# Patient Record
Sex: Male | Born: 1940 | Race: White | Hispanic: No | Marital: Single | Smoking: Never smoker
Health system: Southern US, Community
[De-identification: ages and names within clinical notes are randomized; demographics above are authoritative.]

## PROBLEM LIST (undated history)

## (undated) DIAGNOSIS — E119 Type 2 diabetes mellitus without complications: Secondary | ICD-10-CM

## (undated) DIAGNOSIS — E7800 Pure hypercholesterolemia, unspecified: Secondary | ICD-10-CM

## (undated) DIAGNOSIS — I10 Essential (primary) hypertension: Secondary | ICD-10-CM

## (undated) DIAGNOSIS — C189 Malignant neoplasm of colon, unspecified: Secondary | ICD-10-CM

## (undated) DIAGNOSIS — K635 Polyp of colon: Secondary | ICD-10-CM

## (undated) DIAGNOSIS — L309 Dermatitis, unspecified: Secondary | ICD-10-CM

## (undated) DIAGNOSIS — N189 Chronic kidney disease, unspecified: Secondary | ICD-10-CM

## (undated) DIAGNOSIS — M1990 Unspecified osteoarthritis, unspecified site: Secondary | ICD-10-CM

## (undated) HISTORY — DX: Chronic kidney disease, unspecified: N18.9

## (undated) HISTORY — DX: Dermatitis, unspecified: L30.9

## (undated) HISTORY — DX: Malignant neoplasm of colon, unspecified: C18.9

## (undated) HISTORY — DX: Polyp of colon: K63.5

## (undated) HISTORY — DX: Type 2 diabetes mellitus without complications: E11.9

## (undated) HISTORY — DX: Pure hypercholesterolemia, unspecified: E78.00

## (undated) HISTORY — DX: Unspecified osteoarthritis, unspecified site: M19.90

## (undated) HISTORY — PX: COLON SURGERY: SHX602

## (undated) HISTORY — DX: Essential (primary) hypertension: I10

## (undated) SURGERY — Surgical Case
Anesthesia: *Unknown

---
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
## 2013-09-29 HISTORY — PX: COLONOSCOPY: SHX174

## 2014-03-25 ENCOUNTER — Encounter: Payer: Self-pay | Admitting: Internal Medicine

## 2014-03-25 ENCOUNTER — Ambulatory Visit (INDEPENDENT_AMBULATORY_CARE_PROVIDER_SITE_OTHER): Payer: Medicare Other | Admitting: Internal Medicine

## 2014-03-25 VITALS — BP 116/68 | HR 63 | Temp 97.4°F | Ht 65.0 in | Wt 189.2 lb

## 2014-03-25 DIAGNOSIS — D126 Benign neoplasm of colon, unspecified: Secondary | ICD-10-CM

## 2014-03-25 DIAGNOSIS — I10 Essential (primary) hypertension: Secondary | ICD-10-CM | POA: Insufficient documentation

## 2014-03-25 DIAGNOSIS — M549 Dorsalgia, unspecified: Secondary | ICD-10-CM

## 2014-03-25 DIAGNOSIS — E1122 Type 2 diabetes mellitus with diabetic chronic kidney disease: Secondary | ICD-10-CM | POA: Insufficient documentation

## 2014-03-25 DIAGNOSIS — Z85038 Personal history of other malignant neoplasm of large intestine: Secondary | ICD-10-CM | POA: Insufficient documentation

## 2014-03-25 DIAGNOSIS — D509 Iron deficiency anemia, unspecified: Secondary | ICD-10-CM | POA: Insufficient documentation

## 2014-03-25 DIAGNOSIS — N402 Nodular prostate without lower urinary tract symptoms: Secondary | ICD-10-CM

## 2014-03-25 DIAGNOSIS — E118 Type 2 diabetes mellitus with unspecified complications: Secondary | ICD-10-CM

## 2014-03-25 DIAGNOSIS — E1169 Type 2 diabetes mellitus with other specified complication: Secondary | ICD-10-CM | POA: Insufficient documentation

## 2014-03-25 DIAGNOSIS — E785 Hyperlipidemia, unspecified: Secondary | ICD-10-CM | POA: Insufficient documentation

## 2014-03-25 DIAGNOSIS — K5900 Constipation, unspecified: Secondary | ICD-10-CM | POA: Insufficient documentation

## 2014-03-25 DIAGNOSIS — N182 Chronic kidney disease, stage 2 (mild): Secondary | ICD-10-CM

## 2014-03-25 MED ORDER — SIMVASTATIN 80 MG PO TABS: 80.0000 mg | ORAL_TABLET | Freq: Every day | ORAL | Status: DC

## 2014-03-25 MED ORDER — GLIPIZIDE ER 5 MG PO TB24: 5.0000 mg | ORAL_TABLET | Freq: Every day | ORAL | Status: DC

## 2014-03-25 MED ORDER — LISINOPRIL 5 MG PO TABS: 5.0000 mg | ORAL_TABLET | Freq: Every day | ORAL | Status: DC

## 2014-03-25 MED ORDER — METFORMIN HCL 1000 MG PO TABS: 1000.0000 mg | ORAL_TABLET | Freq: Two times a day (BID) | ORAL | Status: DC

## 2014-03-25 MED ORDER — DOCUSATE SODIUM 100 MG PO CAPS: 100.0000 mg | ORAL_CAPSULE | Freq: Every day | ORAL | Status: DC

## 2014-03-25 NOTE — Progress Notes (Signed)
Patient ID: Justin [name], male   DOB: 05-12-1941, 73 y.o.   MRN: [id number]    Chief Complaint  Patient presents with  . Establish Care    NP  . other    back pain x 2-3 weeks, using Bengay oinment with little relief  . other    need a referral for Oncologist & GI   Allergies not on file  HPI 73 y/o male pt is here to establish care. He was seeing Dr Lennie Odor in [facility] in [place], [place] prior to this and has recently moved to [place]. 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 He also has tubular adenoma with high grade dysplasia on colonoscopy of 02/13/14 and needs a flexible sigmoidoscopy in 3-6 months from 02/13/14. Reviewed note fro Dr Kathleene Hazel, GI in [place] his sister is his POA and he lives with her who has also moved from [place]. She is here this visit. She will be seeing Dr Mariea Clonts to establish care and would like to know if Dr Mariea Clonts could be the patient's PCP as well to make co-ordination of care easier.  He has history of HTN, hyperlipidemia, DM and developmental disability. He would need to establish care with an oncologist in town He has been having discomfort in his back for few weeks. He has been doing a lot of gardening recently. Denies any trauma Had house visit call visit recently and needs dilated eye exam. Does not have an ophthalmologist in town  Review of Systems  Constitutional: Negative for fever, chills, malaise/fatigue and diaphoresis.  HENT: Negative for congestion.   Eyes: Negative for blurred vision, double vision and discharge.  Respiratory: Negative for cough, sputum production, shortness of breath and wheezing.   Cardiovascular: Negative for chest pain, palpitations, orthopnea and leg swelling.  Gastrointestinal: Negative for heartburn, nausea, vomiting, abdominal pain  Genitourinary: Negative for dysuria, urgency, frequency and flank pain.  Musculoskeletal: Negative for falls, joint pain  and myalgias.  Skin: Negative for itching and rash.  Neurological: Negative for dizziness, tingling, focal weakness and headaches.  Psychiatric/Behavioral: Negative for depression   Past Medical History  Diagnosis Date  . Diabetes mellitus without complication   . Hypertension   . Cancer     colon   Past Surgical History  Procedure Laterality Date  . Colon surgery  2014 & 2015    resection of colon & removal of poylp ; Dr Kathleene Hazel, Dr Sherlene Shams  . External ear surgery  1954  . Colonoscopy  09/29/2013   Family History  Problem Relation Age of Onset  . Varicose Veins Mother   . Stroke Mother   . Glaucoma Mother   . Kidney disease Mother   . Hypertension Father   . Heart attack Father   . Cancer Brother     testicular cancer   History   Social History  . Marital Status: Single    Spouse Name: N/A    Number of Children: N/A  . Years of Education: N/A   Occupational History  . Not on file.   Social History Main Topics  . Smoking status: Never Smoker   . Smokeless tobacco: Never Used  . Alcohol Use: No  . Drug Use: No  . Sexual Activity: Not on file   Other Topics Concern  . Not on file   Social History Narrative   Single, lives in 2 stories home with 3 people and 1 dog.     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   No current facility-administered medications on file prior to visit.   Physical exam BP 116/68  Pulse 63  Temp(Src) 97.4 F (36.3 C) (Oral)  Ht 5\' 5"  (1.651 m)  Wt 189 lb 3.2 oz (85.821 kg)  BMI 31.48 kg/m2  SpO2 98%  General- elderly male in no acute distress Head- atraumatic, normocephalic Eyes- PERRLA, EOMI, no pallor, no icterus, no discharge Ears- left ear normal tympanic membrane and normal external ear canal , right ear normal tympanic membrane and normal external ear canal, some cerumen in both ears Neck- no  thyromegaly, no jugular vein distension Cardiovascular- normal s1,s2, no murmurs, normal distal pulses Respiratory- bilateral clear to auscultation, no wheeze, no rhonchi, no crackles Abdomen- bowel sounds present, soft, non tender Musculoskeletal- able to move all 4 extremities, no spinal and paraspinal tenderness, steady gait, no use of assistive device, normal range of motion, no leg edema Neurological- no focal deficit, normal reflexes, normal muscle strength, normal sensation to fine touch and vibration Skin- warm and dry Psychiatry- alert and oriented to person, place and time, normal mood and affect  Assessment/plan  1. Mid back pain Patient has not taken anything for pain. Appears to be musculoskeletal. Advised to take tylenol prn for now for pain, if no improvement will reassess. Rest and prn ice pack also recommended  2. Constipation, unspecified constipation type Colace for now as current regimen  3. Type 2 diabetes mellitus with complication Continue his metformin for now with statin and ACEI - Hemoglobin A1c; Future - Lipid Panel; Future - CMP; Future - Ambulatory referral to Ophthalmology  4. Anemia, iron deficiency Monitor h&h. Continue ferrous sulfate and stool softener - CBC with Differential; Future  5. Essential hypertension, benign Stable. Continue lisinopril 5 mg daiy  6. Other and unspecified hyperlipidemia Continue zocor for now - Lipid Panel; Future  7. Tubular adenoma of colon - Ambulatory referral to Oncology - Ambulatory referral to Gastroenterology  8. Personal history of colon cancer, stage II Oncology and gi referral provided. Reviewed hemeonc note Dr Kris Hartmann 01/13/14. Recent CEA abd ct scan was normal. A repeat recommended in a year and sigmoidoscopy in 3-6 months from 7/15  9. Prostate nodule without urinary obstruction - PSA; Future - might need urology referral next visit  All of his and his sister's questions were answered

## 2014-03-25 NOTE — Patient Instructions (Signed)
Take tylenol 650 mg every 8 hours as need for pain in the back with heat pack. Avoid gardening for now. If no imporvement with rest and tylenol please let us know

## 2014-03-26 ENCOUNTER — Telehealth: Payer: Self-pay | Admitting: Internal Medicine

## 2014-03-26 NOTE — Telephone Encounter (Signed)
Pt sister establish care with Dr. Bubba Camp.  Says she discussed with Dr. Bubba Camp switching care with Dr. Mariea Clonts.  Says she is a patient of Dr. Mariea Clonts as well as her husband. Dr. Bubba Camp and Dr. Mariea Clonts agreed to the  change.  Dr. Bubba Camp also agreed to perform a CPE on pt.  Dr. Mariea Clonts did not have availability on her schedule.    cdavis

## 2014-03-31 ENCOUNTER — Other Ambulatory Visit: Payer: Self-pay | Admitting: *Deleted

## 2014-03-31 ENCOUNTER — Encounter: Payer: Self-pay | Admitting: Gastroenterology

## 2014-03-31 MED ORDER — GLUCOSE BLOOD VI STRP: ORAL_STRIP | Status: DC

## 2014-04-10 ENCOUNTER — Encounter: Payer: Self-pay | Admitting: Gastroenterology

## 2014-04-10 ENCOUNTER — Telehealth: Payer: Self-pay | Admitting: *Deleted

## 2014-04-10 ENCOUNTER — Ambulatory Visit (INDEPENDENT_AMBULATORY_CARE_PROVIDER_SITE_OTHER): Payer: Medicare Other | Admitting: Gastroenterology

## 2014-04-10 VITALS — BP 100/60 | HR 72 | Ht 64.0 in | Wt 186.0 lb

## 2014-04-10 DIAGNOSIS — Z86010 Personal history of colonic polyps: Secondary | ICD-10-CM

## 2014-04-10 DIAGNOSIS — Z85038 Personal history of other malignant neoplasm of large intestine: Secondary | ICD-10-CM

## 2014-04-10 DIAGNOSIS — C189 Malignant neoplasm of colon, unspecified: Secondary | ICD-10-CM

## 2014-04-10 NOTE — Telephone Encounter (Signed)
LMOM to return call.

## 2014-04-10 NOTE — Telephone Encounter (Signed)
There was some cerumen/wax in both ears, i had advised on using debrox ear drop for now

## 2014-04-10 NOTE — Patient Instructions (Addendum)
You have been scheduled for a flexible sigmoidoscopy. Please follow the written instructions given to you at your visit today. If you use inhalers (even only as needed), please bring them with you on the day of your procedure.  

## 2014-04-10 NOTE — Telephone Encounter (Signed)
Patient called and stated that he was suppose to be getting a referral to ENT Dr. For Wax buildup. Please Advise.

## 2014-04-11 ENCOUNTER — Encounter: Payer: Self-pay | Admitting: Gastroenterology

## 2014-04-11 DIAGNOSIS — C189 Malignant neoplasm of colon, unspecified: Secondary | ICD-10-CM | POA: Insufficient documentation

## 2014-04-11 NOTE — Progress Notes (Signed)
Reviewed and agree with management. Khandi Kernes D. Mischelle Reeg, M.D., FACG  

## 2014-04-11 NOTE — Progress Notes (Signed)
04/11/2014 Justin [name] [id number] 08/11/1940   HISTORY OF PRESENT ILLNESS:  This is a 73 year old male with past medical history of hypertension, hyperlipidemia, diabetes, and developmental disability who is new to our practice and recently moved here from [place] to live with his sister. The patient underwent colonoscopy on 06/04/2013, which revealed a large lobular tumor at 45 cm. The mass was nearly obstructing the entire lumen of the bowel and he ended up having a laparoscopic splenic flexure mobilization and laparoscopic converted to an open sigmoid colectomy with repair of incarcerated umbilical hernia on 42/68/3419. 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 14 lymph nodes were examined and 0 were involved. Diagnosed with Stage 2 colon cancer.  He subsequently underwent a colonoscopy on 12/24/2013, which revealed a 10 mm polyp 10 cm from the anus which was biopsied and showed fragments of tubulovillous adenoma with erosion. Colonoscopy was repeated on January 10, 2014 and the same area was identified and measured 10-14 mm, an appearance consistent with villous sessile adenoma. Pathology actually showed fragments of adenoma with focal high-grade dysplasia with no concern for positive margins. The plan per his oncologist and his gastroenterologist in [place] was to proceed with repeat flexible sigmoidoscopy in 3 months from the last. He is here today to establish GI care and to schedule that flexible sigmoidoscopy. He'll also be establishing care with an oncologist as well.  He denies any complaints.      Past Medical History  Diagnosis Date  . Diabetes mellitus without complication   . Hypertension   . Colon cancer   . Hypercholesteremia   . Colon polyps     adenomatous  . Arthritis    Past Surgical History  Procedure Laterality Date  . Colon surgery  2014 & 2015    resection of colon  & removal of poylp ; Dr Kathleene Hazel, Dr Sherlene Shams  . External ear surgery  1954  . Colonoscopy  09/29/2013    reports that he has never smoked. He has never used smokeless tobacco. He reports that he drinks alcohol. He reports that he does not use illicit drugs. family history includes Cancer in his brother; Glaucoma in his mother; Heart attack in his father; Hypertension in his father; Kidney disease in his mother; Stroke in his mother; Varicose Veins in his mother. No Known Allergies    Outpatient Encounter Prescriptions as of 04/10/2014  Medication Sig  . docusate sodium (COLACE) 100 MG capsule Take 1 capsule (100 mg total) by mouth daily.  . ferrous sulfate 325 (65 FE) MG EC tablet Take 65 mg by mouth daily.  Marland Kitchen glipiZIDE (GLUCOTROL XL) 5 MG 24 hr tablet Take 1 tablet (5 mg total) by mouth daily with breakfast.  . glucose blood test strip One Touch Ultra Mini Test Strips Sig:Use as instructed: once daily for Diabetes Dx 250.00  . lisinopril (PRINIVIL,ZESTRIL) 5 MG tablet Take 1 tablet (5 mg total) by mouth daily.  . metFORMIN (GLUCOPHAGE) 1000 MG tablet Take 1 tablet (1,000 mg total) by mouth 2 (two) times daily with a meal.  . simvastatin (ZOCOR) 80 MG tablet Take 1 tablet (80 mg total) by mouth daily. In there evening for hyperlipedemia     REVIEW OF SYSTEMS  : All other systems reviewed and negative except where noted in the History of Present Illness.   PHYSICAL EXAM: BP 100/60  Pulse 72  Ht 5\' 4"  (1.626 m)  Wt 186  lb (84.369 kg)  BMI 31.91 kg/m2 General: Well developed white male in no acute distress Head: Normocephalic and atraumatic Eyes:  Sclerae anicteric, conjunctiva pink. Ears: Normal auditory acuity  Lungs: Clear throughout to auscultation Heart: Regular rate and rhythm Abdomen: Soft, non-distended.  Normal bowel sounds.  Non-tender.  Well healed laparotomy scar noted. Rectal:  Deferred.  Will be done at the time of colonoscopy. Musculoskeletal: Symmetrical with no  gross deformities  Skin: No lesions on visible extremities Extremities: No edema  Neurological: Alert oriented x 4, grossly non-focal Psychological:  Alert and cooperative. Normal mood and affect  ASSESSMENT AND PLAN: -Personal history of colon cancer diagnosed in 06/2013 s/p and personal history of colon polyps with focal high grade dysplasia since that time.  Will schedule flexible sigmoidoscopy at the recommendation of his previous gastroenterologist in [place].  We will give him a Miralax prep due to his inability to give himself enemas due to poor manual dexterity.    *A significant amount of time, > 30 minutes was spent reviewing records.

## 2014-04-14 NOTE — Telephone Encounter (Signed)
Patient Caregiver Notified and will try this.

## 2014-04-18 ENCOUNTER — Encounter: Payer: Self-pay | Admitting: Gastroenterology

## 2014-05-12 ENCOUNTER — Other Ambulatory Visit: Payer: Medicare Other

## 2014-05-12 DIAGNOSIS — E118 Type 2 diabetes mellitus with unspecified complications: Secondary | ICD-10-CM

## 2014-05-12 DIAGNOSIS — E785 Hyperlipidemia, unspecified: Secondary | ICD-10-CM

## 2014-05-12 DIAGNOSIS — D509 Iron deficiency anemia, unspecified: Secondary | ICD-10-CM

## 2014-05-12 DIAGNOSIS — N402 Nodular prostate without lower urinary tract symptoms: Secondary | ICD-10-CM

## 2014-05-13 LAB — CBC WITH DIFFERENTIAL/PLATELET
BASOS: 0 %
Basophils Absolute: 0 10*3/uL (ref 0.0–0.2)
EOS ABS: 0.2 10*3/uL (ref 0.0–0.4)
Eos: 3 %
HCT: 40.8 % (ref 37.5–51.0)
Hemoglobin: 13.6 g/dL (ref 12.6–17.7)
IMMATURE GRANS (ABS): 0 10*3/uL (ref 0.0–0.1)
IMMATURE GRANULOCYTES: 0 %
LYMPHS: 26 %
Lymphocytes Absolute: 1.9 10*3/uL (ref 0.7–3.1)
MCH: 28.8 pg (ref 26.6–33.0)
MCHC: 33.3 g/dL (ref 31.5–35.7)
MCV: 86 fL (ref 79–97)
Monocytes Absolute: 0.8 10*3/uL (ref 0.1–0.9)
Monocytes: 11 %
NEUTROS PCT: 60 %
Neutrophils Absolute: 4.3 10*3/uL (ref 1.4–7.0)
RBC: 4.72 x10E6/uL (ref 4.14–5.80)
RDW: 14.2 % (ref 12.3–15.4)
WBC: 7.3 10*3/uL (ref 3.4–10.8)

## 2014-05-13 LAB — LIPID PANEL
CHOLESTEROL TOTAL: 151 mg/dL (ref 100–199)
Chol/HDL Ratio: 3.6 ratio units (ref 0.0–5.0)
HDL: 42 mg/dL (ref >39)
LDL Calculated: 69 mg/dL (ref 0–99)
TRIGLYCERIDES: 201 mg/dL — AB (ref 0–149)
VLDL CHOLESTEROL CAL: 40 mg/dL (ref 5–40)

## 2014-05-13 LAB — COMPREHENSIVE METABOLIC PANEL
ALK PHOS: 85 IU/L (ref 39–117)
ALT: 19 IU/L (ref 0–44)
AST: 20 IU/L (ref 0–40)
Albumin/Globulin Ratio: 2.2 (ref 1.1–2.5)
Albumin: 4.6 g/dL (ref 3.5–4.8)
BILIRUBIN TOTAL: 0.9 mg/dL (ref 0.0–1.2)
BUN / CREAT RATIO: 20 (ref 10–22)
BUN: 27 mg/dL (ref 8–27)
CO2: 25 mmol/L (ref 18–29)
CREATININE: 1.32 mg/dL — AB (ref 0.76–1.27)
Calcium: 9.4 mg/dL (ref 8.6–10.2)
Chloride: 99 mmol/L (ref 97–108)
GFR calc non Af Amer: 53 mL/min/{1.73_m2} — ABNORMAL LOW (ref >59)
GFR, EST AFRICAN AMERICAN: 61 mL/min/{1.73_m2} (ref >59)
GLOBULIN, TOTAL: 2.1 g/dL (ref 1.5–4.5)
Glucose: 122 mg/dL — ABNORMAL HIGH (ref 65–99)
Potassium: 5.3 mmol/L — ABNORMAL HIGH (ref 3.5–5.2)
Sodium: 137 mmol/L (ref 134–144)
Total Protein: 6.7 g/dL (ref 6.0–8.5)

## 2014-05-13 LAB — HEMOGLOBIN A1C
Est. average glucose Bld gHb Est-mCnc: 140 mg/dL
HEMOGLOBIN A1C: 6.5 % — AB (ref 4.8–5.6)

## 2014-05-13 LAB — PSA: PSA: 4.9 ng/mL — ABNORMAL HIGH (ref 0.0–4.0)

## 2014-05-14 ENCOUNTER — Encounter: Payer: Medicare Other | Admitting: Internal Medicine

## 2014-05-19 ENCOUNTER — Encounter: Payer: Medicare Other | Admitting: Internal Medicine

## 2014-05-30 ENCOUNTER — Encounter: Payer: Self-pay | Admitting: Internal Medicine

## 2014-05-30 ENCOUNTER — Ambulatory Visit (INDEPENDENT_AMBULATORY_CARE_PROVIDER_SITE_OTHER): Payer: Medicare Other | Admitting: Internal Medicine

## 2014-05-30 VITALS — BP 100/60 | HR 74 | Temp 97.9°F | Ht 64.0 in | Wt 192.0 lb

## 2014-05-30 DIAGNOSIS — N402 Nodular prostate without lower urinary tract symptoms: Secondary | ICD-10-CM

## 2014-05-30 DIAGNOSIS — E118 Type 2 diabetes mellitus with unspecified complications: Secondary | ICD-10-CM

## 2014-05-30 DIAGNOSIS — D126 Benign neoplasm of colon, unspecified: Secondary | ICD-10-CM

## 2014-05-30 DIAGNOSIS — Z85038 Personal history of other malignant neoplasm of large intestine: Secondary | ICD-10-CM

## 2014-05-30 DIAGNOSIS — Z0000 Encounter for general adult medical examination without abnormal findings: Secondary | ICD-10-CM

## 2014-05-30 DIAGNOSIS — M549 Dorsalgia, unspecified: Secondary | ICD-10-CM

## 2014-05-30 DIAGNOSIS — D229 Melanocytic nevi, unspecified: Secondary | ICD-10-CM

## 2014-05-30 DIAGNOSIS — I10 Essential (primary) hypertension: Secondary | ICD-10-CM

## 2014-05-30 NOTE — Progress Notes (Signed)
Patient ID: Justin [name], male   DOB: [birth year], 73 y.o.   MRN: [id number]   Location:  [facility]  Code Status: has hcpoa on file  No Known Allergies  Chief Complaint  Patient presents with  . Annual Exam    yearly physical, Discuss labs (Copy printed)  . Immunizations    Discuss having the Flu vaccine    HPI: Patient is a 73 y.o. male seen in the office today for   MMSE was done. Failed clock drawing.  25/30.    Wants ears cleaned out.    Has sigmoidoscopy on Monday.  Last dec, had colon surgery for colon cancer.  Was very slow in coming back to normal mentally and with his GI system.    Has a mole on his back.    No pain.    Right shoulder makes knocking sound.  Review of Systems:  Review of Systems  Constitutional: Negative for fever and chills.  HENT: Positive for hearing loss.        Cerumen impaction  Eyes: Negative for blurred vision.       Has astigmatism and "whiteness" of left eye  Respiratory: Negative for cough and shortness of breath.   Cardiovascular: Negative for chest pain and leg swelling.  Gastrointestinal: Positive for constipation. Negative for abdominal pain.  Genitourinary: Negative for dysuria, urgency and frequency.  Musculoskeletal: Positive for back pain. Negative for falls and myalgias.       Right shoulder pops out of joint  Skin: Negative for rash.       Mole in middle of back  Neurological: Negative for dizziness, loss of consciousness, weakness and headaches.  Endo/Heme/Allergies: Does not bruise/bleed easily.  Psychiatric/Behavioral: Negative for depression and memory loss.       Has developmental delay     Past Medical History  Diagnosis Date  . Diabetes mellitus without complication   . Hypertension   . Colon cancer   . Hypercholesteremia   . Colon polyps     adenomatous  . Arthritis     Past Surgical History  Procedure Laterality Date  . Colon surgery  2014 & 2015   resection of colon & removal of poylp ; Dr Kathleene Hazel, Dr Sherlene Shams  . External ear surgery  1954  . Colonoscopy  09/29/2013    Social History:   reports that he has never smoked. He has never used smokeless tobacco. He reports that he drinks alcohol. He reports that he does not use illicit drugs.  Family History  Problem Relation Age of Onset  . Varicose Veins Mother   . Stroke Mother   . Glaucoma Mother   . Kidney disease Mother   . Hypertension Father   . Heart attack Father   . Cancer Brother     testicular cancer    Medications: Patient's Medications  New Prescriptions   No medications on file  Previous Medications   DOCUSATE SODIUM (COLACE) 100 MG CAPSULE    Take 1 capsule (100 mg total) by mouth daily.   FERROUS SULFATE 325 (65 FE) MG EC TABLET    Take 65 mg by mouth daily.   GLIPIZIDE (GLUCOTROL XL) 5 MG 24 HR TABLET    Take 1 tablet (5 mg total) by mouth daily with breakfast.   GLUCOSE BLOOD TEST STRIP    One Touch Ultra Mini Test Strips Sig:Use as instructed: once daily for Diabetes Dx 250.00   LISINOPRIL (PRINIVIL,ZESTRIL) 5 MG TABLET  Take 1 tablet (5 mg total) by mouth daily.   METFORMIN (GLUCOPHAGE) 1000 MG TABLET    Take 1 tablet (1,000 mg total) by mouth 2 (two) times daily with a meal.   SIMVASTATIN (ZOCOR) 80 MG TABLET    Take 1 tablet (80 mg total) by mouth daily. In there evening for hyperlipedemia  Modified Medications   No medications on file  Discontinued Medications   No medications on file     Physical Exam: Filed Vitals:   05/30/14 1027  BP: 100/60  Pulse: 74  Temp: 97.9 F (36.6 C)  TempSrc: Oral  Height: 5\' 4"  (1.626 m)  Weight: 192 lb (87.091 kg)  SpO2: 97%  Physical Exam  Constitutional: He is oriented to person, place, and time. He appears well-developed and well-nourished. No distress.  HENT:  Head: Normocephalic and atraumatic.  Right Ear: External ear normal.  Left Ear: External ear normal.  Nose: Nose normal.  Mouth/Throat:  Oropharynx is clear and moist. No oropharyngeal exudate.  Had cerumen impaction that was flushed and metal tools used to remove cerumen  Eyes: Conjunctivae and EOM are normal. Pupils are equal, round, and reactive to light.  Neck: Normal range of motion. Neck supple. No JVD present.  Cardiovascular: Normal rate, regular rhythm, normal heart sounds and intact distal pulses.   Varicose veins present bilateral feet  Pulmonary/Chest: Effort normal and breath sounds normal.  Abdominal: Soft. Bowel sounds are normal. He exhibits no distension and no mass. There is no tenderness.  Has scar on abdomen from prior colon surgery  Musculoskeletal: Normal range of motion. He exhibits no edema and no tenderness.  Popping sound during abduction of right shoulder, no pain and no limitation of rom  Neurological: He is alert and oriented to person, place, and time. He has normal reflexes.  Skin: Skin is warm and dry.  Flesh colored raised nevus in middle of back  Psychiatric: He has a normal mood and affect.     Labs reviewed: Basic Metabolic Panel:  Recent Labs  05/12/14 0810  NA 137  K 5.3*  CL 99  CO2 25  GLUCOSE 122*  BUN 27  CREATININE 1.32*  CALCIUM 9.4   Liver Function Tests:  Recent Labs  05/12/14 0810  AST 20  ALT 19  ALKPHOS 85  BILITOT 0.9  PROT 6.7   No results found for this basename: LIPASE, AMYLASE,  in the last 8760 hours No results found for this basename: AMMONIA,  in the last 8760 hours CBC:  Recent Labs  05/12/14 0810  WBC 7.3  NEUTROABS 4.3  HGB 13.6  HCT 40.8  MCV 86   Lipid Panel:  Recent Labs  05/12/14 0810  HDL 42  LDLCALC 69  TRIG 201*  CHOLHDL 3.6   Lab Results  Component Value Date   HGBA1C 6.5* 05/12/2014    Assessment/Plan 1. Tubular adenoma of colon -h/o prior resection -has f/u sigmoidoscopy next monday  2. Personal history of colon cancer, stage II -as above  3. Type 2 diabetes mellitus with complication -has some  nephropathy, it appears vs. CKD -I need his PCP records in order to determine if his renal function has declined  4. Mid back pain -intermittent, may use tylenol for pain  5. Essential hypertension, benign -bp at goal with lisinopril only  6. Nevus -midback--does not appear malignant -would reassess if grows, changes in which case would refer to derm for resection  7. Prostate nodule without urinary obstruction -has elevated PSA, as  well, but I don't know if this is new either -need records from his prior PCP in [place]. Which I do not see any of scanned into his chart (he has switched care to me b/c his sister sees me)  Labs/tests ordered: Orders Placed This Encounter  Procedures  . CBC With differential/Platelet    Standing Status: Future     Number of Occurrences:      Standing Expiration Date: 01/29/2015  . Comprehensive metabolic panel    Standing Status: Future     Number of Occurrences:      Standing Expiration Date: 01/29/2015  . Hemoglobin A1c    Standing Status: Future     Number of Occurrences:      Standing Expiration Date: 01/29/2015  . Lipid panel    Standing Status: Future     Number of Occurrences:      Standing Expiration Date: 01/29/2015    Next appt:  4 mos with labs before  Bettye Sitton L. Mohamedamin Nifong, D.O. [facility] [address], [place] [address] Cell Phone (Mon-Fri 8am-5pm):  [phone] On Call:  [phone] & follow prompts after 5pm & weekends Office Phone:  [phone] Office Fax:  [phone]

## 2014-05-30 NOTE — Progress Notes (Signed)
Pt. Failed clock test.

## 2014-06-09 ENCOUNTER — Other Ambulatory Visit: Payer: Medicare Other | Admitting: Gastroenterology

## 2014-06-19 ENCOUNTER — Other Ambulatory Visit: Payer: Self-pay

## 2014-06-19 ENCOUNTER — Telehealth: Payer: Self-pay

## 2014-06-19 DIAGNOSIS — Z1211 Encounter for screening for malignant neoplasm of colon: Secondary | ICD-10-CM

## 2014-06-19 NOTE — Telephone Encounter (Signed)
I have left message for the patient's sister (Justin [name]) to call back. I have rescheduled his flex sig to 07/21/14 at [place] at 7:15 am

## 2014-06-23 ENCOUNTER — Telehealth: Payer: Self-pay

## 2014-06-23 NOTE — Telephone Encounter (Signed)
Family needs a late morning appointment for the patient's flex sig. Left message for the sister. Need to know if 07/21/14 arrival at 8:45 am at the "[facility] will work for them. I have not cancelled the January appointment pending their decision.

## 2014-06-30 NOTE — Telephone Encounter (Signed)
Left another message to call and confirm the appointment.

## 2014-07-01 NOTE — Telephone Encounter (Signed)
Appointment confirmed

## 2014-07-21 ENCOUNTER — Encounter (HOSPITAL_COMMUNITY): Admission: RE | Payer: Self-pay | Source: Ambulatory Visit

## 2014-07-21 ENCOUNTER — Encounter (HOSPITAL_COMMUNITY)
Admission: RE | Disposition: A | Discharge status: Home / Self Care | Payer: Self-pay | Source: Ambulatory Visit | Attending: Gastroenterology

## 2014-07-21 ENCOUNTER — Encounter (HOSPITAL_COMMUNITY): Payer: Self-pay | Admitting: Gastroenterology

## 2014-07-21 ENCOUNTER — Ambulatory Visit (HOSPITAL_COMMUNITY)
Admission: RE | Admit: 2014-07-21 | Discharge: 2014-07-21 | Disposition: A | Discharge status: Home / Self Care | Payer: Medicare Other | Source: Ambulatory Visit | Attending: Gastroenterology | Admitting: Gastroenterology

## 2014-07-21 ENCOUNTER — Ambulatory Visit (HOSPITAL_COMMUNITY): Admission: RE | Admit: 2014-07-21 | Payer: Medicare Other | Source: Ambulatory Visit | Admitting: Gastroenterology

## 2014-07-21 DIAGNOSIS — E780 Pure hypercholesterolemia: Secondary | ICD-10-CM | POA: Insufficient documentation

## 2014-07-21 DIAGNOSIS — Z85038 Personal history of other malignant neoplasm of large intestine: Secondary | ICD-10-CM | POA: Diagnosis not present

## 2014-07-21 DIAGNOSIS — Z8500 Personal history of malignant neoplasm of unspecified digestive organ: Secondary | ICD-10-CM

## 2014-07-21 DIAGNOSIS — E119 Type 2 diabetes mellitus without complications: Secondary | ICD-10-CM | POA: Insufficient documentation

## 2014-07-21 DIAGNOSIS — I10 Essential (primary) hypertension: Secondary | ICD-10-CM | POA: Diagnosis not present

## 2014-07-21 DIAGNOSIS — Z08 Encounter for follow-up examination after completed treatment for malignant neoplasm: Secondary | ICD-10-CM | POA: Diagnosis not present

## 2014-07-21 HISTORY — PX: FLEXIBLE SIGMOIDOSCOPY: SHX5431

## 2014-07-21 LAB — GLUCOSE, CAPILLARY: Glucose-Capillary: 91 mg/dL (ref 70–99)

## 2014-07-21 SURGERY — SIGMOIDOSCOPY, FLEXIBLE
Anesthesia: Moderate Sedation

## 2014-07-21 MED ORDER — MIDAZOLAM HCL 10 MG/2ML IJ SOLN
INTRAMUSCULAR | Status: AC
  Filled 2014-07-21: qty 2

## 2014-07-21 MED ORDER — SODIUM CHLORIDE 0.9 % IV SOLN: INTRAVENOUS | Status: DC

## 2014-07-21 MED ORDER — FENTANYL CITRATE 0.05 MG/ML IJ SOLN
INTRAMUSCULAR | Status: DC | PRN
  Administered 2014-07-21 (×2): 25 ug via INTRAVENOUS

## 2014-07-21 MED ORDER — MIDAZOLAM HCL 10 MG/2ML IJ SOLN
INTRAMUSCULAR | Status: DC | PRN
  Administered 2014-07-21 (×2): 2 mg via INTRAVENOUS
  Administered 2014-07-21: 1 mg via INTRAVENOUS

## 2014-07-21 MED ORDER — SODIUM CHLORIDE 0.9 % IV SOLN
INTRAVENOUS | Status: DC
  Administered 2014-07-21: 500 mL via INTRAVENOUS

## 2014-07-21 MED ORDER — FENTANYL CITRATE 0.05 MG/ML IJ SOLN
INTRAMUSCULAR | Status: AC
  Filled 2014-07-21: qty 2

## 2014-07-21 NOTE — H&P (Signed)
_                                                                                                                History of Present Illness:  Justin [name] is a 73 year old white male colon cancer and colon polyps here for sigmoidoscopy.  In 2014 a left colon cancer in the descending colon was resected.  5 months later colonoscopy demonstrated flat polyp in the rectosigmoid.  This was removed to the colonoscope.  He is back for a follow-up examination.  Patient has no GI complaints.   Past Medical History  Diagnosis Date  . Diabetes mellitus without complication   . Hypertension   . Colon cancer   . Hypercholesteremia   . Colon polyps     adenomatous  . Arthritis    Past Surgical History  Procedure Laterality Date  . Colon surgery  2014 & 2015    resection of colon & removal of poylp ; Dr Kathleene Hazel, Dr Sherlene Shams  . External ear surgery  1954  . Colonoscopy  09/29/2013   family history includes Cancer in his brother; Glaucoma in his mother; Heart attack in his father; Hypertension in his father; Kidney disease in his mother; Stroke in his mother; Varicose Veins in his mother. Current Facility-Administered Medications  Medication Dose Route Frequency Provider Last Rate Last Dose  . 0.9 %  sodium chloride infusion   Intravenous Continuous Inda Castle, MD 20 mL/hr at 07/21/14 0917 500 mL at 07/21/14 0917  . 0.9 %  sodium chloride infusion   Intravenous Continuous Inda Castle, MD       Allergies as of 06/19/2014  . (No Known Allergies)    reports that he has never smoked. He has never used smokeless tobacco. He reports that he drinks alcohol. He reports that he does not use illicit drugs.   Review of Systems: Pertinent positive and negative review of systems were noted in the above HPI section. All other review of systems were otherwise negative.  Vital signs were reviewed in today's medical record Physical Exam: General: Well developed , well nourished, no  acute distress Skin: anicteric Head: Normocephalic and atraumatic Eyes:  sclerae anicteric, EOMI Ears: Normal auditory acuity Mouth: No deformity or lesions Neck: Supple, no masses or thyromegaly Lungs: Clear throughout to auscultation Heart: Regular rate and rhythm; no murmurs, rubs or bruits Abdomen: Soft, non tender and non distended. No masses, hepatosplenomegaly or hernias noted. Normal Bowel sounds Rectal:deferred Musculoskeletal: Symmetrical with no gross deformities  Skin: No lesions on visible extremities Pulses:  Normal pulses noted Extremities: No clubbing, cyanosis, edema or deformities noted Neurological: Alert oriented x 4, grossly nonfocal Cervical Nodes:  No significant cervical adenopathy Inguinal Nodes: No significant inguinal adenopathy Psychological:  Alert and cooperative. Normal mood and affect  See Assessment and Plan under Problem List  History of colon cancer and tubulovillous adenoma of the rectosigmoid with focal high-grade dysplasia.  Plan sigmoidoscopy to rule  out polyp remnant

## 2014-07-21 NOTE — Discharge Instructions (Signed)
Gastrointestinal Endoscopy, Care After °Refer to this sheet in the next few weeks. These instructions provide you with information on caring for yourself after your procedure. Your caregiver may also give you more specific instructions. Your treatment has been planned according to current medical practices, but problems sometimes occur. Call your caregiver if you have any problems or questions after your procedure. °HOME CARE INSTRUCTIONS °· If you were given medicine to help you relax (sedative), do not drive, operate machinery, or sign important documents for 24 hours. °· Avoid alcohol and hot or warm beverages for the first 24 hours after the procedure. °· Only take over-the-counter or prescription medicines for pain, discomfort, or fever as directed by your caregiver. You may resume taking your normal medicines unless your caregiver tells you otherwise. Ask your caregiver when you may resume taking medicines that may cause bleeding, such as aspirin, clopidogrel, or warfarin. °· You may return to your normal diet and activities on the day after your procedure, or as directed by your caregiver. Walking may help to reduce any bloated feeling in your abdomen. °· Drink enough fluids to keep your urine clear or pale yellow. °· You may gargle with salt water if you have a sore throat. °SEEK IMMEDIATE MEDICAL CARE IF: °· You have severe nausea or vomiting. °· You have severe abdominal pain, abdominal cramps that last longer than 6 hours, or abdominal swelling (distention). °· You have severe shoulder or back pain. °· You have trouble swallowing. °· You have shortness of breath, your breathing is shallow, or you are breathing faster than normal. °· You have a fever or a rapid heartbeat. °· You vomit blood or material that looks like coffee grounds. °· You have bloody, black, or tarry stools. °MAKE SURE YOU: °· Understand these instructions. °· Will watch your condition. °· Will get help right away if you are not doing  well or get worse. °Document Released: 03/01/2004 Document Revised: 12/02/2013 Document Reviewed: 10/18/2011 °ExitCare® Patient Information ©2015 [employer]. This information is not intended to replace advice given to you by your health care provider. Make sure you discuss any questions you have with your health care provider. ° °

## 2014-07-21 NOTE — Op Note (Signed)
[facility] [place], [address]   FLEXIBLE SIGMOIDOSCOPY PROCEDURE REPORT  PATIENT: Justin [name], Justin [name]  MR#: [id number] BIRTHDATE: Nov 27, 1940 , 55  yrs. 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 tubulovillous adenoma with focal dysplasia resected at the rectosigmoid by colonoscopy in June, 2015 MEDICATIONS: Versed 5 mg IV and Fentanyl 50 mcg IV  DESCRIPTION OF PROCEDURE:   After the risks benefits and alternatives of the procedure were thoroughly explained, informed consent was obtained.  Digital exam revealed no abnormalities of the rectum. The     endoscope was introduced through the anus  and advanced to the descending colon , The exam was Without limitations.    The quality of the prep was The overall prep quality was fair. .  The instrument was then slowly withdrawn as the mucosa was fully examined.         COLON FINDINGS: Descending, sigmoid colon, and rectum appeared unremarkable.  the area of prior polypectomy was tattooed and easily identified.  No polyp remnants were seen.  Retroflexed views revealed no abnormalities.    The scope was then withdrawn from the patient and the procedure terminated.  COMPLICATIONS: There were no immediate complications.  ENDOSCOPIC IMPRESSION: Normal sigmoidoscopy  RECOMMENDATIONS: Colonoscopy 6 months  REPEAT EXAM:  eSigned:  Inda Castle, MD 07/21/2014 10:55 AM   CC:

## 2014-07-22 ENCOUNTER — Encounter (HOSPITAL_COMMUNITY): Payer: Self-pay | Admitting: Gastroenterology

## 2014-08-19 ENCOUNTER — Other Ambulatory Visit: Payer: Medicare Other | Admitting: Gastroenterology

## 2014-09-17 ENCOUNTER — Other Ambulatory Visit: Payer: Self-pay | Admitting: Internal Medicine

## 2014-09-29 ENCOUNTER — Ambulatory Visit (INDEPENDENT_AMBULATORY_CARE_PROVIDER_SITE_OTHER): Payer: Medicare Other | Admitting: Internal Medicine

## 2014-09-29 ENCOUNTER — Encounter: Payer: Self-pay | Admitting: Internal Medicine

## 2014-09-29 VITALS — BP 136/74 | HR 84 | Temp 98.2°F | Resp 20 | Ht 67.0 in | Wt 196.0 lb

## 2014-09-29 DIAGNOSIS — J069 Acute upper respiratory infection, unspecified: Secondary | ICD-10-CM | POA: Diagnosis not present

## 2014-09-29 DIAGNOSIS — Z85038 Personal history of other malignant neoplasm of large intestine: Secondary | ICD-10-CM

## 2014-09-29 DIAGNOSIS — H109 Unspecified conjunctivitis: Secondary | ICD-10-CM | POA: Diagnosis not present

## 2014-09-29 DIAGNOSIS — I10 Essential (primary) hypertension: Secondary | ICD-10-CM

## 2014-09-29 DIAGNOSIS — D509 Iron deficiency anemia, unspecified: Secondary | ICD-10-CM

## 2014-09-29 DIAGNOSIS — D126 Benign neoplasm of colon, unspecified: Secondary | ICD-10-CM

## 2014-09-29 DIAGNOSIS — H6123 Impacted cerumen, bilateral: Secondary | ICD-10-CM

## 2014-09-29 DIAGNOSIS — N402 Nodular prostate without lower urinary tract symptoms: Secondary | ICD-10-CM | POA: Diagnosis not present

## 2014-09-29 DIAGNOSIS — E118 Type 2 diabetes mellitus with unspecified complications: Secondary | ICD-10-CM | POA: Diagnosis not present

## 2014-09-29 MED ORDER — DOXYCYCLINE HYCLATE 100 MG PO TABS: 100.0000 mg | ORAL_TABLET | Freq: Two times a day (BID) | ORAL | Status: DC

## 2014-09-29 NOTE — Progress Notes (Signed)
Patient ID: Justin [name], male   DOB: 04-Nov-1940, 74 y.o.   MRN: [id number]   Location:  [facility] [place]   No Known Allergies  Chief Complaint  Patient presents with  . Medical Management of Chronic Issues    drainage from left eye x2 days, cold, runny nose,  . Bleeding/Bruising    HPI: Patient is a 74 y.o. white male seen in the office today for med mgt of chronic diseases and acute concern of drainage from his left eye and head congestion.  Has had left eye drainage for 2 days, no pain.  A little itchy and watery.  Also has had runny nose and stuffy nose.    Is to see Dr. Deatra [name] for colonoscopy in June.  Has a dentist appt for crown in June also.  Diabetes:  Glucose 150-160 usually lately.    Has to get up to urinate 3x at night.  Only has to go a tiny bit.   Continues with constipation.  Not painful.  Usually recently the stools have been runny.    He ate this am so cannot have cholesterol tested today.  Review of Systems:  Review of Systems  Constitutional: Negative for fever, chills and malaise/fatigue.  HENT: Positive for congestion and hearing loss. Negative for sore throat.   Eyes: Positive for discharge and redness. Negative for blurred vision, photophobia and pain.  Respiratory: Negative for shortness of breath.   Cardiovascular: Negative for chest pain and leg swelling.  Gastrointestinal: Negative for abdominal pain and constipation.  Genitourinary: Negative for dysuria.  Musculoskeletal: Negative for falls.  Skin: Negative for rash.  Neurological: Negative for dizziness, weakness and headaches.     Past Medical History  Diagnosis Date  . Diabetes mellitus without complication   . Hypertension   . Colon cancer   . Hypercholesteremia   . Colon polyps     adenomatous  . Arthritis   . Chronic kidney disease     CKD- Stage  II  . Eczema     Past Surgical History  Procedure Laterality Date  . Colon surgery  2014 &  2015    resection of colon & removal of poylp ; Dr Justin [name], Dr Justin [name]  . External ear surgery  1954  . Colonoscopy  09/29/2013  . Flexible sigmoidoscopy N/A 07/21/2014    Procedure: FLEXIBLE SIGMOIDOSCOPY;  Surgeon: Justin Castle, MD;  Location: [facility];  Service: Endoscopy;  Laterality: N/A;    Social History:   reports that he has never smoked. He has never used smokeless tobacco. He reports that he drinks alcohol. He reports that he does not use illicit drugs.  Family History  Problem Relation Age of Onset  . Varicose Veins Mother   . Stroke Mother   . Glaucoma Mother   . Kidney disease Mother   . Hypertension Father   . Heart attack Father   . Cancer Brother     testicular cancer  . Hyperlipidemia Sister   . Meniere's disease Sister     Medications: Patient's Medications  New Prescriptions   No medications on file  Previous Medications   DOCUSATE SODIUM (COLACE) 100 MG CAPSULE    Take 1 capsule (100 mg total) by mouth daily.   FERROUS SULFATE 325 (65 FE) MG EC TABLET    Take 65 mg by mouth daily.   GLIPIZIDE (GLUCOTROL XL) 5 MG 24 HR TABLET    Take 1 tablet (5 mg total) by mouth daily  with breakfast.   GLUCOSE BLOOD TEST STRIP    One Touch Ultra Mini Test Strips Sig:Use as instructed: once daily for Diabetes Dx 250.00   LISINOPRIL (PRINIVIL,ZESTRIL) 5 MG TABLET    TAKE 1 TABLET (5 MG TOTAL) BY MOUTH DAILY.   METFORMIN (GLUCOPHAGE) 1000 MG TABLET    Take 1 tablet (1,000 mg total) by mouth 2 (two) times daily with a meal.   SIMVASTATIN (ZOCOR) 80 MG TABLET    TAKE 1 TABLET (80 MG TOTAL) BY MOUTH DAILY IN THE EVENING FOR HYPERLIPEDEMIA  Modified Medications   No medications on file  Discontinued Medications   No medications on file     Physical Exam: Filed Vitals:   09/29/14 1101  BP: 136/74  Pulse: 84  Temp: 98.2 F (36.8 C)  TempSrc: Oral  Resp: 20  Height: 5\' 7"  (1.702 m)  Weight: 196 lb (88.905 kg)  SpO2: 96%  Physical Exam  Constitutional: He is  oriented to person, place, and time. He appears well-developed and well-nourished. No distress.  HENT:  Right Ear: External ear normal.  Left Ear: External ear normal.  Mouth/Throat: Oropharynx is clear and moist. No oropharyngeal exudate.  Bilateral cerumen impaction; nasal congestion evident  Eyes: EOM are normal. Pupils are equal, round, and reactive to light.  Erythema and yellow drainage of conjunctivae present  Neck: Neck supple.  Cardiovascular: Normal rate, regular rhythm and normal heart sounds.   Pulmonary/Chest: Effort normal and breath sounds normal.  Abdominal: Soft. Bowel sounds are normal.  Musculoskeletal: Normal range of motion.  Lymphadenopathy:    He has no cervical adenopathy.  Neurological: He is alert and oriented to person, place, and time.  Skin:  A couple of excoriations of his right dorsal hand    Labs reviewed: Basic Metabolic Panel:  Recent Labs  05/12/14 0810  NA 137  K 5.3*  CL 99  CO2 25  GLUCOSE 122*  BUN 27  CREATININE 1.32*  CALCIUM 9.4   Liver Function Tests:  Recent Labs  05/12/14 0810  AST 20  ALT 19  ALKPHOS 85  BILITOT 0.9  PROT 6.7   No results for input(s): LIPASE, AMYLASE in the last 8760 hours. No results for input(s): AMMONIA in the last 8760 hours. CBC:  Recent Labs  05/12/14 0810  WBC 7.3  NEUTROABS 4.3  HGB 13.6  HCT 40.8  MCV 86   Lipid Panel:  Recent Labs  05/12/14 0810  CHOL 151  HDL 42  LDLCALC 69  TRIG 201*  CHOLHDL 3.6   Lab Results  Component Value Date   HGBA1C 6.5* 05/12/2014   Assessment/Plan Received records and need to review these.  1. Upper respiratory infection with cough and congestion -due to conjunctivitis, as well, will treat with antibiotics to make sure he improves--doxy bid for 10 days  2. Bilateral conjunctivitis -as above--denies visual changes  3. Type 2 diabetes mellitus with complication - cont current therapy with metformin, glipizide, will review records for  prior results and get some abstracted, f/u labs today as no one brought him ahead for labs - CBC With differential/Platelet - Comprehensive metabolic panel - Hemoglobin A1c -is on ace  4. Tubular adenoma of colon -is to f/u with Dr. Deatra [name] in June for cscope; had sigmoidoscopy in Dec which was normal - CBC With differential/Platelet  5. Essential hypertension, benign -bp at goal with lisinopril  6. Anemia, iron deficiency -cont iron supplement -f/u labs  7. 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 Cerumen impaction, bilateral -ears were flushed today   9. Personal history of colon cancer, stage II - f/u with Dr. Deatra [name] - CBC With differential/Platelet  Labs/tests ordered:  Future labs done Next appt:  4 mos  Ava Tangney L. Jaileen Janelle, D.O. [facility] [address], [place] [address] Cell Phone (Mon-Fri 8am-5pm):  [phone] On Call:  [phone] & follow prompts after 5pm & weekends Office Phone:  [phone] Office Fax:  [phone]

## 2014-09-30 LAB — CBC WITH DIFFERENTIAL
Basophils Absolute: 0 10*3/uL (ref 0.0–0.2)
Basos: 0 %
Eos: 3 %
Eosinophils Absolute: 0.3 10*3/uL (ref 0.0–0.4)
HCT: 42.8 % (ref 37.5–51.0)
Hemoglobin: 14 g/dL (ref 12.6–17.7)
Immature Grans (Abs): 0 10*3/uL (ref 0.0–0.1)
Immature Granulocytes: 0 %
Lymphocytes Absolute: 1.9 10*3/uL (ref 0.7–3.1)
Lymphs: 17 %
MCH: 29.2 pg (ref 26.6–33.0)
MCHC: 32.7 g/dL (ref 31.5–35.7)
MCV: 89 fL (ref 79–97)
Monocytes Absolute: 1.3 10*3/uL — ABNORMAL HIGH (ref 0.1–0.9)
Monocytes: 12 %
Neutrophils Absolute: 7.2 10*3/uL — ABNORMAL HIGH (ref 1.4–7.0)
Neutrophils Relative %: 68 %
RBC: 4.8 x10E6/uL (ref 4.14–5.80)
RDW: 13.6 % (ref 12.3–15.4)
WBC: 10.8 10*3/uL (ref 3.4–10.8)

## 2014-09-30 LAB — COMPREHENSIVE METABOLIC PANEL
ALT: 24 IU/L (ref 0–44)
AST: 18 IU/L (ref 0–40)
Albumin/Globulin Ratio: 1.6 (ref 1.1–2.5)
Albumin: 4.4 g/dL (ref 3.5–4.8)
Alkaline Phosphatase: 102 IU/L (ref 39–117)
BUN/Creatinine Ratio: 24 — ABNORMAL HIGH (ref 10–22)
BUN: 27 mg/dL (ref 8–27)
Bilirubin Total: 0.8 mg/dL (ref 0.0–1.2)
CO2: 21 mmol/L (ref 18–29)
Calcium: 9.3 mg/dL (ref 8.6–10.2)
Chloride: 99 mmol/L (ref 97–108)
Creatinine, Ser: 1.11 mg/dL (ref 0.76–1.27)
GFR calc Af Amer: 75 mL/min/{1.73_m2} (ref >59)
GFR calc non Af Amer: 65 mL/min/{1.73_m2} (ref >59)
Globulin, Total: 2.8 g/dL (ref 1.5–4.5)
Glucose: 132 mg/dL — ABNORMAL HIGH (ref 65–99)
Potassium: 4.8 mmol/L (ref 3.5–5.2)
Sodium: 141 mmol/L (ref 134–144)
Total Protein: 7.2 g/dL (ref 6.0–8.5)

## 2014-09-30 LAB — HEMOGLOBIN A1C
Est. average glucose Bld gHb Est-mCnc: 174 mg/dL
Hgb A1c MFr Bld: 7.7 % — ABNORMAL HIGH (ref 4.8–5.6)

## 2014-10-01 ENCOUNTER — Other Ambulatory Visit: Payer: Self-pay | Admitting: *Deleted

## 2014-10-01 MED ORDER — LINAGLIPTIN 5 MG PO TABS: ORAL_TABLET | ORAL | Status: DC

## 2014-10-01 NOTE — Telephone Encounter (Signed)
Medication faxed to pharmacy due to abnormal labwork.

## 2014-10-02 ENCOUNTER — Telehealth: Payer: Self-pay | Admitting: *Deleted

## 2014-10-02 NOTE — Telephone Encounter (Signed)
The alternatives of the same type of drug are not tolerated with his kidney function.  We do have samples of the tradjenta.  Can't a prior auth be done to help cover the medication?

## 2014-10-02 NOTE — Telephone Encounter (Signed)
Emelia Salisbury, sister called and stated that patient cannot afford the Tradjenta at $200.00/month and wants the medication changed to something else. Sister stated that patient does eat things he isn't suppose to like bread. Please Advise.

## 2014-10-03 NOTE — Telephone Encounter (Signed)
Called patient insurance [phone] to obtain Prior Auth. Spoke with Darrall Dears and she stated that patient does not require a prior [place]. Explained that medication is over $200 for patient. She stated that I can initiate a tier reduction. Initiated and is in clinical review. Member ID: [id number] Informed Emelia Salisbury.

## 2014-10-03 NOTE — Telephone Encounter (Signed)
[facility] Approved for a tier cost sharing exception at the tier 3 copay through 08/01/2015. Justin [name] Notified.

## 2014-10-03 NOTE — Telephone Encounter (Signed)
Samples left up front for patient to pick up until determination.

## 2014-10-06 ENCOUNTER — Telehealth: Payer: Self-pay | Admitting: *Deleted

## 2014-10-06 MED ORDER — SAXAGLIPTIN HCL 5 MG PO TABS
ORAL_TABLET | ORAL | Status: DC
Start: 2014-10-06 — End: 2015-02-25

## 2014-10-06 NOTE — Telephone Encounter (Signed)
Called and received Tier Reduction for patient's Tradjenta but still copay going to be $150.46. Still too expensive. Dr. Mariea Clonts changing to Onglyza 5mg  Once daily with largest meal. Larena Glassman Notified and faxed to pharmacy.

## 2014-10-13 ENCOUNTER — Ambulatory Visit (INDEPENDENT_AMBULATORY_CARE_PROVIDER_SITE_OTHER): Payer: Medicare Other | Admitting: Nurse Practitioner

## 2014-10-13 ENCOUNTER — Encounter: Payer: Self-pay | Admitting: Nurse Practitioner

## 2014-10-13 VITALS — BP 138/64 | HR 83 | Temp 96.8°F | Resp 20 | Ht 67.0 in | Wt 189.6 lb

## 2014-10-13 DIAGNOSIS — K5900 Constipation, unspecified: Secondary | ICD-10-CM

## 2014-10-13 DIAGNOSIS — R300 Dysuria: Secondary | ICD-10-CM | POA: Diagnosis not present

## 2014-10-13 LAB — POCT URINALYSIS DIPSTICK
Bilirubin, UA: NEGATIVE
Glucose, UA: NEGATIVE
Nitrite, UA: NEGATIVE
PH UA: 6
PROTEIN UA: NEGATIVE
RBC UA: NEGATIVE
SPEC GRAV UA: 1.025
Urobilinogen, UA: 0.2

## 2014-10-13 NOTE — Patient Instructions (Signed)
To get colace 100 mg OTC- may take 1-2 times daily for constipation Increase water and activity  Follow up sooner than regular appt if this does not help your constipation Also see below for more information   Constipation Constipation is when a person has fewer than three bowel movements a week, has difficulty having a bowel movement, or has stools that are dry, hard, or larger than normal. As people grow older, constipation is more common. If you try to fix constipation with medicines that make you have a bowel movement (laxatives), the problem may get worse. Long-term laxative use may cause the muscles of the colon to become weak. A low-fiber diet, not taking in enough fluids, and taking certain medicines may make constipation worse.  CAUSES   Certain medicines, such as antidepressants, pain medicine, iron supplements, antacids, and water pills.   Certain diseases, such as diabetes, irritable bowel syndrome (IBS), thyroid disease, or depression.   Not drinking enough water.   Not eating enough fiber-rich foods.   Stress or travel.   Lack of physical activity or exercise.   Ignoring the urge to have a bowel movement.   Using laxatives too much.  SIGNS AND SYMPTOMS   Having fewer than three bowel movements a week.   Straining to have a bowel movement.   Having stools that are hard, dry, or larger than normal.   Feeling full or bloated.   Pain in the lower abdomen.   Not feeling relief after having a bowel movement.  DIAGNOSIS  Your health care provider will take a medical history and perform a physical exam. Further testing may be done for severe constipation. Some tests may include:  A barium enema X-ray to examine your rectum, colon, and, sometimes, your small intestine.   A sigmoidoscopy to examine your lower colon.   A colonoscopy to examine your entire colon. TREATMENT  Treatment will depend on the severity of your constipation and what is  causing it. Some dietary treatments include drinking more fluids and eating more fiber-rich foods. Lifestyle treatments may include regular exercise. If these diet and lifestyle recommendations do not help, your health care provider may recommend taking over-the-counter laxative medicines to help you have bowel movements. Prescription medicines may be prescribed if over-the-counter medicines do not work.  HOME CARE INSTRUCTIONS   Eat foods that have a lot of fiber, such as fruits, vegetables, whole grains, and beans.  Limit foods high in fat and processed sugars, such as french fries, hamburgers, cookies, candies, and soda.   A fiber supplement may be added to your diet if you cannot get enough fiber from foods.   Drink enough fluids to keep your urine clear or pale yellow.   Exercise regularly or as directed by your health care provider.   Go to the restroom when you have the urge to go. Do not hold it.   Only take over-the-counter or prescription medicines as directed by your health care provider. Do not take other medicines for constipation without talking to your health care provider first.  [place] IF:   You have bright red blood in your stool.   Your constipation lasts for more than 4 days or gets worse.   You have abdominal or rectal pain.   You have thin, pencil-like stools.   You have unexplained weight loss. MAKE SURE YOU:   Understand these instructions.  Will watch your condition.  Will get help right away if you are not doing well or  get worse. Document Released: 04/15/2004 Document Revised: 07/23/2013 Document Reviewed: 04/29/2013 [facility] Patient Information 2015 [place], [place]. This information is not intended to replace advice given to you by your health care provider. Make sure you discuss any questions you have with your health care provider.

## 2014-10-13 NOTE — Progress Notes (Signed)
Patient ID: Justin [name], male   DOB: 1941/06/12, 74 y.o.   MRN: [id number]    PCP: Hollace Kinnier, DO  No Known Allergies  Chief Complaint  Patient presents with  . Acute Visit    Trouble urinating and constipation. Patient says its been ongoing for 3 to 4 days and it hurts his stomach     HPI: Patient is a 74 y.o. male seen in the office today due to constipations. Did not have stool for 3-4 days but had a bowel movement. Had to strain to have a bowel movement. Reports he is taking a stool softener daily. Drinks 1-2 glasses of water a day. Has not had this problem before.  Hx of BPH and prostate nodule does not report changes to urinary stream but does report increased urination.  Reports he had painful urination but this has gone away. Also reports abdominal pain but this has subsided as well.  No fevers or chills.  Review of Systems:  Review of Systems  Constitutional: Negative for activity change, appetite change, fatigue and unexpected weight change.  HENT: Positive for hearing loss.   Respiratory: Negative for cough and shortness of breath.   Cardiovascular: Negative for chest pain, palpitations and leg swelling.  Gastrointestinal: Positive for constipation and rectal pain. Negative for abdominal pain and diarrhea.  Genitourinary: Positive for frequency. Negative for dysuria and difficulty urinating.  Musculoskeletal: Negative for arthralgias.  Neurological: Negative for dizziness, weakness, light-headedness and headaches.    Past Medical History  Diagnosis Date  . Diabetes mellitus without complication   . Hypertension   . Colon cancer   . Hypercholesteremia   . Colon polyps     adenomatous  . Arthritis   . Chronic kidney disease     CKD- Stage  II  . Eczema    Past Surgical History  Procedure Laterality Date  . Colon surgery  2014 & 2015    resection of colon & removal of poylp ; Dr Kathleene Hazel, Dr Sherlene Shams  . External ear surgery  1954  . Colonoscopy  09/29/2013    . Flexible sigmoidoscopy N/A 07/21/2014    Procedure: FLEXIBLE SIGMOIDOSCOPY;  Surgeon: Inda Castle, MD;  Location: [facility];  Service: Endoscopy;  Laterality: N/A;   Social History:   reports that he has never smoked. He has never used smokeless tobacco. He reports that he drinks alcohol. He reports that he does not use illicit drugs.  Family History  Problem Relation Age of Onset  . Varicose Veins Mother   . Stroke Mother   . Glaucoma Mother   . Kidney disease Mother   . Hypertension Father   . Heart attack Father   . Cancer Brother     testicular cancer  . Hyperlipidemia Sister   . Meniere's disease Sister     Medications: Patient's Medications  New Prescriptions   No medications on file  Previous Medications   DOCUSATE SODIUM (COLACE) 100 MG CAPSULE    Take 1 capsule (100 mg total) by mouth daily.   DOXYCYCLINE (VIBRA-TABS) 100 MG TABLET    Take 1 tablet (100 mg total) by mouth 2 (two) times daily.   FERROUS SULFATE 325 (65 FE) MG EC TABLET    Take 65 mg by mouth daily.   GLIPIZIDE (GLUCOTROL XL) 5 MG 24 HR TABLET    Take 1 tablet (5 mg total) by mouth daily with breakfast.   GLUCOSE BLOOD TEST STRIP    One Touch Ultra Mini Test Strips Sig:Use as  instructed: once daily for Diabetes Dx 250.00   LISINOPRIL (PRINIVIL,ZESTRIL) 5 MG TABLET    TAKE 1 TABLET (5 MG TOTAL) BY MOUTH DAILY.   METFORMIN (GLUCOPHAGE) 1000 MG TABLET    Take 1 tablet (1,000 mg total) by mouth 2 (two) times daily with a meal.   SAXAGLIPTIN HCL (ONGLYZA) 5 MG TABS TABLET    Take one tablet by mouth once daily with largest meal to control blood sugar.   SIMVASTATIN (ZOCOR) 80 MG TABLET    TAKE 1 TABLET (80 MG TOTAL) BY MOUTH DAILY IN THE EVENING FOR HYPERLIPEDEMIA  Modified Medications   No medications on file  Discontinued Medications   No medications on file     Physical Exam:  Filed Vitals:   10/13/14 1357  BP: 138/64  Pulse: 83  Temp: 96.8 F (36 C)  TempSrc: Oral  Resp: 20  Height:  5\' 7"  (1.702 m)  Weight: 189 lb 9.6 oz (86.002 kg)  SpO2: 93%    Physical Exam  Constitutional: He is oriented to person, place, and time. He appears well-developed and well-nourished. No distress.  Cardiovascular: Normal rate, regular rhythm and normal heart sounds.   Pulmonary/Chest: Effort normal and breath sounds normal.  Abdominal: Soft. Bowel sounds are normal. There is no tenderness.  Obese rounded abdomen, soft  Genitourinary: Rectum normal, prostate normal and penis normal.  Neurological: He is alert and oriented to person, place, and time.  Skin:  A couple of excoriations of his right dorsal hand    Labs reviewed: Basic Metabolic Panel:  Recent Labs  05/12/14 0810 09/29/14 1159  NA 137 141  K 5.3* 4.8  CL 99 99  CO2 25 21  GLUCOSE 122* 132*  BUN 27 27  CREATININE 1.32* 1.11  CALCIUM 9.4 9.3   Liver Function Tests:  Recent Labs  05/12/14 0810 09/29/14 1159  AST 20 18  ALT 19 24  ALKPHOS 85 102  BILITOT 0.9 0.8  PROT 6.7 7.2   No results for input(s): LIPASE, AMYLASE in the last 8760 hours. No results for input(s): AMMONIA in the last 8760 hours. CBC:  Recent Labs  05/12/14 0810 09/29/14 1159  WBC 7.3 10.8  NEUTROABS 4.3 7.2*  HGB 13.6 14.0  HCT 40.8 42.8  MCV 86 89   Lipid Panel:  Recent Labs  05/12/14 0810  CHOL 151  HDL 42  LDLCALC 69  TRIG 201*  CHOLHDL 3.6   TSH: No results for input(s): TSH in the last 8760 hours. A1C: Lab Results  Component Value Date   HGBA1C 7.7* 09/29/2014     Assessment/Plan  1. Dysuria -reports he thinks this problem is better  - POCT urinalysis dipstick done and was unremarkable  -encouraged to increase hyration  2. Constipation, unspecified constipation type -to increase activity and hydration - to increase colace to twice daily -may use miralax 17 gm in 8 oz of fluid daily as needed for constipation   To follow up sooner than routine visit if problem persist.

## 2014-11-05 ENCOUNTER — Encounter: Payer: Self-pay | Admitting: *Deleted

## 2014-11-07 ENCOUNTER — Encounter: Payer: Self-pay | Admitting: Gastroenterology

## 2014-12-16 ENCOUNTER — Telehealth: Payer: Self-pay | Admitting: Gastroenterology

## 2014-12-17 ENCOUNTER — Other Ambulatory Visit: Payer: Self-pay

## 2014-12-17 ENCOUNTER — Other Ambulatory Visit: Payer: Self-pay | Admitting: Internal Medicine

## 2014-12-17 MED ORDER — NA SULFATE-K SULFATE-MG SULF 17.5-3.13-1.6 GM/177ML PO SOLN: ORAL | Status: DC

## 2014-12-17 NOTE — Telephone Encounter (Signed)
Spoke with the sister Albertha Ghee. Patient is scheduled for 01/14/15 at [facility]. He will arrive at 11:00 to admitting for a 12:30 pm procedure. Instructions mailed to home. Amb.GI referral sent.

## 2015-01-05 ENCOUNTER — Other Ambulatory Visit: Payer: Self-pay | Admitting: Internal Medicine

## 2015-01-06 ENCOUNTER — Other Ambulatory Visit: Payer: Self-pay | Admitting: Internal Medicine

## 2015-01-13 ENCOUNTER — Telehealth: Payer: Self-pay | Admitting: Gastroenterology

## 2015-01-13 ENCOUNTER — Other Ambulatory Visit: Payer: Self-pay

## 2015-01-13 DIAGNOSIS — Z86010 Personal history of colonic polyps: Secondary | ICD-10-CM

## 2015-01-13 NOTE — Telephone Encounter (Signed)
Rescheduled the patient to 03/10/15 at 12:30. This works for the sister's schedule.

## 2015-01-14 ENCOUNTER — Encounter (HOSPITAL_COMMUNITY): Admission: RE | Payer: Self-pay | Source: Ambulatory Visit

## 2015-01-14 ENCOUNTER — Ambulatory Visit (HOSPITAL_COMMUNITY): Admission: RE | Admit: 2015-01-14 | Payer: Medicare Other | Source: Ambulatory Visit | Admitting: Gastroenterology

## 2015-01-14 SURGERY — COLONOSCOPY
Anesthesia: Moderate Sedation

## 2015-01-29 ENCOUNTER — Ambulatory Visit: Payer: Medicare Other | Admitting: Internal Medicine

## 2015-02-09 ENCOUNTER — Encounter: Payer: Self-pay | Admitting: Internal Medicine

## 2015-02-09 ENCOUNTER — Ambulatory Visit (INDEPENDENT_AMBULATORY_CARE_PROVIDER_SITE_OTHER): Payer: Medicare Other | Admitting: Internal Medicine

## 2015-02-09 VITALS — BP 128/80 | HR 74 | Temp 97.3°F | Resp 20 | Ht 67.0 in | Wt 189.0 lb

## 2015-02-09 DIAGNOSIS — Z85038 Personal history of other malignant neoplasm of large intestine: Secondary | ICD-10-CM

## 2015-02-09 DIAGNOSIS — N138 Other obstructive and reflux uropathy: Secondary | ICD-10-CM

## 2015-02-09 DIAGNOSIS — L22 Diaper dermatitis: Secondary | ICD-10-CM | POA: Diagnosis not present

## 2015-02-09 DIAGNOSIS — R197 Diarrhea, unspecified: Secondary | ICD-10-CM | POA: Diagnosis not present

## 2015-02-09 DIAGNOSIS — I10 Essential (primary) hypertension: Secondary | ICD-10-CM | POA: Diagnosis not present

## 2015-02-09 DIAGNOSIS — D369 Benign neoplasm, unspecified site: Secondary | ICD-10-CM | POA: Diagnosis not present

## 2015-02-09 DIAGNOSIS — N401 Enlarged prostate with lower urinary tract symptoms: Secondary | ICD-10-CM | POA: Diagnosis not present

## 2015-02-09 DIAGNOSIS — N403 Nodular prostate with lower urinary tract symptoms: Principal | ICD-10-CM

## 2015-02-09 DIAGNOSIS — R350 Frequency of micturition: Secondary | ICD-10-CM

## 2015-02-09 DIAGNOSIS — E118 Type 2 diabetes mellitus with unspecified complications: Secondary | ICD-10-CM | POA: Diagnosis not present

## 2015-02-09 DIAGNOSIS — H6122 Impacted cerumen, left ear: Secondary | ICD-10-CM | POA: Diagnosis not present

## 2015-02-09 MED ORDER — BARRIER CREAM NON-SPECIFIED: 1.0000 "application " | TOPICAL_CREAM | Freq: Two times a day (BID) | TOPICAL | Status: DC | PRN

## 2015-02-09 NOTE — Progress Notes (Signed)
Patient ID: Justin [name], male   DOB: [birth year], 74 y.o.   MRN: [id number]   Location:  [facility]   Goals of Care: Advanced Directive information Does patient have an advance directive?: Yes, Type of Advance Directive: [place], Does patient want to make changes to advanced directive?: No - Patient declined   Chief Complaint  Patient presents with  . Medical Management of Chronic Issues    4 month follow-up    HPI: Patient is a 74 y.o. white male seen in the office today for med mgt of chronic diseases including his diabetes, colon cancer in remission, hyperlipidemia, adenomatous polyps, CKD II and eczema.    He also has recurrent cerumen impactions.  He is very HOH as a result today and I must speak very loudly with him.    Says he's had urinary frequency and bowel frequency at night.   Has another cscope 03/10/15.  1215, 230, 430. Also peeing and pooping at same time throughout the day.  No dysuria, but has burning pain in his privates, groin at times, and his bottom.    Says his sister asked about his flu shot.  We do not have down that he got one last year, and we don't have them for 2016 just yet.   Review of Systems:  Review of Systems  Constitutional: Negative for fever, chills and malaise/fatigue.  HENT: Positive for hearing loss. Negative for congestion.        Ear wax  Eyes: Negative for blurred vision.  Respiratory: Negative for shortness of breath.   Cardiovascular: Negative for chest pain and leg swelling.  Gastrointestinal: Positive for diarrhea. Negative for nausea, vomiting, abdominal pain, constipation, blood in stool and melena.  Genitourinary: Positive for frequency. Negative for dysuria, urgency, hematuria and flank pain.       Nocturia  Musculoskeletal: Negative for falls.  Skin: Negative for rash.  Neurological: Negative for dizziness, loss of consciousness and weakness.    Psychiatric/Behavioral: Negative for depression and memory loss.       Developmental disability    Past Medical History  Diagnosis Date  . Diabetes mellitus without complication   . Hypertension   . Colon cancer     2014  . Hypercholesteremia   . Colon polyps     adenomatous  . Arthritis   . Chronic kidney disease     CKD- Stage  II  . Eczema     Past Surgical History  Procedure Laterality Date  . Colon surgery  2014 & 2015    resection of colon & removal of poylp ; Dr Kathleene Hazel, Dr Sherlene Shams  . External ear surgery  1954  . Colonoscopy  09/29/2013  . Flexible sigmoidoscopy N/A 07/21/2014    Procedure: FLEXIBLE SIGMOIDOSCOPY;  Surgeon: Inda Castle, MD;  Location: [facility];  Service: Endoscopy;  Laterality: N/A;    No Known Allergies Medications: Patient's Medications  New Prescriptions   No medications on file  Previous Medications   DOCUSATE SODIUM (COLACE) 100 MG CAPSULE    Take 1 capsule (100 mg total) by mouth daily.   FERROUS SULFATE 325 (65 FE) MG EC TABLET    Take 65 mg by mouth daily.   GLIPIZIDE (GLUCOTROL XL) 5 MG 24 HR TABLET    Take 1 tablet (5 mg total) by mouth daily with breakfast.   GLUCOSE BLOOD TEST STRIP    One Touch Ultra Mini Test Strips Sig:Use as instructed: once  daily for Diabetes Dx 250.00   LISINOPRIL (PRINIVIL,ZESTRIL) 5 MG TABLET    TAKE 1 TABLET BY MOUTH DAILY   METFORMIN (GLUCOPHAGE) 1000 MG TABLET    TAKE 1 TABLET BY MOUTH 2 TIMES DAILY WITH A MEAL.   NA SULFATE-K SULFATE-MG SULF SOLN    Use 1 kit as directed for colonoscopy.   SAXAGLIPTIN HCL (ONGLYZA) 5 MG TABS TABLET    Take one tablet by mouth once daily with largest meal to control blood sugar.   SIMVASTATIN (ZOCOR) 80 MG TABLET    TAKE 1 TABLET BY MOUTH DAILY IN THE EVENING FOR HYPERLIPEDEMIA  Modified Medications   No medications on file  Discontinued Medications   SIMVASTATIN (ZOCOR) 80 MG TABLET    TAKE 1 TABLET BY MOUTH DAILY IN THE EVENING FOR HYPERLIPEDEMIA     Physical Exam: Filed Vitals:   02/09/15 1057  BP: 128/80  Pulse: 74  Temp: 97.3 F (36.3 C)  TempSrc: Oral  Resp: 20  Height: 5' 7" (1.702 m)  Weight: 189 lb (85.73 kg)  SpO2: 92%   Physical Exam  Constitutional: He is oriented to person, place, and time. He appears well-developed and well-nourished. No distress.  disheveled  Cardiovascular: Normal rate, regular rhythm, normal heart sounds and intact distal pulses.   Pulmonary/Chest: Effort normal and breath sounds normal. No respiratory distress.  Abdominal: Soft. Bowel sounds are normal. He exhibits no distension. There is no tenderness.  Genitourinary: Rectum normal and penis normal. Guaiac negative stool. No penile tenderness.  Exam performed with CMA present;  Gluteal crease erythema; no external hemorrhoids seen; DRE showed enlargement of prostate and some pain during exam  Neurological: He is alert and oriented to person, place, and time.  Skin: Skin is warm and dry.  Psychiatric: He has a normal mood and affect.  Developmental disability; pleasant man, needs direct instructions    Labs reviewed: Basic Metabolic Panel:  Recent Labs  05/12/14 0810 09/29/14 1159  NA 137 141  K 5.3* 4.8  CL 99 99  CO2 25 21  GLUCOSE 122* 132*  BUN 27 27  CREATININE 1.32* 1.11  CALCIUM 9.4 9.3   Liver Function Tests:  Recent Labs  05/12/14 0810 09/29/14 1159  AST 20 18  ALT 19 24  ALKPHOS 85 102  BILITOT 0.9 0.8  PROT 6.7 7.2   No results for input(s): LIPASE, AMYLASE in the last 8760 hours. No results for input(s): AMMONIA in the last 8760 hours. CBC:  Recent Labs  05/12/14 0810 09/29/14 1159  WBC 7.3 10.8  NEUTROABS 4.3 7.2*  HGB 13.6 14.0  HCT 40.8 42.8  MCV 86 89   Lipid Panel:  Recent Labs  05/12/14 0810  CHOL 151  HDL 42  LDLCALC 69  TRIG 201*  CHOLHDL 3.6   Lab Results  Component Value Date   HGBA1C 7.7* 09/29/2014   Assessment/Plan 1. Prostate nodule with urinary obstruction -  suspect this is worsening, needs to f/u with urology due to increased frequency - Ambulatory referral to Urology - PSA  2. Urinary frequency -progressing, f/u with his urologist - Ambulatory referral to Urology  3. Frequent loose stools -needs to keep appt for cscope as scheduled b/c this could be colon cancer recurrence due to change in bowel habits -he is having loose stools that have led to diaper rash in his gluteal crease  4. Type 2 diabetes mellitus with complication - stable, cont onglyza, metformin, glipizide and lisinopril -f/u labs done today to assess further: -  Hemoglobin O1Y - Basic metabolic panel  5. Essential hypertension, benign -bp at goal with lisinopril - Basic metabolic panel  6. Personal history of colon cancer, stage II -had prior partial colectomy for colon ca and is for f/u cscope upcoming due to #7, needs to keep appt  7. Adenomatous polyps -DRE today was negative for blood -keep appt for cscope  8. Diaper rash - due to frequent bms recently making bottom sore - advised to use cream twice daily to protect skin in gluteal crease after cleaning up from bms - barrier cream (NON-SPECIFIED) CREA; Apply 1 application topically 2 (two) times daily as needed (for pain in bottom (apply after cleaning up from [facility])).  Dispense: 1 each; Refill: 3  9. Cerumen impaction, left - has recurrently and previously recommended debrox drop use  -is much better overall than usual--typically both ears are full of wax, primarily left this time and did come out with flushing only by CMA today--no instrumentation was needed - Ear wax removal  COPY OF AVS was sent to his sister and POA for her information and to make sure he follows through on all needs.  May require her encouragement to do this.  Labs/tests ordered: Orders Placed This Encounter  Procedures  . Hemoglobin A1c  . PSA  . Basic metabolic panel  . Ambulatory referral to Urology    Referral Priority:  Routine     Referral Type:  Consultation    Referral Reason:  Specialty Services Required    Requested Specialty:  Urology    Number of Visits Requested:  1  . Ear wax removal    Next appt:  3 mos  Tiffany L. Reed, D.O. [facility] [address],  [address] Cell Phone (Mon-Fri 8am-5pm):  [phone] On Call:  [phone] & follow prompts after 5pm & weekends Office Phone:  [phone] Office Fax:  [phone]

## 2015-02-09 NOTE — Patient Instructions (Signed)
It is very important that you keep your colonoscopy appointment especially with your frequent bowel movements.  Your stool today was negative for blood.    I did not see any problems with your penis or scrotum today, but did notice a lot of redness in your gluteal crease (crack).  I recommend you use barrier cream (zinc oxide) twice daily after you have a bm and clean yourself up.    I also would like you to see a urologist about your prostate nodule you previously were noted to have and your increased urinary frequency.    I checked your labs today including your PSA, hba1c, and bmp.

## 2015-02-10 LAB — HEMOGLOBIN A1C
Est. average glucose Bld gHb Est-mCnc: 126 mg/dL
Hgb A1c MFr Bld: 6 % — ABNORMAL HIGH (ref 4.8–5.6)

## 2015-02-10 LAB — BASIC METABOLIC PANEL
BUN/Creatinine Ratio: 25 — ABNORMAL HIGH (ref 10–22)
BUN: 34 mg/dL — ABNORMAL HIGH (ref 8–27)
CO2: 23 mmol/L (ref 18–29)
Calcium: 9.7 mg/dL (ref 8.6–10.2)
Chloride: 99 mmol/L (ref 97–108)
Creatinine, Ser: 1.35 mg/dL — ABNORMAL HIGH (ref 0.76–1.27)
GFR calc Af Amer: 59 mL/min/{1.73_m2} — ABNORMAL LOW (ref >59)
GFR calc non Af Amer: 51 mL/min/{1.73_m2} — ABNORMAL LOW (ref >59)
Glucose: 71 mg/dL (ref 65–99)
Potassium: 5.4 mmol/L — ABNORMAL HIGH (ref 3.5–5.2)
Sodium: 140 mmol/L (ref 134–144)

## 2015-02-10 LAB — PSA: Prostate Specific Ag, Serum: 6.3 ng/mL — ABNORMAL HIGH (ref 0.0–4.0)

## 2015-02-17 ENCOUNTER — Other Ambulatory Visit: Payer: Self-pay | Admitting: Internal Medicine

## 2015-02-25 ENCOUNTER — Other Ambulatory Visit: Payer: Self-pay | Admitting: Internal Medicine

## 2015-03-09 ENCOUNTER — Encounter (HOSPITAL_COMMUNITY): Payer: Self-pay | Admitting: *Deleted

## 2015-03-09 NOTE — Progress Notes (Addendum)
Spoke with Justin [name] in Dr. Kelby Fam office to inform that  We have still been unable to reach [name] to give any pre procedure instructions to review the medical, surgical histories, review the home medications, NPO after 03-09-15 and give the arrival time and report to Admitting for registration.  I spoke with Linna Hoff in the  Endoscopy department to make him aware of the situation.

## 2015-03-09 NOTE — Progress Notes (Signed)
Unable to reach after four attempts to obtain medical and surgical histories, discuss any home am medications  that he may need to take pre procedure,NPO after NM, give  instructions on arrival time and  to report to Admitting at [facility] for registration.

## 2015-03-10 ENCOUNTER — Ambulatory Visit (HOSPITAL_COMMUNITY): Payer: Medicare Other | Admitting: Anesthesiology

## 2015-03-10 ENCOUNTER — Encounter (HOSPITAL_COMMUNITY)
Admission: RE | Disposition: A | Discharge status: Home / Self Care | Payer: Self-pay | Source: Ambulatory Visit | Attending: Gastroenterology

## 2015-03-10 ENCOUNTER — Ambulatory Visit (HOSPITAL_COMMUNITY)
Admission: RE | Admit: 2015-03-10 | Discharge: 2015-03-10 | Disposition: A | Discharge status: Home / Self Care | Payer: Medicare Other | Source: Ambulatory Visit | Attending: Gastroenterology | Admitting: Gastroenterology

## 2015-03-10 ENCOUNTER — Encounter (HOSPITAL_COMMUNITY): Payer: Self-pay | Admitting: *Deleted

## 2015-03-10 DIAGNOSIS — Z809 Family history of malignant neoplasm, unspecified: Secondary | ICD-10-CM | POA: Insufficient documentation

## 2015-03-10 DIAGNOSIS — I129 Hypertensive chronic kidney disease with stage 1 through stage 4 chronic kidney disease, or unspecified chronic kidney disease: Secondary | ICD-10-CM | POA: Insufficient documentation

## 2015-03-10 DIAGNOSIS — Z79899 Other long term (current) drug therapy: Secondary | ICD-10-CM | POA: Insufficient documentation

## 2015-03-10 DIAGNOSIS — Z86010 Personal history of colonic polyps: Secondary | ICD-10-CM | POA: Diagnosis not present

## 2015-03-10 DIAGNOSIS — E119 Type 2 diabetes mellitus without complications: Secondary | ICD-10-CM | POA: Diagnosis not present

## 2015-03-10 DIAGNOSIS — E780 Pure hypercholesterolemia: Secondary | ICD-10-CM | POA: Insufficient documentation

## 2015-03-10 DIAGNOSIS — Z85038 Personal history of other malignant neoplasm of large intestine: Secondary | ICD-10-CM | POA: Diagnosis not present

## 2015-03-10 DIAGNOSIS — Z1211 Encounter for screening for malignant neoplasm of colon: Secondary | ICD-10-CM | POA: Insufficient documentation

## 2015-03-10 DIAGNOSIS — N182 Chronic kidney disease, stage 2 (mild): Secondary | ICD-10-CM | POA: Insufficient documentation

## 2015-03-10 DIAGNOSIS — Z980 Intestinal bypass and anastomosis status: Secondary | ICD-10-CM | POA: Diagnosis not present

## 2015-03-10 DIAGNOSIS — M1990 Unspecified osteoarthritis, unspecified site: Secondary | ICD-10-CM | POA: Insufficient documentation

## 2015-03-10 HISTORY — PX: COLONOSCOPY WITH PROPOFOL: SHX5780

## 2015-03-10 LAB — GLUCOSE, CAPILLARY
GLUCOSE-CAPILLARY: 102 mg/dL — AB (ref 65–99)
Glucose-Capillary: 68 mg/dL (ref 65–99)

## 2015-03-10 SURGERY — COLONOSCOPY WITH PROPOFOL
Anesthesia: Monitor Anesthesia Care

## 2015-03-10 MED ORDER — PROPOFOL 10 MG/ML IV BOLUS
INTRAVENOUS | Status: AC
  Filled 2015-03-10: qty 60

## 2015-03-10 MED ORDER — FENTANYL CITRATE (PF) 100 MCG/2ML IJ SOLN: 25.0000 ug | INTRAMUSCULAR | Status: DC | PRN

## 2015-03-10 MED ORDER — DEXTROSE 50 % IV SOLN
INTRAVENOUS | Status: AC
  Filled 2015-03-10: qty 50

## 2015-03-10 MED ORDER — DEXTROSE 50 % IV SOLN
25.0000 mL | Freq: Once | INTRAVENOUS | Status: AC
  Administered 2015-03-10: 25 mL via INTRAVENOUS

## 2015-03-10 MED ORDER — PROPOFOL INFUSION 10 MG/ML OPTIME
INTRAVENOUS | Status: DC | PRN
  Administered 2015-03-10: 300 ug/kg/min via INTRAVENOUS

## 2015-03-10 MED ORDER — SODIUM CHLORIDE 0.9 % IV SOLN
INTRAVENOUS | Status: DC
  Administered 2015-03-10: 1000 mL via INTRAVENOUS

## 2015-03-10 MED ORDER — LACTATED RINGERS IV SOLN
INTRAVENOUS | Status: DC | PRN
Start: 2015-03-10 — End: 2015-03-10

## 2015-03-10 MED ORDER — ONDANSETRON HCL 4 MG/2ML IJ SOLN: 4.0000 mg | Freq: Once | INTRAMUSCULAR | Status: DC | PRN

## 2015-03-10 SURGICAL SUPPLY — 21 items

## 2015-03-10 NOTE — Anesthesia Preprocedure Evaluation (Signed)
Anesthesia Evaluation  Patient identified by MRN, date of birth, ID band Patient awake    Reviewed: Allergy & Precautions, NPO status , Patient's Chart, lab work & pertinent test results  History of Anesthesia Complications Negative for: history of anesthetic complications  Airway Mallampati: II  TM Distance: >3 FB Neck ROM: Full    Dental no notable dental hx. (+) Dental Advisory Given   Pulmonary neg pulmonary ROS,  breath sounds clear to auscultation  Pulmonary exam normal       Cardiovascular hypertension, Pt. on medications Normal cardiovascular examRhythm:Regular Rate:Normal     Neuro/Psych negative neurological ROS  negative psych ROS   GI/Hepatic negative GI ROS, Neg liver ROS,   Endo/Other  negative endocrine ROSdiabetes, Oral Hypoglycemic Agents  Renal/GU CRFRenal disease  negative genitourinary   Musculoskeletal  (+) Arthritis -,   Abdominal   Peds negative pediatric ROS (+)  Hematology  (+) anemia ,   Anesthesia Other Findings   Reproductive/Obstetrics negative OB ROS                             Anesthesia Physical Anesthesia Plan  ASA: III  Anesthesia Plan: MAC   Post-op Pain Management:    Induction: Intravenous  Airway Management Planned:   Additional Equipment:   Intra-op Plan:   Post-operative Plan:   Informed Consent: I have reviewed the patients History and Physical, chart, labs and discussed the procedure including the risks, benefits and alternatives for the proposed anesthesia with the patient or authorized representative who has indicated his/her understanding and acceptance.   Dental advisory given  Plan Discussed with: CRNA  Anesthesia Plan Comments:         Anesthesia Quick Evaluation

## 2015-03-10 NOTE — Discharge Instructions (Signed)
Colonoscopy, Care After °These instructions give you information on caring for yourself after your procedure. Your doctor may also give you more specific instructions. Call your doctor if you have any problems or questions after your procedure. °HOME CARE °· Do not drive for 24 hours. °· Do not sign important papers or use machinery for 24 hours. °· You may shower. °· You may go back to your usual activities, but go slower for the first 24 hours. °· Take rest breaks often during the first 24 hours. °· Walk around or use warm packs on your belly (abdomen) if you have belly cramping or gas. °· Drink enough fluids to keep your pee (urine) clear or pale yellow. °· Resume your normal diet. Avoid heavy or fried foods. °· Avoid drinking alcohol for 24 hours or as told by your doctor. °· Only take medicines as told by your doctor. °If a tissue sample (biopsy) was taken during the procedure:  °· Do not take aspirin or blood thinners for 7 days, or as told by your doctor. °· Do not drink alcohol for 7 days, or as told by your doctor. °· Eat soft foods for the first 24 hours. °GET HELP IF: °You still have a small amount of blood in your poop (stool) 2-3 days after the procedure. °GET HELP RIGHT AWAY IF: °· You have more than a small amount of blood in your poop. °· You see clumps of tissue (blood clots) in your poop. °· Your belly is puffy (swollen). °· You feel sick to your stomach (nauseous) or throw up (vomit). °· You have a fever. °· You have belly pain that gets worse and medicine does not help. °MAKE SURE YOU: °· Understand these instructions. °· Will watch your condition. °· Will get help right away if you are not doing well or get worse. °Document Released: 08/20/2010 Document Revised: 07/23/2013 Document Reviewed: 03/25/2013 °ExitCare® Patient Information ©2015 [employer]. This information is not intended to replace advice given to you by your health care provider. Make sure you discuss any questions you have with  your health care provider. ° °

## 2015-03-10 NOTE — H&P (Signed)
_                                                                                                                History of Present Illness:  Mr. Justin [name] is a 74 year old white male with history of colon cancer, rectal villous adenoma, here for follow-up colonoscopy.  Approximate 14 months ago a large villous adenoma was removed.  Follow-up sigmoidoscopy in December, 2015 was negative for recurrence.  He has no GI complaints.   Past Medical History  Diagnosis Date  . Diabetes mellitus without complication   . Hypertension   . Colon cancer     2014  . Hypercholesteremia   . Colon polyps     adenomatous  . Arthritis   . Eczema   . Chronic kidney disease     CKD- Stage  II   Past Surgical History  Procedure Laterality Date  . Colon surgery  2014 & 2015    resection of colon & removal of poylp ; Dr Kathleene Hazel, Dr Sherlene Shams  . External ear surgery  1954  . Colonoscopy  09/29/2013  . Flexible sigmoidoscopy N/A 07/21/2014    Procedure: FLEXIBLE SIGMOIDOSCOPY;  Surgeon: Inda Castle, MD;  Location: [facility];  Service: Endoscopy;  Laterality: N/A;   family history includes Cancer in his brother; Glaucoma in his mother; Heart attack in his father; Hyperlipidemia in his sister; Hypertension in his father; Kidney disease in his mother; Meniere's disease in his sister; Stroke in his mother; Varicose Veins in his mother. Current Facility-Administered Medications  Medication Dose Route Frequency Provider Last Rate Last Dose  . 0.9 %  sodium chloride infusion   Intravenous Continuous Inda Castle, MD 20 mL/hr at 03/10/15 1221 1,000 mL at 03/10/15 1221  . fentaNYL (SUBLIMAZE) injection 25-50 mcg  25-50 mcg Intravenous Q5 min PRN Lauretta Grill, MD      . ondansetron [facility]) injection 4 mg  4 mg Intravenous Once PRN Lauretta Grill, MD       Allergies as of 01/13/2015  . (No Known Allergies)    reports that he has never smoked. He has never used smokeless tobacco. He reports that  he drinks alcohol. He reports that he does not use illicit drugs.   Review of Systems: Pertinent positive and negative review of systems were noted in the above HPI section. All other review of systems were otherwise negative.  Vital signs were reviewed in today's medical record Physical Exam: General: Well developed , well nourished, no acute distress Skin: anicteric Head: Normocephalic and atraumatic Eyes:  sclerae anicteric, EOMI Ears: Normal auditory acuity Mouth: No deformity or lesions Neck: Supple, no masses or thyromegaly Lymph Nodes: no lymphadenopathy Lungs: Clear throughout to auscultation Heart: Regular rate and rhythm; no murmurs, rubs or bruits Gastroinestinal: Soft, non tender and non distended. No masses, hepatosplenomegaly or hernias noted. Normal Bowel sounds Rectal:deferred Musculoskeletal: Symmetrical with no gross deformities  Skin: No lesions on visible extremities Pulses:  Normal pulses noted Extremities: No clubbing, cyanosis, edema or deformities noted Neurological: Alert oriented x 4, grossly nonfocal Cervical Nodes:  No significant cervical adenopathy Inguinal Nodes: No significant inguinal adenopathy Psychological:  Alert and cooperative. Normal mood and affect  Impression-history of colon cancer and colon polyps  Recommendations-colonoscopy

## 2015-03-10 NOTE — Op Note (Signed)
[facility] [place], [address]   COLONOSCOPY PROCEDURE REPORT     EXAM DATE: 03/10/2015  PATIENT NAME:      Justin [name], Justin [name]           MR #:      [id number]  BIRTHDATE:       [birth year]      VISIT #:     [phone]  ATTENDING:     Inda Castle, MD     STATUS:     outpatient ASSISTANT:      Elspeth Cho and Salli Real J  INDICATIONS:  The patient is a 74 yr old male here for a colonoscopy due to [facility] Colon or Rectal Adenocarcinoma. PROCEDURE PERFORMED:     Colonoscopy, screening MEDICATIONS:     Monitored anesthesia care ESTIMATED BLOOD LOSS:     None  CONSENT: The patient understands the risks and benefits of the procedure and understands that these risks include, but are not limited to: sedation, allergic reaction, infection, perforation and/or bleeding. Alternative means of evaluation and treatment include, among others: physical exam, x-rays, and/or surgical intervention. The patient elects to proceed with this endoscopic procedure.  DESCRIPTION OF PROCEDURE: During intra-op preparation period all mechanical & medical equipment was checked for proper function. Hand hygiene and appropriate measures for infection prevention was taken. After the risks, benefits and alternatives of the procedure were thoroughly explained, Informed consent was verified, confirmed and timeout was successfully executed by the treatment team. A digital exam revealed no abnormalities of the rectum. The Pentax Ped Colon [id number] endoscope was introduced through the anus and advanced to the cecum, which was identified by both the appendix and ileocecal valve. (Suprep was used) fair. The instrument was then slowly withdrawn as the colon was fully examined.Estimated blood loss is zero unless otherwise noted in this procedure report.   COLON FINDINGS: A surgical anastomosis was noted at approximately 20 cm from the anus.  There was a blind pouch.  The  remainder of the colon including sigmoid descending transverse and ascending colon and cecum was normal. Retroflexed views revealed no abnormalities. The scope was then completely withdrawn from the patient and the procedure terminated. SCOPE WITHDRAWAL TIME:    ADVERSE EVENTS:      There were no immediate complications.  IMPRESSIONS:     A surgical anastomosis was noted at approximately 20 cm from the anus.  There was a blind pouch.  The remainder of the colon including sigmoid descending transverse and ascending colon and cecum was normal  RECOMMENDATIONS:     Repeat Colonoscopy in 3 years. RECALL:  _____________________________ Inda Castle, MD eSigned:  Inda Castle, MD 03/10/2015 1:07 PM   cc:  the patient   CPT CODES: ICD CODES:  The ICD and CPT codes recommended by this software are interpretations from the data that the clinical staff has captured with the software.  The verification of the translation of this report to the ICD and CPT codes and modifiers is the sole responsibility of the health care institution and practicing physician where this report was generated.  [employer] will not be held responsible for the validity of the ICD and CPT codes included on this report.  AMA assumes no liability for data contained or not contained herein. CPT is a Designer, television/film set of the [employer].   PATIENT NAME:  Justin [name], Justin [name] MR#: [id number]

## 2015-03-10 NOTE — Transfer of Care (Signed)
Immediate Anesthesia Transfer of Care Note  Patient: Justin [name]  Procedure(s) Performed: Procedure(s): COLONOSCOPY WITH PROPOFOL (N/A)  Patient Location: PACU and Endoscopy Unit  Anesthesia Type:MAC  Level of Consciousness: sedated and patient cooperative  Airway & Oxygen Therapy: Patient Spontanous Breathing and Patient connected to face mask oxygen  Post-op Assessment: Report given to RN and Post -op Vital signs reviewed and stable  Post vital signs: Reviewed and stable  Last Vitals:  Filed Vitals:   03/10/15 1205  BP: 141/73  Pulse: 63  Temp: 36.5 C  Resp: 13    Complications: No apparent anesthesia complications

## 2015-03-10 NOTE — Anesthesia Postprocedure Evaluation (Signed)
  Anesthesia Post-op Note  Patient: Justin [name]  Procedure(s) Performed: Procedure(s) (LRB): COLONOSCOPY WITH PROPOFOL (N/A)  Patient Location: PACU  Anesthesia Type: MAC  Level of Consciousness: awake and alert   Airway and Oxygen Therapy: Patient Spontanous Breathing  Post-op Pain: mild  Post-op Assessment: Post-op Vital signs reviewed, Patient's Cardiovascular Status Stable, Respiratory Function Stable, Patent Airway and No signs of Nausea or vomiting  Last Vitals:  Filed Vitals:   03/10/15 1330  BP: 114/47  Pulse: 60  Temp:   Resp: 15    Post-op Vital Signs: stable   Complications: No apparent anesthesia complications

## 2015-03-12 ENCOUNTER — Encounter (HOSPITAL_COMMUNITY): Payer: Self-pay | Admitting: Gastroenterology

## 2015-03-25 DIAGNOSIS — R350 Frequency of micturition: Secondary | ICD-10-CM | POA: Diagnosis not present

## 2015-03-25 DIAGNOSIS — R972 Elevated prostate specific antigen [PSA]: Secondary | ICD-10-CM | POA: Diagnosis not present

## 2015-03-25 DIAGNOSIS — R351 Nocturia: Secondary | ICD-10-CM | POA: Diagnosis not present

## 2015-03-25 DIAGNOSIS — N401 Enlarged prostate with lower urinary tract symptoms: Secondary | ICD-10-CM | POA: Diagnosis not present

## 2015-04-06 ENCOUNTER — Other Ambulatory Visit: Payer: Self-pay | Admitting: Internal Medicine

## 2015-04-09 ENCOUNTER — Other Ambulatory Visit: Payer: Self-pay | Admitting: Internal Medicine

## 2015-05-07 ENCOUNTER — Encounter: Payer: Self-pay | Admitting: *Deleted

## 2015-05-07 DIAGNOSIS — E119 Type 2 diabetes mellitus without complications: Secondary | ICD-10-CM | POA: Diagnosis not present

## 2015-05-07 DIAGNOSIS — H5213 Myopia, bilateral: Secondary | ICD-10-CM | POA: Diagnosis not present

## 2015-05-07 DIAGNOSIS — H52203 Unspecified astigmatism, bilateral: Secondary | ICD-10-CM | POA: Diagnosis not present

## 2015-05-07 DIAGNOSIS — H2513 Age-related nuclear cataract, bilateral: Secondary | ICD-10-CM | POA: Diagnosis not present

## 2015-05-07 DIAGNOSIS — H524 Presbyopia: Secondary | ICD-10-CM | POA: Diagnosis not present

## 2015-05-07 LAB — HM DIABETES EYE EXAM

## 2015-05-14 ENCOUNTER — Ambulatory Visit (INDEPENDENT_AMBULATORY_CARE_PROVIDER_SITE_OTHER): Payer: Medicare Other | Admitting: Internal Medicine

## 2015-05-14 ENCOUNTER — Encounter: Payer: Self-pay | Admitting: Internal Medicine

## 2015-05-14 VITALS — BP 112/66 | HR 81 | Temp 97.4°F | Resp 20 | Ht 67.0 in | Wt 193.8 lb

## 2015-05-14 DIAGNOSIS — E118 Type 2 diabetes mellitus with unspecified complications: Secondary | ICD-10-CM

## 2015-05-14 DIAGNOSIS — I10 Essential (primary) hypertension: Secondary | ICD-10-CM | POA: Diagnosis not present

## 2015-05-14 DIAGNOSIS — E1122 Type 2 diabetes mellitus with diabetic chronic kidney disease: Secondary | ICD-10-CM

## 2015-05-14 DIAGNOSIS — Z85038 Personal history of other malignant neoplasm of large intestine: Secondary | ICD-10-CM | POA: Diagnosis not present

## 2015-05-14 DIAGNOSIS — H6122 Impacted cerumen, left ear: Secondary | ICD-10-CM

## 2015-05-14 DIAGNOSIS — Z23 Encounter for immunization: Secondary | ICD-10-CM

## 2015-05-14 DIAGNOSIS — N403 Nodular prostate with lower urinary tract symptoms: Principal | ICD-10-CM

## 2015-05-14 DIAGNOSIS — N401 Enlarged prostate with lower urinary tract symptoms: Secondary | ICD-10-CM | POA: Diagnosis not present

## 2015-05-14 DIAGNOSIS — N182 Chronic kidney disease, stage 2 (mild): Secondary | ICD-10-CM | POA: Diagnosis not present

## 2015-05-14 DIAGNOSIS — N138 Other obstructive and reflux uropathy: Secondary | ICD-10-CM

## 2015-05-14 MED ORDER — GLIPIZIDE ER 5 MG PO TB24: 5.0000 mg | ORAL_TABLET | Freq: Every day | ORAL | Status: DC

## 2015-05-14 MED ORDER — LISINOPRIL 5 MG PO TABS: 5.0000 mg | ORAL_TABLET | Freq: Every day | ORAL | Status: DC

## 2015-05-14 MED ORDER — METFORMIN HCL 1000 MG PO TABS: ORAL_TABLET | ORAL | Status: DC

## 2015-05-14 MED ORDER — SAXAGLIPTIN HCL 5 MG PO TABS: ORAL_TABLET | ORAL | Status: DC

## 2015-05-14 NOTE — Progress Notes (Signed)
Patient ID: NAS WAFER, male   DOB: 01-05-1941, 74 y.o.   MRN: [id number]   Location: [facility] Provider: Rexene Edison. Mariea Clonts, D.O., C.M.D.  Goals of Care: Advanced Directive information Does patient have an advance directive?: Yes  Chief Complaint  Patient presents with  . Medical Management of Chronic Issues    3 month follow-up for DM, Hypertension  . Immunizations    Will take flu shot today  . OTHER    Would like ears flushed    HPI: Patient is a 74 y.o. male seen in the office today for med mgt of chronic diseases.    Had cscope in August with Dr. Ferman Hamming due in 3 years.  No complications.  PSA elevated at 6.3 in July.  Dr. Gaynelle Arabian has seen him 03/25/15.  Has BPH and for recheck in Feb.    Saw eye doctor last week.    Ears need flushing.  Only left has increased cerumen present.  Flu shot due.    DMII has been well controlled w/o hypoglycemia.  Tolerating meds well.  Needs lipids checked but does not come fasting so will check NONfasting today.  Review of Systems:  Review of Systems  Constitutional: Negative for fever, chills and malaise/fatigue.  HENT: Positive for hearing loss. Negative for ear pain.   Eyes: Negative for blurred vision.  Respiratory: Negative for shortness of breath.   Cardiovascular: Negative for chest pain and leg swelling.  Gastrointestinal: Negative for abdominal pain, constipation, blood in stool and melena.       Hemorrhoids  Genitourinary: Positive for frequency. Negative for dysuria and hematuria.  Musculoskeletal: Negative for falls.       Chronic pain in his bottom  Skin: Negative for rash.  Neurological: Negative for dizziness and weakness.  Endo/Heme/Allergies: Does not bruise/bleed easily.  Psychiatric/Behavioral: Negative for depression and memory loss.    Past Medical History  Diagnosis Date  . Diabetes mellitus without complication ([place])   . Hypertension   . Colon cancer ([place])     2014  .  Hypercholesteremia   . Colon polyps     adenomatous  . Arthritis   . Eczema   . Chronic kidney disease     CKD- Stage  II    Past Surgical History  Procedure Laterality Date  . Colon surgery  2014 & 2015    resection of colon & removal of poylp ; Dr Kathleene Hazel, Dr Sherlene Shams  . External ear surgery  1954  . Colonoscopy  09/29/2013  . Flexible sigmoidoscopy N/A 07/21/2014    Procedure: FLEXIBLE SIGMOIDOSCOPY;  Surgeon: Inda Castle, MD;  Location: [facility];  Service: Endoscopy;  Laterality: N/A;  . Colonoscopy with propofol N/A 03/10/2015    Procedure: COLONOSCOPY WITH PROPOFOL;  Surgeon: Inda Castle, MD;  Location: [facility];  Service: Endoscopy;  Laterality: N/A;    No Known Allergies    Medication List       This list is accurate as of: 05/14/15  9:14 AM.  Always use your most recent med list.               barrier cream Crea  Commonly known as:  non-specified  Apply 1 application topically 2 (two) times daily as needed (for pain in bottom (apply after cleaning up from BM)).     docusate sodium 100 MG capsule  Commonly known as:  COLACE  Take 1 capsule (100 mg total) by mouth daily.     ferrous sulfate 325 (  65 FE) MG EC tablet  Take 325 mg by mouth daily.     GLIPIZIDE XL 5 MG 24 hr tablet  Generic drug:  glipiZIDE  TAKE 1 TABLET BY MOUTH DAILY WITH BREAKFAST     glucose blood test strip  One Touch Ultra Mini Test Strips Sig:Use as instructed: once daily for Diabetes Dx 250.00     lisinopril 5 MG tablet  Commonly known as:  PRINIVIL,ZESTRIL  TAKE 1 TABLET BY MOUTH DAILY     metFORMIN 1000 MG tablet  Commonly known as:  GLUCOPHAGE  TAKE 1 TABLET BY MOUTH 2 TIMES DAILY WITH A MEAL.     Na Sulfate-K Sulfate-Mg Sulf Soln  Use 1 kit as directed for colonoscopy.     ONGLYZA 5 MG Tabs tablet  Generic drug:  saxagliptin HCl  TAKE ONE TABLET BY MOUTH ONCE DAILY WITH LARGEST MEAL TO CONTROL BLOOD SUGAR.     simvastatin 80 MG tablet  Commonly known as:   ZOCOR  TAKE 1 TABLET BY MOUTH DAILY IN THE EVENING FOR HYPERLIPEDEMIA        Health Maintenance  Topic Date Due  . URINE MICROALBUMIN  06/24/1950  . ZOSTAVAX  06/24/2000  . PNA vac Low Risk Adult (2 of 2 - PCV13) 03/12/2010  . INFLUENZA VACCINE  03/02/2015  . FOOT EXAM  05/31/2015  . HEMOGLOBIN A1C  08/12/2015  . OPHTHALMOLOGY EXAM  05/06/2016  . TETANUS/TDAP  02/08/2023  . COLONOSCOPY  03/09/2025    Physical Exam: Filed Vitals:   05/14/15 0842  BP: 112/66  Pulse: 81  Temp: 97.4 F (36.3 C)  TempSrc: Oral  Resp: 20  Height: '5\' 7"'  (1.702 m)  Weight: 193 lb 12.8 oz (87.907 kg)  SpO2: 97%   Body mass index is 30.35 kg/(m^2). Physical Exam  Constitutional: He appears well-developed and well-nourished. No distress.  HENT:  Left cerumen impaction; right with small amt of wax in the canal  Cardiovascular: Normal rate, regular rhythm, normal heart sounds and intact distal pulses.   Pulmonary/Chest: Effort normal and breath sounds normal.  Abdominal: Soft. Bowel sounds are normal. He exhibits no distension. There is no tenderness.  Musculoskeletal: Normal range of motion.  Neurological: He is alert.  Skin: Skin is warm and dry.  Midline raised papule (large pink nevus)  Psychiatric: He has a normal mood and affect.    Labs reviewed: Basic Metabolic Panel:  Recent Labs  09/29/14 1159 02/09/15 1154  NA 141 140  K 4.8 5.4*  CL 99 99  CO2 21 23  GLUCOSE 132* 71  BUN 27 34*  CREATININE 1.11 1.35*  CALCIUM 9.3 9.7   Liver Function Tests:  Recent Labs  09/29/14 1159  AST 18  ALT 24  ALKPHOS 102  BILITOT 0.8  PROT 7.2  ALBUMIN 4.4   No results for input(s): LIPASE, AMYLASE in the last 8760 hours. No results for input(s): AMMONIA in the last 8760 hours. CBC:  Recent Labs  09/29/14 1159  WBC 10.8  NEUTROABS 7.2*  HGB 14.0  HCT 42.8  MCV 89   Lipid Panel: No results for input(s): CHOL, HDL, LDLCALC, TRIG, CHOLHDL, LDLDIRECT in the last 8760  hours. Lab Results  Component Value Date   HGBA1C 6.0* 02/09/2015    Procedures since last visit:REviewed urology note and colonoscopy report  Assessment/Plan 1. Prostate nodule with urinary obstruction -has BPH and elevated PSA -keep f/u with [facility] in Feb as planned with PSA before  2. Personal history of colon  cancer, stage II -just s/p cscope--needs repeat in 3 years with Dr. Deatra Ina  3. Type 2 diabetes mellitus with complication, without long-term current use of insulin (HCC) -has been well controlled on current regimen with onglyza, metformin, lisinopril, glipizide; no known lows - Basic Metabolic Panel; Future - CBC with Differential; Future - Lipid Panel; Future - Hemoglobin A1c; Future  4. Essential hypertension, benign - bp at goal with current therapy, no changes needed - Basic Metabolic Panel; Future - CBC with Differential; Future  5. Cerumen impaction, left -flushed by CMA with warm water and peroxide  6. CKD stage 2 due to type 2 diabetes mellitus ([place]) - renal function has been stable - f/u labs - Basic Metabolic Panel; Future  7. Need for prophylactic vaccination and inoculation against influenza - Flu Vaccine QUAD 36+ mos PF IM (Fluarix & Fluzone Quad PF)  Was given  Labs/tests ordered:  Labs were supposed to be done today, but patient was sent home without doing them so they were scheduled for future Orders Placed This Encounter  Procedures  . Flu Vaccine QUAD 36+ mos PF IM (Fluarix & Fluzone Quad PF)  . Basic Metabolic Panel    Standing Status: Future     Number of Occurrences:      Standing Expiration Date: 05/13/2016  . CBC with Differential    Standing Status: Future     Number of Occurrences:      Standing Expiration Date: 05/13/2016  . Lipid Panel    Standing Status: Future     Number of Occurrences:      Standing Expiration Date: 05/13/2016  . Hemoglobin A1c    Standing Status: Future     Number of Occurrences:      Standing  Expiration Date: 05/13/2016    Next appt:  08/11/2015   Tamecka Milham L. Francenia Chimenti, D.O. [facility] [address], [place] [address] Cell Phone (Mon-Fri 8am-5pm):  [phone] On Call:  [phone] & follow prompts after 5pm & weekends Office Phone:  [phone] Office Fax:  [phone]

## 2015-06-07 ENCOUNTER — Other Ambulatory Visit: Payer: Self-pay | Admitting: Internal Medicine

## 2015-06-18 ENCOUNTER — Encounter: Payer: Self-pay | Admitting: Nurse Practitioner

## 2015-06-18 ENCOUNTER — Ambulatory Visit (INDEPENDENT_AMBULATORY_CARE_PROVIDER_SITE_OTHER): Payer: Medicare Other | Admitting: Nurse Practitioner

## 2015-06-18 VITALS — BP 100/68 | HR 76 | Temp 97.4°F | Resp 12 | Ht 67.0 in | Wt 195.0 lb

## 2015-06-18 DIAGNOSIS — F329 Major depressive disorder, single episode, unspecified: Secondary | ICD-10-CM

## 2015-06-18 DIAGNOSIS — K5900 Constipation, unspecified: Secondary | ICD-10-CM | POA: Diagnosis not present

## 2015-06-18 DIAGNOSIS — F32A Depression, unspecified: Secondary | ICD-10-CM

## 2015-06-18 NOTE — Patient Instructions (Signed)
To take Senna-S by mouth daily Or colace with miralax 17 gm daily  Increase water intake- 8- 8 oz glass of water is recommended   Constipation, Adult Constipation is when a person has fewer than three bowel movements a week, has difficulty having a bowel movement, or has stools that are dry, hard, or larger than normal. As people grow older, constipation is more common. A low-fiber diet, not taking in enough fluids, and taking certain medicines may make constipation worse.  CAUSES   Certain medicines, such as antidepressants, pain medicine, iron supplements, antacids, and water pills.   Certain diseases, such as diabetes, irritable bowel syndrome (IBS), thyroid disease, or depression.   Not drinking enough water.   Not eating enough fiber-rich foods.   Stress or travel.   Lack of physical activity or exercise.   Ignoring the urge to have a bowel movement.   Using laxatives too much.  SIGNS AND SYMPTOMS   Having fewer than three bowel movements a week.   Straining to have a bowel movement.   Having stools that are hard, dry, or larger than normal.   Feeling full or bloated.   Pain in the lower abdomen.   Not feeling relief after having a bowel movement.  DIAGNOSIS  Your health care provider will take a medical history and perform a physical exam. Further testing may be done for severe constipation. Some tests may include:  A barium enema X-ray to examine your rectum, colon, and, sometimes, your small intestine.   A sigmoidoscopy to examine your lower colon.   A colonoscopy to examine your entire colon. TREATMENT  Treatment will depend on the severity of your constipation and what is causing it. Some dietary treatments include drinking more fluids and eating more fiber-rich foods. Lifestyle treatments may include regular exercise. If these diet and lifestyle recommendations do not help, your health care provider may recommend taking over-the-counter laxative  medicines to help you have bowel movements. Prescription medicines may be prescribed if over-the-counter medicines do not work.  HOME CARE INSTRUCTIONS   Eat foods that have a lot of fiber, such as fruits, vegetables, whole grains, and beans.  Limit foods high in fat and processed sugars, such as french fries, hamburgers, cookies, candies, and soda.   A fiber supplement may be added to your diet if you cannot get enough fiber from foods.   Drink enough fluids to keep your urine clear or pale yellow.   Exercise regularly or as directed by your health care provider.   Go to the restroom when you have the urge to go. Do not hold it.   Only take over-the-counter or prescription medicines as directed by your health care provider. Do not take other medicines for constipation without talking to your health care provider first.  [place] IF:   You have bright red blood in your stool.   Your constipation lasts for more than 4 days or gets worse.   You have abdominal or rectal pain.   You have thin, pencil-like stools.   You have unexplained weight loss. MAKE SURE YOU:   Understand these instructions.  Will watch your condition.  Will get help right away if you are not doing well or get worse.   This information is not intended to replace advice given to you by your health care provider. Make sure you discuss any questions you have with your health care provider.   Document Released: 04/15/2004 Document Revised: 08/08/2014 Document Reviewed: 04/29/2013 Elsevier  Interactive Patient Education 2016 [employer]vier Inc.  

## 2015-06-18 NOTE — Progress Notes (Signed)
Patient ID: Justin [name], male   DOB: June 10, 1941, 74 y.o.   MRN: [id number]    PCP: Hollace Kinnier, DO  Advanced Directive information    No Known Allergies  Chief Complaint  Patient presents with  . Acute Visit    Constipation x 3 weeks and jock itch. Here with sister Justin [name]  . Depression    Patient's seems sad or fustrated but denies depression.   . Sleeping Problem    Trouble staying asleep      HPI: Patient is a 74 y.o. male seen in the office today due to problems with "rear end".  Having trouble with BMs. Constipated and having to strain to get stool out.  Seeing Urology for elevated, following in February to follow up PSA.  Sister worried pt seemed depressed yesterday, seems like he is worried about his overall health-- questioning if prostate cancer is causing constipation.  Sister reports his diet is poor, does not eat fiber, vegetables, fruits or whole grain.  Drink a little of water. (1 glass a day) Drinks 12 oz soda and a beer.  Pt does not wish to talk about depression or anxiety, denies both.   Review of Systems:  Review of Systems  Respiratory: Negative for shortness of breath.   Cardiovascular: Negative for chest pain and palpitations.  Gastrointestinal: Positive for constipation and rectal pain. Negative for nausea, vomiting, abdominal pain, diarrhea, blood in stool, abdominal distention and anal bleeding.  Genitourinary: Negative for difficulty urinating.  Psychiatric/Behavioral: Positive for agitation (when talking about depression or anxiety).    Past Medical History  Diagnosis Date  . Diabetes mellitus without complication ([place])   . Hypertension   . Colon cancer ([place])     2014  . Hypercholesteremia   . Colon polyps     adenomatous  . Arthritis   . Eczema   . Chronic kidney disease     CKD- Stage  II   Past Surgical History  Procedure Laterality Date  . Colon surgery  2014 & 2015    resection of colon & removal of poylp ; Dr Kathleene Hazel, Dr  Sherlene Shams  . External ear surgery  1954  . Colonoscopy  09/29/2013  . Flexible sigmoidoscopy N/A 07/21/2014    Procedure: FLEXIBLE SIGMOIDOSCOPY;  Surgeon: Inda Castle, MD;  [facility];  Service: Endoscopy;  Laterality: N/A;  . Colonoscopy with propofol N/A 03/10/2015    Procedure: COLONOSCOPY WITH PROPOFOL;  Surgeon: Inda Castle, MD;  Location: [facility];  Service: Endoscopy;  Laterality: N/A;   Social History:   reports that he has never smoked. He has never used smokeless tobacco. He reports that he drinks alcohol. He reports that he does not use illicit drugs.  Family History  Problem Relation Age of Onset  . Varicose Veins Mother   . Stroke Mother   . Glaucoma Mother   . Kidney disease Mother   . Hypertension Father   . Heart attack Father   . Cancer Brother     testicular cancer  . Hyperlipidemia Sister   . Meniere's disease Sister     Medications: Patient's Medications  New Prescriptions   No medications on file  Previous Medications   BARRIER CREAM (NON-SPECIFIED) CREA    Apply 1 application topically 2 (two) times daily as needed (for pain in bottom (apply after cleaning up from BM)).   DOCUSATE SODIUM (COLACE) 100 MG CAPSULE    Take 1 capsule (100 mg total) by mouth daily.   GLIPIZIDE (  GLIPIZIDE XL) 5 MG 24 HR TABLET    Take 1 tablet (5 mg total) by mouth daily with breakfast.   GLUCOSE BLOOD TEST STRIP    One Touch Ultra Mini Test Strips Sig:Use as instructed: once daily for Diabetes Dx 250.00   LISINOPRIL (PRINIVIL,ZESTRIL) 5 MG TABLET    Take 1 tablet (5 mg total) by mouth daily.   METFORMIN (GLUCOPHAGE) 1000 MG TABLET    TAKE 1 TABLET BY MOUTH 2 TIMES DAILY WITH A MEAL.   NA SULFATE-K SULFATE-MG SULF SOLN    Use 1 kit as directed for colonoscopy.   SAXAGLIPTIN HCL (ONGLYZA) 5 MG TABS TABLET    TAKE ONE TABLET BY MOUTH ONCE DAILY WITH LARGEST MEAL TO CONTROL BLOOD SUGAR.   SIMVASTATIN (ZOCOR) 80 MG TABLET    TAKE 1 TABLET BY MOUTH DAILY IN THE  EVENING FOR HYPERLIPEDEMIA  Modified Medications   No medications on file  Discontinued Medications   FERROUS SULFATE 325 (65 FE) MG EC TABLET    Take 325 mg by mouth daily.      Physical Exam:  Filed Vitals:   06/18/15 1552  BP: 100/68  Pulse: 76  Temp: 97.4 F (36.3 C)  TempSrc: Oral  Resp: 12  Height: _0  (1.702 m)  Weight: 195 lb (88.451 kg)  SpO2: 96%   Body mass index is 30.53 kg/(m^2).  Physical Exam  Constitutional: He is oriented to person, place, and time. He appears well-developed and well-nourished. No distress.  HENT:  Head: Normocephalic and atraumatic.  Mouth/Throat: Oropharynx is clear and moist. No oropharyngeal exudate.  Eyes: Conjunctivae and EOM are normal. Pupils are equal, round, and reactive to light.  Neck: Normal range of motion. Neck supple.  Cardiovascular: Normal rate, regular rhythm and normal heart sounds.   Pulmonary/Chest: Effort normal and breath sounds normal.  Abdominal: Soft. Bowel sounds are normal.  Genitourinary: Rectum normal, testes normal and penis normal. Rectal exam shows no external hemorrhoid and no tenderness.  Rectum and surrounding area normal  Neurological: He is alert and oriented to person, place, and time.  Skin: Skin is warm and dry. He is not diaphoretic.  Psychiatric: He has a normal mood and affect.    Labs reviewed: Basic Metabolic Panel:  Recent Labs  09/29/14 1159 02/09/15 1154  NA 141 140  K 4.8 5.4*  CL 99 99  CO2 21 23  GLUCOSE 132* 71  BUN 27 34*  CREATININE 1.11 1.35*  CALCIUM 9.3 9.7   Liver Function Tests:  Recent Labs  09/29/14 1159  AST 18  ALT 24  ALKPHOS 102  BILITOT 0.8  PROT 7.2  ALBUMIN 4.4   No results for input(s): LIPASE, AMYLASE in the last 8760 hours. No results for input(s): AMMONIA in the last 8760 hours. CBC:  Recent Labs  09/29/14 1159  WBC 10.8  NEUTROABS 7.2*  HGB 14.0  HCT 42.8  MCV 89   Lipid Panel: No results for input(s): CHOL, HDL, LDLCALC,  TRIG, CHOLHDL, LDLDIRECT in the last 8760 hours. TSH: No results for input(s): TSH in the last 8760 hours. A1C: Lab Results  Component Value Date   HGBA1C 6.0* 02/09/2015     Assessment/Plan 1. Constipation, unspecified constipation type -to increase water intake, 8 oz glass of water is recommended - may use  Senna-S by mouth daily or colace with miralax 17 gm daily  -information provided  2. Depression -sister said pt has seemed down this week and thing this is due to overall  health however pt does not think this is an issue and gets very agitated when it is brought up. Will monitor for now.    Carlos American. Harle Battiest  [facility] [phone] 8 am - 5 pm) [phone] (after hours)

## 2015-06-19 ENCOUNTER — Telehealth: Payer: Self-pay

## 2015-06-19 NOTE — Telephone Encounter (Signed)
[facility] called indicating Crea Cream is no longer available. Please provide an alternative

## 2015-06-19 NOTE — Telephone Encounter (Signed)
Can use an over the counter barrier cream

## 2015-06-19 NOTE — Telephone Encounter (Signed)
Left message on voicemail for patient's sister informing her of recommendation. I called [facility] as well and left message on voicemail informing them of recommendation as well

## 2015-06-27 ENCOUNTER — Other Ambulatory Visit: Payer: Self-pay | Admitting: Internal Medicine

## 2015-07-06 ENCOUNTER — Other Ambulatory Visit: Payer: Self-pay | Admitting: Internal Medicine

## 2015-08-11 ENCOUNTER — Other Ambulatory Visit: Payer: Medicare Other

## 2015-08-12 ENCOUNTER — Other Ambulatory Visit: Payer: Medicare Other

## 2015-08-12 DIAGNOSIS — N182 Chronic kidney disease, stage 2 (mild): Secondary | ICD-10-CM

## 2015-08-12 DIAGNOSIS — E118 Type 2 diabetes mellitus with unspecified complications: Secondary | ICD-10-CM | POA: Diagnosis not present

## 2015-08-12 DIAGNOSIS — I10 Essential (primary) hypertension: Secondary | ICD-10-CM

## 2015-08-12 DIAGNOSIS — E1122 Type 2 diabetes mellitus with diabetic chronic kidney disease: Secondary | ICD-10-CM

## 2015-08-13 ENCOUNTER — Encounter: Payer: Self-pay | Admitting: Internal Medicine

## 2015-08-13 ENCOUNTER — Ambulatory Visit (INDEPENDENT_AMBULATORY_CARE_PROVIDER_SITE_OTHER): Payer: Medicare Other | Admitting: Internal Medicine

## 2015-08-13 VITALS — BP 120/78 | HR 76 | Temp 97.7°F | Ht 65.0 in | Wt 194.0 lb

## 2015-08-13 DIAGNOSIS — I10 Essential (primary) hypertension: Secondary | ICD-10-CM | POA: Diagnosis not present

## 2015-08-13 DIAGNOSIS — R0781 Pleurodynia: Secondary | ICD-10-CM | POA: Diagnosis not present

## 2015-08-13 DIAGNOSIS — Z0000 Encounter for general adult medical examination without abnormal findings: Secondary | ICD-10-CM

## 2015-08-13 DIAGNOSIS — H6123 Impacted cerumen, bilateral: Secondary | ICD-10-CM

## 2015-08-13 DIAGNOSIS — Z23 Encounter for immunization: Secondary | ICD-10-CM

## 2015-08-13 LAB — CBC WITH DIFFERENTIAL/PLATELET
Basophils Absolute: 0 10*3/uL (ref 0.0–0.2)
Basos: 0 %
EOS (ABSOLUTE): 0.2 10*3/uL (ref 0.0–0.4)
Eos: 2 %
Hematocrit: 42.1 % (ref 37.5–51.0)
Hemoglobin: 14.2 g/dL (ref 12.6–17.7)
Immature Grans (Abs): 0 10*3/uL (ref 0.0–0.1)
Immature Granulocytes: 1 %
Lymphocytes Absolute: 1.2 10*3/uL (ref 0.7–3.1)
Lymphs: 15 %
MCH: 29.6 pg (ref 26.6–33.0)
MCHC: 33.7 g/dL (ref 31.5–35.7)
MCV: 88 fL (ref 79–97)
Monocytes Absolute: 1.1 10*3/uL — ABNORMAL HIGH (ref 0.1–0.9)
Monocytes: 13 %
Neutrophils Absolute: 5.8 10*3/uL (ref 1.4–7.0)
Neutrophils: 69 %
Platelets: 191 10*3/uL (ref 150–379)
RBC: 4.79 x10E6/uL (ref 4.14–5.80)
RDW: 13.3 % (ref 12.3–15.4)
WBC: 8.3 10*3/uL (ref 3.4–10.8)

## 2015-08-13 LAB — HEMOGLOBIN A1C
Est. average glucose Bld gHb Est-mCnc: 131 mg/dL
Hgb A1c MFr Bld: 6.2 % — ABNORMAL HIGH (ref 4.8–5.6)

## 2015-08-13 LAB — LIPID PANEL
Chol/HDL Ratio: 3.6 ratio units (ref 0.0–5.0)
Cholesterol, Total: 151 mg/dL (ref 100–199)
HDL: 42 mg/dL (ref >39)
LDL Calculated: 79 mg/dL (ref 0–99)
Triglycerides: 151 mg/dL — ABNORMAL HIGH (ref 0–149)
VLDL Cholesterol Cal: 30 mg/dL (ref 5–40)

## 2015-08-13 LAB — BASIC METABOLIC PANEL
BUN/Creatinine Ratio: 20 (ref 10–22)
BUN: 25 mg/dL (ref 8–27)
CO2: 22 mmol/L (ref 18–29)
Calcium: 9 mg/dL (ref 8.6–10.2)
Chloride: 101 mmol/L (ref 96–106)
Creatinine, Ser: 1.22 mg/dL (ref 0.76–1.27)
GFR calc Af Amer: 67 mL/min/{1.73_m2} (ref >59)
GFR calc non Af Amer: 58 mL/min/{1.73_m2} — ABNORMAL LOW (ref >59)
Glucose: 119 mg/dL — ABNORMAL HIGH (ref 65–99)
Potassium: 4.9 mmol/L (ref 3.5–5.2)
Sodium: 137 mmol/L (ref 134–144)

## 2015-08-13 MED ORDER — METFORMIN HCL 1000 MG PO TABS: ORAL_TABLET | ORAL | Status: DC

## 2015-08-13 NOTE — Progress Notes (Signed)
Patient ID: Justin [name], male   DOB: 1940/10/03, 75 y.o.   MRN: [id number]   Location: [place]  Provider: Rexene Edison. Mariea Clonts, D.O., C.M.D.  Code Status: DNR Goals of Care: Advanced Directive information Does patient have an advance directive?: Yes, Type of Advance Directive: Healthcare Power of Attorney  Chief Complaint  Patient presents with  . Annual Exam    Wellness exam  . MMSE    HPI: Patient is a 75 y.o. male seen in the office today for an annual wellness exam.    Depression screen [facility] 2/9 08/13/2015 06/18/2015 09/29/2014 05/30/2014 03/25/2014  Decreased Interest 0 0 0 0 0  Down, Depressed, Hopeless 0 0 0 0 0  PHQ - 2 Score 0 0 0 0 0    Fall Risk  08/13/2015 06/18/2015 05/14/2015 02/09/2015 10/13/2014  Falls in the past year? No No No No No   MMSE - Mini Mental State Exam 05/30/2014  Orientation to time 5  Orientation to Place 4  Registration 3  Attention/ Calculation 3  Recall 2  Language- name 2 objects 2  Language- repeat 1  Language- follow 3 step command 3  Language- read & follow direction 1  Write a sentence 1  Copy design 0  Total score 25  passed clock today  Health Maintenance  Topic Date Due  . ZOSTAVAX  06/24/2000  . PNA vac Low Risk Adult (2 of 2 - PCV13) 03/12/2010  . FOOT EXAM  05/31/2015  . HEMOGLOBIN A1C  08/12/2015  . INFLUENZA VACCINE  03/01/2016  . OPHTHALMOLOGY EXAM  05/06/2016  . TETANUS/TDAP  02/08/2023  . COLONOSCOPY  03/09/2025   Urinary incontinence:  Has to get up three times at night.  12, 3:30 and then early am.  No incontinence of bladder.  Reports he has a bm with each time he urinates so apparently his constipation is controlled.  Functional status:  Takes pills on his own, sister helps keep track of appts, does own bathing, dressing, grooming, does not drive--take scat or other transportation.  Mostly manages his own finances, but I believe his sister is also helping to keep an eye on this.  Exercise:  Does not  exercise. Diet:  Eats regular diet.   Vision:  20/70 both eyes individually and together Hearing:  HOH and has cerumen impaction recurrently. Dentition:  No tooth problem.  Has dental coverage now.  Went about 2 mos ago.   Pain:  Left rib cage is sore and needs to see chiropractor.  Requires referral he says.    Also needs his ears cleaned out, as usual.   Foot exam done.  Has upcoming appt with Dr.Tannenbaum re: prostate cancer.  Review of Systems:  Review of Systems  Constitutional: Negative for fever and chills.  HENT: Positive for hearing loss.        Increased cerumen  Eyes: Positive for blurred vision.  Respiratory: Negative for shortness of breath.   Cardiovascular: Negative for chest pain.  Gastrointestinal: Positive for constipation. Negative for abdominal pain, diarrhea, blood in stool and melena.  Genitourinary: Positive for urgency and frequency. Negative for dysuria.  Musculoskeletal: Positive for myalgias and back pain. Negative for falls.  Neurological: Negative for dizziness.  Psychiatric/Behavioral: Positive for memory loss.    Past Medical History  Diagnosis Date  . Diabetes mellitus without complication ([place])   . Hypertension   . Colon cancer ([place])     2014  . Hypercholesteremia   . Colon polyps  adenomatous  . Arthritis   . Eczema   . Chronic kidney disease     CKD- Stage  II    Past Surgical History  Procedure Laterality Date  . Colon surgery  2014 & 2015    resection of colon & removal of poylp ; Dr Kathleene Hazel, Dr Sherlene Shams  . External ear surgery  1954  . Colonoscopy  09/29/2013  . Flexible sigmoidoscopy N/A 07/21/2014    Procedure: FLEXIBLE SIGMOIDOSCOPY;  Surgeon: Inda Castle, MD;  Location: [facility];  Service: Endoscopy;  Laterality: N/A;  . Colonoscopy with propofol N/A 03/10/2015    Procedure: COLONOSCOPY WITH PROPOFOL;  Surgeon: Inda Castle, MD;  Location: [facility];  Service: Endoscopy;  Laterality: N/A;    No Known  Allergies    Medication List       This list is accurate as of: 08/13/15  9:48 AM.  Always use your most recent med list.               docusate sodium 100 MG capsule  Commonly known as:  COLACE  Take 1 capsule (100 mg total) by mouth daily.     glipiZIDE 5 MG 24 hr tablet  Commonly known as:  GLIPIZIDE XL  Take 1 tablet (5 mg total) by mouth daily with breakfast.     glucose blood test strip  Commonly known as:  ONE TOUCH ULTRA TEST  Check blood sugar once daily as directed for diabetes DX E11.8     lisinopril 5 MG tablet  Commonly known as:  PRINIVIL,ZESTRIL  Take 1 tablet (5 mg total) by mouth daily.     metFORMIN 1000 MG tablet  Commonly known as:  GLUCOPHAGE  TAKE 1 TABLET BY MOUTH 2 TIMES DAILY WITH A MEAL.     saxagliptin HCl 5 MG Tabs tablet  Commonly known as:  ONGLYZA  TAKE ONE TABLET BY MOUTH ONCE DAILY WITH LARGEST MEAL TO CONTROL BLOOD SUGAR.     simvastatin 80 MG tablet  Commonly known as:  ZOCOR  TAKE 1 TABLET BY MOUTH DAILY IN THE EVENING FOR HYPERLIPEDEMIA        Physical Exam: Filed Vitals:   08/13/15 0923  BP: 120/78  Pulse: 76  Temp: 97.7 F (36.5 C)  TempSrc: Oral  Height: 5\' 5"  (1.651 m)  Weight: 194 lb (87.998 kg)  SpO2: 94%   Body mass index is 32.28 kg/(m^2). Physical Exam  Constitutional: He is oriented to person, place, and time. He appears well-developed and well-nourished. No distress.  HENT:  Head: Normocephalic and atraumatic.  Right Ear: External ear normal.  Left Ear: External ear normal.  Mouth/Throat: Oropharynx is clear and moist.  Large amts of yellow mucus in his nose, moist cough  Eyes: Conjunctivae and EOM are normal. Pupils are equal, round, and reactive to light.  Neck: Normal range of motion. Neck supple. No JVD present.  Cardiovascular: Normal rate, regular rhythm, normal heart sounds and intact distal pulses.   Pulmonary/Chest: Effort normal and breath sounds normal.  CTA  Abdominal: Soft. Bowel sounds  are normal. He exhibits no distension and no mass. There is no tenderness. There is no rebound and no guarding.  Musculoskeletal: He exhibits tenderness.  Over left posterior ribs, increased with coughing  Lymphadenopathy:    He has no cervical adenopathy.  Neurological: He is alert and oriented to person, place, and time. He has normal reflexes. No cranial nerve deficit.  Skin: Skin is warm and dry.  A few dry  scaly patches of skin--left posterior arm, left upper back  Psychiatric: He has a normal mood and affect.    Labs reviewed: Basic Metabolic Panel:  Recent Labs  09/29/14 1159 02/09/15 1154 08/12/15 0810  NA 141 140 137  K 4.8 5.4* 4.9  CL 99 99 101  CO2 21 23 22   GLUCOSE 132* 71 119*  BUN 27 34* 25  CREATININE 1.11 1.35* 1.22  CALCIUM 9.3 9.7 9.0   Liver Function Tests:  Recent Labs  09/29/14 1159  AST 18  ALT 24  ALKPHOS 102  BILITOT 0.8  PROT 7.2  ALBUMIN 4.4   No results for input(s): LIPASE, AMYLASE in the last 8760 hours. No results for input(s): AMMONIA in the last 8760 hours. CBC:  Recent Labs  09/29/14 1159 08/12/15 0810  WBC 10.8 8.3  NEUTROABS 7.2* 5.8  HGB 14.0  --   HCT 42.8 42.1  MCV 89 88  PLT  --  191   Lipid Panel:  Recent Labs  08/12/15 0810  CHOL 151  HDL 42  LDLCALC 79  TRIG 151*  CHOLHDL 3.6   Lab Results  Component Value Date   HGBA1C 6.2* 08/12/2015    Assessment/Plan 1. Medicare annual wellness visit, subsequent - prevnar given, EKG done, ear lavage, diabetic foot exam performed -see hpi for details - CBC with Differential/Platelet; Future - Comprehensive metabolic panel; Future - Hemoglobin A1c; Future - Lipid panel; Future  2. Rib pain on left side - he requests a chiropractic referral for this so order placed--he was going there from here--unclear if he has injured himself or it it is more postural (suspect postural) - Ambulatory referral to Chiropractic  3. Need for vaccination with 13-polyvalent  pneumococcal conjugate vaccine - prevnar given  4. Essential hypertension, benign - bp well controlled with ace only - EKG 12-Lead showed NSR with rate 70bpm, no acute ischemia or infarct - Comprehensive metabolic panel; Future  5.  Bilateral cerumen impaction -ear lavage bilaterally was performed by CMA, no instrumentation by me  Labs/tests ordered:   Orders Placed This Encounter  Procedures  . CBC with Differential/Platelet    Standing Status: Future     Number of Occurrences:      Standing Expiration Date: 02/10/2016  . Comprehensive metabolic panel    Standing Status: Future     Number of Occurrences:      Standing Expiration Date: 02/10/2016    Order Specific Question:  Has the patient fasted?    Answer:  Yes  . Hemoglobin A1c    Standing Status: Future     Number of Occurrences:      Standing Expiration Date: 02/10/2016  . Lipid panel    Standing Status: Future     Number of Occurrences:      Standing Expiration Date: 02/10/2016    Order Specific Question:  Has the patient fasted?    Answer:  Yes  . Ambulatory referral to Chiropractic    Referral Priority:  Routine    Referral Type:  Chiropractic    Referral Reason:  Specialty Services Required    Requested Specialty:  Chiropractic Medicine    Number of Visits Requested:  1  . EKG 12-Lead    Order Specific Question:  Where should this test be performed    Answer:  OTHER    Next appt:  3 mos for med mgt   Jomarie Gellis L. Val Farnam, D.O. [facility] [address], [place] [address]  Cell Phone (Mon-Fri 8am-5pm):  [phone] On Call:  [phone] & follow prompts after 5pm & weekends Office Phone:  [phone] Office Fax:  [phone]

## 2015-08-13 NOTE — Patient Instructions (Addendum)
hba1c has gone up slightly (sugar average)--need to take metformin twice a day as ordered not just once Need to drink enough water (6 -8 8oz glasses per day) and massage finger during finger sticks May use diabetic cough syrup for congestion Avoid things with D or DM that will increase blood pressure Also may apply warm compresses to left ribs (sore from coughing) and chiropractor referral entered

## 2015-08-19 ENCOUNTER — Other Ambulatory Visit: Payer: Self-pay | Admitting: Internal Medicine

## 2015-08-20 ENCOUNTER — Other Ambulatory Visit: Payer: Self-pay | Admitting: Internal Medicine

## 2015-08-20 ENCOUNTER — Telehealth: Payer: Self-pay | Admitting: *Deleted

## 2015-08-20 DIAGNOSIS — M546 Pain in thoracic spine: Secondary | ICD-10-CM

## 2015-08-20 NOTE — Telephone Encounter (Signed)
Patient dropped off Rx/Order recommending AP/Lat thoracic Radiographs from the [facility] [phone] Fax: [phone], [place] [address]) The referral has to come from PCP due to non covered chiropractic service. Please Advise.

## 2015-08-20 NOTE — Telephone Encounter (Signed)
X rays were ordered.

## 2015-08-21 ENCOUNTER — Telehealth: Payer: Self-pay | Admitting: Internal Medicine

## 2015-08-21 ENCOUNTER — Ambulatory Visit
Admission: RE | Admit: 2015-08-21 | Discharge: 2015-08-21 | Disposition: A | Discharge status: Home / Self Care | Payer: Medicare Other | Source: Ambulatory Visit | Attending: Internal Medicine | Admitting: Internal Medicine

## 2015-08-21 DIAGNOSIS — M546 Pain in thoracic spine: Secondary | ICD-10-CM | POA: Diagnosis not present

## 2015-08-21 NOTE — Telephone Encounter (Signed)
Patient walked in to the office to ask about the X-Ray that the Chiropractor requested. Dr. Mariea Clonts ordered the X-Ray yesterday evening, gave patient the information and locations for [facility] (also gave him a map) instructed patient he could just walk in for the X-ray and give them his name for this order.

## 2015-08-31 NOTE — Addendum Note (Signed)
Addended by: Ripley Fraise on: 08/31/2015 12:07 PM   Modules accepted: Orders

## 2015-09-10 ENCOUNTER — Other Ambulatory Visit: Payer: Self-pay | Admitting: Internal Medicine

## 2015-09-15 DIAGNOSIS — R972 Elevated prostate specific antigen [PSA]: Secondary | ICD-10-CM | POA: Diagnosis not present

## 2015-09-17 DIAGNOSIS — M9902 Segmental and somatic dysfunction of thoracic region: Secondary | ICD-10-CM | POA: Diagnosis not present

## 2015-09-17 DIAGNOSIS — M5134 Other intervertebral disc degeneration, thoracic region: Secondary | ICD-10-CM | POA: Diagnosis not present

## 2015-09-21 DIAGNOSIS — S39012A Strain of muscle, fascia and tendon of lower back, initial encounter: Secondary | ICD-10-CM | POA: Diagnosis not present

## 2015-09-21 DIAGNOSIS — M9902 Segmental and somatic dysfunction of thoracic region: Secondary | ICD-10-CM | POA: Diagnosis not present

## 2015-09-21 DIAGNOSIS — M5134 Other intervertebral disc degeneration, thoracic region: Secondary | ICD-10-CM | POA: Diagnosis not present

## 2015-09-22 DIAGNOSIS — N401 Enlarged prostate with lower urinary tract symptoms: Secondary | ICD-10-CM | POA: Diagnosis not present

## 2015-09-22 DIAGNOSIS — R972 Elevated prostate specific antigen [PSA]: Secondary | ICD-10-CM | POA: Diagnosis not present

## 2015-09-22 DIAGNOSIS — R350 Frequency of micturition: Secondary | ICD-10-CM | POA: Diagnosis not present

## 2015-09-24 ENCOUNTER — Encounter: Payer: Self-pay | Admitting: Nurse Practitioner

## 2015-09-24 ENCOUNTER — Ambulatory Visit (INDEPENDENT_AMBULATORY_CARE_PROVIDER_SITE_OTHER): Payer: Medicare Other | Admitting: Nurse Practitioner

## 2015-09-24 VITALS — BP 122/68 | HR 89 | Temp 97.4°F | Resp 20 | Ht 65.0 in | Wt 194.8 lb

## 2015-09-24 DIAGNOSIS — H938X3 Other specified disorders of ear, bilateral: Secondary | ICD-10-CM

## 2015-09-24 DIAGNOSIS — M9902 Segmental and somatic dysfunction of thoracic region: Secondary | ICD-10-CM | POA: Diagnosis not present

## 2015-09-24 DIAGNOSIS — S39012A Strain of muscle, fascia and tendon of lower back, initial encounter: Secondary | ICD-10-CM | POA: Diagnosis not present

## 2015-09-24 DIAGNOSIS — M5134 Other intervertebral disc degeneration, thoracic region: Secondary | ICD-10-CM | POA: Diagnosis not present

## 2015-09-24 NOTE — Patient Instructions (Signed)
Keep follow up appt with Dr Mariea Clonts In April with lab work prior to visit

## 2015-09-24 NOTE — Progress Notes (Signed)
Patient ID: IMON SALAMON, male   DOB: 04-19-1941, 75 y.o.   MRN: [id number]    PCP: Hollace Kinnier, DO  Advanced Directive information Does patient have an advance directive?: Yes, Type of Advance Directive: [place], Does patient want to make changes to advanced directive?: No - Patient declined  No Known Allergies  Chief Complaint  Patient presents with  . Acute Visit    Patients c/o  Bilateral ear fullness     HPI: Patient is a 75 y.o. male seen in the office today due to fullness in both ears. Pt with a hx of DM, HTN, hyperlipidemia, arthritis, CKD, hemorrhoids.   Pt with hx of cerumen impaction and wanted to get ears checked to make sure ears were not stopped up. No pain to ears. No drainage, no hearing loss.    Review of Systems:  Review of Systems  Constitutional: Negative for fever, activity change, fatigue and unexpected weight change.  HENT: Negative for congestion, dental problem, ear discharge, ear pain, postnasal drip and sinus pressure.     Past Medical History  Diagnosis Date  . Diabetes mellitus without complication ([place])   . Hypertension   . Colon cancer ([place])     2014  . Hypercholesteremia   . Colon polyps     adenomatous  . Arthritis   . Eczema   . Chronic kidney disease     CKD- Stage  II   Past Surgical History  Procedure Laterality Date  . Colon surgery  2014 & 2015    resection of colon & removal of poylp ; Dr Kathleene Hazel, Dr Sherlene Shams  . External ear surgery  1954  . Colonoscopy  09/29/2013  . Flexible sigmoidoscopy N/A 07/21/2014    Procedure: FLEXIBLE SIGMOIDOSCOPY;  Surgeon: Inda Castle, MD;  [facility];  Service: Endoscopy;  Laterality: N/A;  . Colonoscopy with propofol N/A 03/10/2015    Procedure: COLONOSCOPY WITH PROPOFOL;  Surgeon: Inda Castle, MD;  [facility];  Service: Endoscopy;  Laterality: N/A;   Social History:   reports that he has never smoked. He has never used smokeless tobacco.  He reports that he drinks alcohol. He reports that he does not use illicit drugs.  Family History  Problem Relation Age of Onset  . Varicose Veins Mother   . Stroke Mother   . Glaucoma Mother   . Kidney disease Mother   . Hypertension Father   . Heart attack Father   . Cancer Brother     testicular cancer  . Hyperlipidemia Sister   . Meniere's disease Sister     Medications: Patient's Medications  New Prescriptions   No medications on file  Previous Medications   DOCUSATE SODIUM (COLACE) 100 MG CAPSULE    Take 1 capsule (100 mg total) by mouth daily.   GLIPIZIDE (GLIPIZIDE XL) 5 MG 24 HR TABLET    Take 1 tablet (5 mg total) by mouth daily with breakfast.   GLUCOSE BLOOD (ONE TOUCH ULTRA TEST) TEST STRIP    Check blood sugar once daily as directed for diabetes DX E11.8   LISINOPRIL (PRINIVIL,ZESTRIL) 5 MG TABLET    TAKE 1 TABLET BY MOUTH DAILY   METFORMIN (GLUCOPHAGE) 1000 MG TABLET    TAKE 1 TABLET BY MOUTH 2 TIMES DAILY WITH A MEAL.   SAXAGLIPTIN HCL (ONGLYZA) 5 MG TABS TABLET    TAKE ONE TABLET BY MOUTH ONCE DAILY WITH LARGEST MEAL TO CONTROL BLOOD SUGAR.   SIMVASTATIN (ZOCOR) 80  MG TABLET    TAKE 1 TABLET BY MOUTH EVERY EVENING FOR HYPERLIPIDEMIA  Modified Medications   No medications on file  Discontinued Medications   LISINOPRIL (PRINIVIL,ZESTRIL) 5 MG TABLET    Take 1 tablet (5 mg total) by mouth daily.   SIMVASTATIN (ZOCOR) 80 MG TABLET    TAKE 1 TABLET BY MOUTH DAILY IN THE EVENING FOR HYPERLIPEDEMIA     Physical Exam:  Filed Vitals:   09/24/15 1010  BP: 122/68  Pulse: 89  Temp: 97.4 F (36.3 C)  TempSrc: Oral  Resp: 20  Height: 5\' 5"  (1.651 m)  Weight: 194 lb 12.8 oz (88.361 kg)  SpO2: 92%   Body mass index is 32.42 kg/(m^2).  Physical Exam  Constitutional: He appears well-developed and well-nourished.  HENT:  Head: Normocephalic and atraumatic.  Right Ear: Tympanic membrane, external ear and ear canal normal.  Left Ear: Tympanic membrane, external  ear and ear canal normal.  Nose: Nose normal.  Mouth/Throat: Oropharynx is clear and moist. No oropharyngeal exudate.  Minimal wax bilaterally  Cardiovascular: Normal rate, regular rhythm and normal heart sounds.   Pulmonary/Chest: Effort normal and breath sounds normal.    Labs reviewed: Basic Metabolic Panel:  Recent Labs  09/29/14 1159 02/09/15 1154 08/12/15 0810  NA 141 140 137  K 4.8 5.4* 4.9  CL 99 99 101  CO2 21 23 22   GLUCOSE 132* 71 119*  BUN 27 34* 25  CREATININE 1.11 1.35* 1.22  CALCIUM 9.3 9.7 9.0   Liver Function Tests:  Recent Labs  09/29/14 1159  AST 18  ALT 24  ALKPHOS 102  BILITOT 0.8  PROT 7.2  ALBUMIN 4.4   No results for input(s): LIPASE, AMYLASE in the last 8760 hours. No results for input(s): AMMONIA in the last 8760 hours. CBC:  Recent Labs  09/29/14 1159 08/12/15 0810  WBC 10.8 8.3  NEUTROABS 7.2* 5.8  HGB 14.0  --   HCT 42.8 42.1  MCV 89 88  PLT  --  191   Lipid Panel:  Recent Labs  08/12/15 0810  CHOL 151  HDL 42  LDLCALC 79  TRIG 151*  CHOLHDL 3.6   TSH: No results for input(s): TSH in the last 8760 hours. A1C: Lab Results  Component Value Date   HGBA1C 6.2* 08/12/2015     Assessment/Plan 1. Ear fullness, bilateral -hx of impactions, TM visualized bilaterally, no impaction noted.  -denies hearing loss, pain or other symptoms.  -will monitor at follow up visit   Jessica K. Harle Battiest  [facility] [phone] 8 am - 5 pm) [phone] (after hours)

## 2015-09-28 DIAGNOSIS — S39012A Strain of muscle, fascia and tendon of lower back, initial encounter: Secondary | ICD-10-CM | POA: Diagnosis not present

## 2015-09-28 DIAGNOSIS — M5134 Other intervertebral disc degeneration, thoracic region: Secondary | ICD-10-CM | POA: Diagnosis not present

## 2015-09-28 DIAGNOSIS — M9902 Segmental and somatic dysfunction of thoracic region: Secondary | ICD-10-CM | POA: Diagnosis not present

## 2015-10-01 DIAGNOSIS — S39012A Strain of muscle, fascia and tendon of lower back, initial encounter: Secondary | ICD-10-CM | POA: Diagnosis not present

## 2015-10-01 DIAGNOSIS — M5134 Other intervertebral disc degeneration, thoracic region: Secondary | ICD-10-CM | POA: Diagnosis not present

## 2015-10-01 DIAGNOSIS — M9902 Segmental and somatic dysfunction of thoracic region: Secondary | ICD-10-CM | POA: Diagnosis not present

## 2015-10-05 ENCOUNTER — Encounter: Payer: Self-pay | Admitting: Internal Medicine

## 2015-10-05 ENCOUNTER — Ambulatory Visit (INDEPENDENT_AMBULATORY_CARE_PROVIDER_SITE_OTHER): Payer: Medicare Other | Admitting: Internal Medicine

## 2015-10-05 VITALS — BP 112/62 | HR 78 | Temp 98.4°F | Resp 14 | Ht 65.0 in | Wt 191.0 lb

## 2015-10-05 DIAGNOSIS — S39012A Strain of muscle, fascia and tendon of lower back, initial encounter: Secondary | ICD-10-CM | POA: Diagnosis not present

## 2015-10-05 DIAGNOSIS — M25562 Pain in left knee: Secondary | ICD-10-CM | POA: Diagnosis not present

## 2015-10-05 DIAGNOSIS — M5134 Other intervertebral disc degeneration, thoracic region: Secondary | ICD-10-CM | POA: Diagnosis not present

## 2015-10-05 DIAGNOSIS — M9902 Segmental and somatic dysfunction of thoracic region: Secondary | ICD-10-CM | POA: Diagnosis not present

## 2015-10-05 NOTE — Progress Notes (Signed)
Patient ID: Justin [name], male   DOB: 02/02/1941, 75 y.o.   MRN: [id number]   Location:  [facility] Provider: Eythan Jayne L. Mariea Clonts, D.O., C.M.D.  Goals of Care:  Advanced Directives 09/24/2015  Does patient have an advance directive? Yes  Type of Advance Directive [place]  Does patient want to make changes to advanced directive? No - Patient declined  Copy of advanced directive(s) in chart? Yes   Chief Complaint  Patient presents with  . Acute Visit    Patient c/o pain with joint on left knee x 1 month. No known injury    HPI: Patient is a 75 y.o. male seen today for an acute visit for posterior left knee pain.  Hurts all the time.  No changes with walking, position.  Has edema on back of leg.  Hurts behind knee for past 6 months.  Saw chiropractor and he recommended xray of left knee, but could not get it covered by insurance so sent pt here for order.    Past Medical History  Diagnosis Date  . Diabetes mellitus without complication ([place])   . Hypertension   . Colon cancer ([place])     2014  . Hypercholesteremia   . Colon polyps     adenomatous  . Arthritis   . Eczema   . Chronic kidney disease     CKD- Stage  II    Past Surgical History  Procedure Laterality Date  . Colon surgery  2014 & 2015    resection of colon & removal of poylp ; Dr Kathleene Hazel, Dr Sherlene Shams  . External ear surgery  1954  . Colonoscopy  09/29/2013  . Flexible sigmoidoscopy N/A 07/21/2014    Procedure: FLEXIBLE SIGMOIDOSCOPY;  Surgeon: Inda Castle, MD;  [facility];  Service: Endoscopy;  Laterality: N/A;  . Colonoscopy with propofol N/A 03/10/2015    Procedure: COLONOSCOPY WITH PROPOFOL;  Surgeon: Inda Castle, MD;  [facility];  Service: Endoscopy;  Laterality: N/A;    No Known Allergies    Medication List       This list is accurate as of: 10/05/15 12:36 PM.  Always use your most recent med list.               docusate sodium 100 MG capsule  Commonly  known as:  COLACE  Take 1 capsule (100 mg total) by mouth daily.     glipiZIDE 5 MG 24 hr tablet  Commonly known as:  GLIPIZIDE XL  Take 1 tablet (5 mg total) by mouth daily with breakfast.     glucose blood test strip  Commonly known as:  ONE TOUCH ULTRA TEST  Check blood sugar once daily as directed for diabetes DX E11.8     lisinopril 5 MG tablet  Commonly known as:  PRINIVIL,ZESTRIL  TAKE 1 TABLET BY MOUTH DAILY     metFORMIN 1000 MG tablet  Commonly known as:  GLUCOPHAGE  TAKE 1 TABLET BY MOUTH 2 TIMES DAILY WITH A MEAL.     saxagliptin HCl 5 MG Tabs tablet  Commonly known as:  ONGLYZA  TAKE ONE TABLET BY MOUTH ONCE DAILY WITH LARGEST MEAL TO CONTROL BLOOD SUGAR.     simvastatin 80 MG tablet  Commonly known as:  ZOCOR  TAKE 1 TABLET BY MOUTH EVERY EVENING FOR HYPERLIPIDEMIA        Review of Systems:  Review of Systems  Constitutional: Negative for fever, chills and malaise/fatigue.  Musculoskeletal: Positive for joint  pain. Negative for falls.  Neurological: Negative for tingling, sensory change, focal weakness and weakness.    Health Maintenance  Topic Date Due  . ZOSTAVAX  06/24/2000  . HEMOGLOBIN A1C  02/09/2016  . INFLUENZA VACCINE  03/01/2016  . OPHTHALMOLOGY EXAM  05/06/2016  . FOOT EXAM  08/12/2016  . TETANUS/TDAP  02/08/2023  . COLONOSCOPY  03/09/2025  . PNA vac Low Risk Adult  Completed    Physical Exam: Filed Vitals:   10/05/15 1212  BP: 112/62  Pulse: 78  Temp: 98.4 F (36.9 C)  TempSrc: Oral  Resp: 14  Height: 5\' 5"  (1.651 m)  Weight: 191 lb (86.637 kg)  SpO2: 97%   Body mass index is 31.78 kg/(m^2). Physical Exam  Constitutional: He is oriented to person, place, and time. He appears well-developed and well-nourished. No distress.  Musculoskeletal: Normal range of motion. He exhibits tenderness.  Right posterior knee; has not affected gait; no signs of meniscal or ligamental injury; negative homan's and no overt calf or ankle swelling  present  Neurological: He is alert and oriented to person, place, and time.  Skin: Skin is warm and dry.    Labs reviewed: Basic Metabolic Panel:  Recent Labs  02/09/15 1154 08/12/15 0810  NA 140 137  K 5.4* 4.9  CL 99 101  CO2 23 22  GLUCOSE 71 119*  BUN 34* 25  CREATININE 1.35* 1.22  CALCIUM 9.7 9.0   Liver Function Tests: No results for input(s): AST, ALT, ALKPHOS, BILITOT, PROT, ALBUMIN in the last 8760 hours. No results for input(s): LIPASE, AMYLASE in the last 8760 hours. No results for input(s): AMMONIA in the last 8760 hours. CBC:  Recent Labs  08/12/15 0810  WBC 8.3  NEUTROABS 5.8  HCT 42.1  MCV 88  PLT 191   Lipid Panel:  Recent Labs  08/12/15 0810  CHOL 151  HDL 42  LDLCALC 79  TRIG 151*  CHOLHDL 3.6   Lab Results  Component Value Date   HGBA1C 6.2* 08/12/2015    Assessment/Plan 1. Posterior left knee pain -suspect arthritis as cause -obtain xrays to r/o injury or cyst as etiology - DG Knee Complete 4 Views Left; Future -may use tylenol for pain or otc topical gels like theragesic or aspercreme  Labs/tests ordered:   Orders Placed This Encounter  Procedures  . DG Knee Complete 4 Views Left    Standing Status: Future     Number of Occurrences: 1     Standing Expiration Date: 12/04/2016    Order Specific Question:  Reason for Exam (SYMPTOM  OR DIAGNOSIS REQUIRED)    Answer:  left posterior knee pain x 6 wks, feels tight    Order Specific Question:  Preferred imaging location?    Answer:  [facility]    Next appt:  11/09/2015 med [place]. Jonus Coble, D.O. [facility] [address], [place] [address] Cell Phone (Mon-Fri 8am-5pm):  [phone] On Call:  [phone] & follow prompts after 5pm & weekends Office Phone:  [phone] Office Fax:  [phone]

## 2015-10-06 ENCOUNTER — Ambulatory Visit
Admission: RE | Admit: 2015-10-06 | Discharge: 2015-10-06 | Disposition: A | Discharge status: Home / Self Care | Payer: Medicare Other | Source: Ambulatory Visit | Attending: Internal Medicine | Admitting: Internal Medicine

## 2015-10-06 DIAGNOSIS — M25562 Pain in left knee: Secondary | ICD-10-CM

## 2015-10-06 DIAGNOSIS — M179 Osteoarthritis of knee, unspecified: Secondary | ICD-10-CM | POA: Diagnosis not present

## 2015-10-08 DIAGNOSIS — M5134 Other intervertebral disc degeneration, thoracic region: Secondary | ICD-10-CM | POA: Diagnosis not present

## 2015-10-08 DIAGNOSIS — M9902 Segmental and somatic dysfunction of thoracic region: Secondary | ICD-10-CM | POA: Diagnosis not present

## 2015-10-08 DIAGNOSIS — S39012A Strain of muscle, fascia and tendon of lower back, initial encounter: Secondary | ICD-10-CM | POA: Diagnosis not present

## 2015-10-15 ENCOUNTER — Telehealth: Payer: Self-pay | Admitting: *Deleted

## 2015-10-15 DIAGNOSIS — M25562 Pain in left knee: Secondary | ICD-10-CM

## 2015-10-15 NOTE — Telephone Encounter (Signed)
Patient daughter called and stated that the chiropractor recommended patient to go to a physical Therapist for his leg pain and he has an appointment next Thursday with Para March at [facility]. [phone]. Patient wants to pick up an order before the appointment. Please Advise.

## 2015-10-20 NOTE — Telephone Encounter (Signed)
Order was placed as external in epic.

## 2015-10-22 DIAGNOSIS — M25662 Stiffness of left knee, not elsewhere classified: Secondary | ICD-10-CM | POA: Diagnosis not present

## 2015-10-22 DIAGNOSIS — M25562 Pain in left knee: Secondary | ICD-10-CM | POA: Diagnosis not present

## 2015-10-27 ENCOUNTER — Other Ambulatory Visit: Payer: Self-pay | Admitting: Internal Medicine

## 2015-11-02 DIAGNOSIS — M25662 Stiffness of left knee, not elsewhere classified: Secondary | ICD-10-CM | POA: Diagnosis not present

## 2015-11-02 DIAGNOSIS — M25562 Pain in left knee: Secondary | ICD-10-CM | POA: Diagnosis not present

## 2015-11-05 DIAGNOSIS — M25562 Pain in left knee: Secondary | ICD-10-CM | POA: Diagnosis not present

## 2015-11-05 DIAGNOSIS — M25662 Stiffness of left knee, not elsewhere classified: Secondary | ICD-10-CM | POA: Diagnosis not present

## 2015-11-09 ENCOUNTER — Other Ambulatory Visit: Payer: Medicare Other

## 2015-11-09 DIAGNOSIS — M25662 Stiffness of left knee, not elsewhere classified: Secondary | ICD-10-CM | POA: Diagnosis not present

## 2015-11-09 DIAGNOSIS — M25562 Pain in left knee: Secondary | ICD-10-CM | POA: Diagnosis not present

## 2015-11-11 ENCOUNTER — Other Ambulatory Visit: Payer: Medicare Other

## 2015-11-11 DIAGNOSIS — Z0000 Encounter for general adult medical examination without abnormal findings: Secondary | ICD-10-CM | POA: Diagnosis not present

## 2015-11-11 DIAGNOSIS — I10 Essential (primary) hypertension: Secondary | ICD-10-CM

## 2015-11-12 ENCOUNTER — Encounter: Payer: Self-pay | Admitting: Internal Medicine

## 2015-11-12 ENCOUNTER — Ambulatory Visit (INDEPENDENT_AMBULATORY_CARE_PROVIDER_SITE_OTHER): Payer: Medicare Other | Admitting: Internal Medicine

## 2015-11-12 VITALS — BP 120/60 | HR 75 | Temp 98.0°F | Ht 65.0 in | Wt 197.0 lb

## 2015-11-12 DIAGNOSIS — M25662 Stiffness of left knee, not elsewhere classified: Secondary | ICD-10-CM | POA: Diagnosis not present

## 2015-11-12 DIAGNOSIS — I10 Essential (primary) hypertension: Secondary | ICD-10-CM | POA: Diagnosis not present

## 2015-11-12 DIAGNOSIS — K5900 Constipation, unspecified: Secondary | ICD-10-CM | POA: Diagnosis not present

## 2015-11-12 DIAGNOSIS — E118 Type 2 diabetes mellitus with unspecified complications: Secondary | ICD-10-CM

## 2015-11-12 DIAGNOSIS — M25562 Pain in left knee: Secondary | ICD-10-CM | POA: Diagnosis not present

## 2015-11-12 DIAGNOSIS — N402 Nodular prostate without lower urinary tract symptoms: Secondary | ICD-10-CM

## 2015-11-12 DIAGNOSIS — D126 Benign neoplasm of colon, unspecified: Secondary | ICD-10-CM | POA: Diagnosis not present

## 2015-11-12 DIAGNOSIS — H6123 Impacted cerumen, bilateral: Secondary | ICD-10-CM | POA: Diagnosis not present

## 2015-11-12 DIAGNOSIS — Z23 Encounter for immunization: Secondary | ICD-10-CM

## 2015-11-12 DIAGNOSIS — D509 Iron deficiency anemia, unspecified: Secondary | ICD-10-CM | POA: Diagnosis not present

## 2015-11-12 LAB — COMPREHENSIVE METABOLIC PANEL
ALT: 22 IU/L (ref 0–44)
AST: 24 IU/L (ref 0–40)
Albumin/Globulin Ratio: 1.7 (ref 1.2–2.2)
Albumin: 4.3 g/dL (ref 3.5–4.8)
Alkaline Phosphatase: 71 IU/L (ref 39–117)
BUN/Creatinine Ratio: 24 (ref 10–24)
BUN: 29 mg/dL — ABNORMAL HIGH (ref 8–27)
Bilirubin Total: 0.5 mg/dL (ref 0.0–1.2)
CO2: 23 mmol/L (ref 18–29)
Calcium: 8.7 mg/dL (ref 8.6–10.2)
Chloride: 102 mmol/L (ref 96–106)
Creatinine, Ser: 1.22 mg/dL (ref 0.76–1.27)
GFR calc Af Amer: 67 mL/min/{1.73_m2} (ref >59)
GFR calc non Af Amer: 58 mL/min/{1.73_m2} — ABNORMAL LOW (ref >59)
Globulin, Total: 2.5 g/dL (ref 1.5–4.5)
Glucose: 103 mg/dL — ABNORMAL HIGH (ref 65–99)
Potassium: 4.8 mmol/L (ref 3.5–5.2)
Sodium: 139 mmol/L (ref 134–144)
Total Protein: 6.8 g/dL (ref 6.0–8.5)

## 2015-11-12 LAB — CBC WITH DIFFERENTIAL/PLATELET
Basophils Absolute: 0 10*3/uL (ref 0.0–0.2)
Basos: 0 %
EOS (ABSOLUTE): 0.2 10*3/uL (ref 0.0–0.4)
Eos: 2 %
Hematocrit: 37.6 % (ref 37.5–51.0)
Hemoglobin: 12.5 g/dL — ABNORMAL LOW (ref 12.6–17.7)
Immature Grans (Abs): 0 10*3/uL (ref 0.0–0.1)
Immature Granulocytes: 1 %
Lymphocytes Absolute: 1.2 10*3/uL (ref 0.7–3.1)
Lymphs: 15 %
MCH: 29.3 pg (ref 26.6–33.0)
MCHC: 33.2 g/dL (ref 31.5–35.7)
MCV: 88 fL (ref 79–97)
Monocytes Absolute: 0.7 10*3/uL (ref 0.1–0.9)
Monocytes: 9 %
Neutrophils Absolute: 5.9 10*3/uL (ref 1.4–7.0)
Neutrophils: 73 %
Platelets: 183 10*3/uL (ref 150–379)
RBC: 4.26 x10E6/uL (ref 4.14–5.80)
RDW: 13.9 % (ref 12.3–15.4)
WBC: 8 10*3/uL (ref 3.4–10.8)

## 2015-11-12 LAB — LIPID PANEL
Chol/HDL Ratio: 3.2 ratio units (ref 0.0–5.0)
Cholesterol, Total: 124 mg/dL (ref 100–199)
HDL: 39 mg/dL — ABNORMAL LOW (ref >39)
LDL Calculated: 60 mg/dL (ref 0–99)
Triglycerides: 126 mg/dL (ref 0–149)
VLDL Cholesterol Cal: 25 mg/dL (ref 5–40)

## 2015-11-12 LAB — HEMOGLOBIN A1C
Est. average glucose Bld gHb Est-mCnc: 126 mg/dL
Hgb A1c MFr Bld: 6 % — ABNORMAL HIGH (ref 4.8–5.6)

## 2015-11-12 MED ORDER — ZOSTER VACCINE LIVE 19400 UNT/0.65ML ~~LOC~~ SOLR: 0.6500 mL | Freq: Once | SUBCUTANEOUS | Status: DC

## 2015-11-12 NOTE — Progress Notes (Signed)
Patient ID: Justin [name], male   DOB: May 27, 1941, 75 y.o.   MRN: [id number]   Location:  [facility] clinic Provider:  Daveyon Kitchings L. Mariea Clonts, D.O., C.M.D.  Goals of Care:  Advanced Directives 11/12/2015  Does patient have an advance directive? Yes  Type of Advance Directive [place]  Does patient want to make changes to advanced directive? -  Copy of advanced directive(s) in chart? Yes     Chief Complaint  Patient presents with  . Medical Management of Chronic Issues    3 mth follow-up    HPI: Patient is a 75 y.o. male seen today for medical management of chronic diseases.     He wants the wax in his ears checked.  They are completely full again.  HTN:  bp had been high last visit.  Normal today.    Hyperlipidemia:  LDL at goal--60  DMII with CKD:  hba1c 6.  CKD: stable with gfr 58.    Iron deficiency anemia:  Has returned--down almost 2 pts since last check three mos ago.  No blood in stool noted or dark stool per pt.  A little more weak.  Not tired so much.   H/o colon ca stage 2:  S/p cscope on 03/10/15 with a surgical anastomosis noted at approximately 20 cm from the anus. There was a blind pouch. The remainder of the colon including sigmoid descending transverse and ascending colon and cecum was normal.  Past Medical History  Diagnosis Date  . Diabetes mellitus without complication ([place])   . Hypertension   . Colon cancer ([place])     2014  . Hypercholesteremia   . Colon polyps     adenomatous  . Arthritis   . Eczema   . Chronic kidney disease     CKD- Stage  II    Past Surgical History  Procedure Laterality Date  . Colon surgery  2014 & 2015    resection of colon & removal of poylp ; Dr Kathleene Hazel, Dr Sherlene Shams  . External ear surgery  1954  . Colonoscopy  09/29/2013  . Flexible sigmoidoscopy N/A 07/21/2014    Procedure: FLEXIBLE SIGMOIDOSCOPY;  Surgeon: Inda Castle, MD;  [facility];  Service: Endoscopy;  Laterality: N/A;  . Colonoscopy  with propofol N/A 03/10/2015    Procedure: COLONOSCOPY WITH PROPOFOL;  Surgeon: Inda Castle, MD;  [facility];  Service: Endoscopy;  Laterality: N/A;    No Known Allergies    Medication List       This list is accurate as of: 11/12/15 11:03 AM.  Always use your most recent med list.               finasteride 5 MG tablet  Commonly known as:  PROSCAR     glipiZIDE 5 MG 24 hr tablet  Commonly known as:  GLIPIZIDE XL  Take 1 tablet (5 mg total) by mouth daily with breakfast.     glucose blood test strip  Commonly known as:  ONE TOUCH ULTRA TEST  Check blood sugar once daily as directed for diabetes DX E11.8     lisinopril 5 MG tablet  Commonly known as:  PRINIVIL,ZESTRIL  TAKE 1 TABLET BY MOUTH DAILY     metFORMIN 1000 MG tablet  Commonly known as:  GLUCOPHAGE  TAKE 1 TABLET BY MOUTH 2 TIMES DAILY WITH A MEAL.     ONGLYZA 5 MG Tabs tablet  Generic drug:  saxagliptin HCl  TAKE 1 TABLET BY MOUTH  DAILY WITH LARGEST MEAL TO CONTROL BLOOD SUGAR     simvastatin 80 MG tablet  Commonly known as:  ZOCOR  TAKE 1 TABLET BY MOUTH EVERY EVENING FOR HYPERLIPIDEMIA     tamsulosin 0.4 MG Caps capsule  Commonly known as:  FLOMAX        Review of Systems:  Review of Systems  Constitutional: Negative for fever, chills, weight loss and malaise/fatigue.       Weight up 6 lbs  HENT: Positive for hearing loss.        Bilateral cerumen impactions  Eyes:       Wears dark glasses  Respiratory: Negative for shortness of breath.   Cardiovascular: Negative for chest pain and leg swelling.  Gastrointestinal: Negative for abdominal pain, diarrhea, constipation, blood in stool and melena.  Genitourinary: Positive for frequency. Negative for dysuria and urgency.  Musculoskeletal: Negative for back pain and falls.  Skin: Negative for rash.  Neurological: Positive for weakness. Negative for dizziness and loss of consciousness.  Psychiatric/Behavioral: Negative for depression and  memory loss.    Health Maintenance  Topic Date Due  . ZOSTAVAX  06/24/2000  . HEMOGLOBIN A1C  02/09/2016  . INFLUENZA VACCINE  03/01/2016  . OPHTHALMOLOGY EXAM  05/06/2016  . FOOT EXAM  08/12/2016  . TETANUS/TDAP  02/08/2023  . COLONOSCOPY  03/09/2025  . PNA vac Low Risk Adult  Completed    Physical Exam: Filed Vitals:   11/12/15 1033  BP: 120/60  Pulse: 75  Temp: 98 F (36.7 C)  TempSrc: Oral  Height: 5\' 5"  (1.651 m)  Weight: 197 lb (89.359 kg)  SpO2: 97%   Body mass index is 32.78 kg/(m^2). Physical Exam  Constitutional: He is oriented to person, place, and time. He appears well-developed and well-nourished. No distress.  HENT:  Bilateral cerumen impaction; HOH  Cardiovascular: Normal rate, regular rhythm, normal heart sounds and intact distal pulses.   Pulmonary/Chest: Effort normal and breath sounds normal. No respiratory distress.  Abdominal: Soft. Bowel sounds are normal. He exhibits no distension and no mass. There is no tenderness. There is no rebound and no guarding.  Scars from prior surgery  Musculoskeletal: Normal range of motion.  Neurological: He is alert and oriented to person, place, and time.  Skin: Skin is warm and dry.  Psychiatric: He has a normal mood and affect.    Labs reviewed: Basic Metabolic Panel:  Recent Labs  02/09/15 1154 08/12/15 0810 11/11/15 0845  NA 140 137 139  K 5.4* 4.9 4.8  CL 99 101 102  CO2 23 22 23   GLUCOSE 71 119* 103*  BUN 34* 25 29*  CREATININE 1.35* 1.22 1.22  CALCIUM 9.7 9.0 8.7   Liver Function Tests:  Recent Labs  11/11/15 0845  AST 24  ALT 22  ALKPHOS 71  BILITOT 0.5  PROT 6.8  ALBUMIN 4.3   No results for input(s): LIPASE, AMYLASE in the last 8760 hours. No results for input(s): AMMONIA in the last 8760 hours. CBC:  Recent Labs  08/12/15 0810 11/11/15 0845  WBC 8.3 8.0  NEUTROABS 5.8 5.9  HCT 42.1 37.6  MCV 88 88  PLT 191 183   Lipid Panel:  Recent Labs  08/12/15 0810  11/11/15 0845  CHOL 151 124  HDL 42 39*  LDLCALC 79 60  TRIG 151* 126  CHOLHDL 3.6 3.2   Lab Results  Component Value Date   HGBA1C 6.0* 11/11/2015    Assessment/Plan 1. Essential hypertension, benign -bp is at goal  with his current medications -is on ace  2. Type 2 diabetes mellitus with complication, without long-term current use of insulin (HCC) -hba1c has been good, last 6 -cont on ace, statin, NOT on aspirin due to h/o rectal bleeding in context of his colon cancer -cont metformin, onglyza and glipizide also--no reported hypoglycemia  3. Tubular adenoma of colon -prior colon ca stage II, but last cscope in 8/16 was clear -now with dropping h/h on his labs, but no new symptoms or known hematochezia, melena -unsure if result is correct so will repeat today  4. Constipation, unspecified constipation type -chronic, tries to increase fiber in diet, had been using stool softener, but not recently  5. Prostate nodule without urinary obstruction -watchful waiting with urology at this time due to concerns that PSA had been rising  6. Anemia, iron deficiency Worsened as above, recheck - CBC with Differential/Platelet  7. Cerumen impaction, bilateral -warm water and peroxide flushes followed by debridement of cerumen with plastic loop to remove remaining pieces of hard cerumen  8. Need for zoster vaccination - zoster vaccine live, PF, (ZOSTAVAX) 29562 UNT/0.65ML injection; Inject 19,400 Units into the skin once.  Dispense: 1 each; Refill: 0 RX given to patient and explained to get at a pharmacy with a [facility]--also written on his AVS for his sister to see  Labs/tests ordered:   Orders Placed This Encounter  Procedures  . CBC with Differential/Platelet   Next appt:  02/18/2016 med mgt, come fasting for labs   Hoorain Kozakiewicz L. Darvell Monteforte, D.O. [facility] [address], [place] [address] Cell Phone (Mon-Fri 8am-5pm):   [phone] On Call:  [phone] & follow prompts after 5pm & weekends Office Phone:  [phone] Office Fax:  [phone]

## 2015-11-13 LAB — CBC WITH DIFFERENTIAL/PLATELET
Basophils Absolute: 0 10*3/uL (ref 0.0–0.2)
Basos: 0 %
EOS (ABSOLUTE): 0.2 10*3/uL (ref 0.0–0.4)
Eos: 2 %
Hematocrit: 36.8 % — ABNORMAL LOW (ref 37.5–51.0)
Hemoglobin: 12.3 g/dL — ABNORMAL LOW (ref 12.6–17.7)
Immature Grans (Abs): 0 10*3/uL (ref 0.0–0.1)
Immature Granulocytes: 0 %
Lymphocytes Absolute: 1.9 10*3/uL (ref 0.7–3.1)
Lymphs: 24 %
MCH: 29.5 pg (ref 26.6–33.0)
MCHC: 33.4 g/dL (ref 31.5–35.7)
MCV: 88 fL (ref 79–97)
Monocytes Absolute: 1 10*3/uL — ABNORMAL HIGH (ref 0.1–0.9)
Monocytes: 13 %
Neutrophils Absolute: 4.7 10*3/uL (ref 1.4–7.0)
Neutrophils: 61 %
Platelets: 189 10*3/uL (ref 150–379)
RBC: 4.17 x10E6/uL (ref 4.14–5.80)
RDW: 13.8 % (ref 12.3–15.4)
WBC: 7.9 10*3/uL (ref 3.4–10.8)

## 2015-11-16 ENCOUNTER — Other Ambulatory Visit: Payer: Self-pay | Admitting: Internal Medicine

## 2015-11-16 DIAGNOSIS — M25662 Stiffness of left knee, not elsewhere classified: Secondary | ICD-10-CM | POA: Diagnosis not present

## 2015-11-16 DIAGNOSIS — M25562 Pain in left knee: Secondary | ICD-10-CM | POA: Diagnosis not present

## 2015-11-16 DIAGNOSIS — Z85038 Personal history of other malignant neoplasm of large intestine: Secondary | ICD-10-CM

## 2015-11-18 ENCOUNTER — Other Ambulatory Visit: Payer: Medicare Other

## 2015-11-18 DIAGNOSIS — Z85038 Personal history of other malignant neoplasm of large intestine: Secondary | ICD-10-CM

## 2015-11-19 ENCOUNTER — Encounter: Payer: Self-pay | Admitting: *Deleted

## 2015-11-19 DIAGNOSIS — M25662 Stiffness of left knee, not elsewhere classified: Secondary | ICD-10-CM | POA: Diagnosis not present

## 2015-11-19 DIAGNOSIS — M25562 Pain in left knee: Secondary | ICD-10-CM | POA: Diagnosis not present

## 2015-11-19 LAB — FECAL OCCULT BLOOD, IMMUNOCHEMICAL: Fecal Occult Bld: NEGATIVE

## 2015-11-24 ENCOUNTER — Other Ambulatory Visit: Payer: Self-pay | Admitting: Internal Medicine

## 2015-11-26 DIAGNOSIS — M25562 Pain in left knee: Secondary | ICD-10-CM | POA: Diagnosis not present

## 2015-11-26 DIAGNOSIS — M25662 Stiffness of left knee, not elsewhere classified: Secondary | ICD-10-CM | POA: Diagnosis not present

## 2015-11-27 ENCOUNTER — Other Ambulatory Visit: Payer: Self-pay | Admitting: *Deleted

## 2015-11-27 ENCOUNTER — Ambulatory Visit (INDEPENDENT_AMBULATORY_CARE_PROVIDER_SITE_OTHER): Payer: Medicare Other | Admitting: Internal Medicine

## 2015-11-27 ENCOUNTER — Other Ambulatory Visit: Payer: Medicare Other

## 2015-11-27 VITALS — BP 120/76 | HR 66 | Temp 98.3°F | Resp 20

## 2015-11-27 DIAGNOSIS — D72819 Decreased white blood cell count, unspecified: Secondary | ICD-10-CM

## 2015-11-27 DIAGNOSIS — H6122 Impacted cerumen, left ear: Secondary | ICD-10-CM

## 2015-11-28 LAB — IRON AND TIBC
Iron Saturation: 25 % (ref 15–55)
Iron: 68 ug/dL (ref 38–169)
Total Iron Binding Capacity: 270 ug/dL (ref 250–450)
UIBC: 202 ug/dL (ref 111–343)

## 2015-11-28 LAB — FOLATE: Folate: 13.2 ng/mL (ref >3.0)

## 2015-11-28 LAB — FERRITIN: Ferritin: 156 ng/mL (ref 30–400)

## 2015-11-28 LAB — VITAMIN B12: Vitamin B-12: 850 pg/mL (ref 211–946)

## 2015-11-28 NOTE — Progress Notes (Signed)
Patient ID: Justin [name], male   DOB: 07-25-1941, 75 y.o.   MRN: [id number] CMA flushed patient's left ear with warm water and hydrogen peroxide after I looked in it when he came for labs c/o his left ear being full and it was again impacted with wax.

## 2015-11-30 DIAGNOSIS — M25562 Pain in left knee: Secondary | ICD-10-CM | POA: Diagnosis not present

## 2015-11-30 DIAGNOSIS — M25662 Stiffness of left knee, not elsewhere classified: Secondary | ICD-10-CM | POA: Diagnosis not present

## 2015-12-02 ENCOUNTER — Telehealth: Payer: Self-pay | Admitting: *Deleted

## 2015-12-02 NOTE — Telephone Encounter (Signed)
-----   Message from Gayland Curry, DO sent at 11/28/2015 12:13 PM EDT ----- Please inform his sister:  Iron panel, ferritin, and b12/folate are all normal.  This makes it difficult to know why he's become more anemic especially with negative hemoccult.  I will continue to follow his RED blood counts--hemoglobin and hematocrit (decreased white blood cell count was used as diagnosis but anemia was reason as per my notes if this comes back for a new code later on).

## 2015-12-02 NOTE — Telephone Encounter (Signed)
Patient's sister returned call and would like for [facility] to call her at this number: [phone]

## 2015-12-03 ENCOUNTER — Encounter: Payer: Self-pay | Admitting: *Deleted

## 2015-12-03 DIAGNOSIS — M25562 Pain in left knee: Secondary | ICD-10-CM | POA: Diagnosis not present

## 2015-12-03 DIAGNOSIS — M25662 Stiffness of left knee, not elsewhere classified: Secondary | ICD-10-CM | POA: Diagnosis not present

## 2015-12-03 NOTE — Telephone Encounter (Signed)
See result note.  

## 2015-12-11 DIAGNOSIS — M25562 Pain in left knee: Secondary | ICD-10-CM | POA: Diagnosis not present

## 2015-12-11 DIAGNOSIS — M25662 Stiffness of left knee, not elsewhere classified: Secondary | ICD-10-CM | POA: Diagnosis not present

## 2015-12-14 DIAGNOSIS — M25662 Stiffness of left knee, not elsewhere classified: Secondary | ICD-10-CM | POA: Diagnosis not present

## 2015-12-14 DIAGNOSIS — M25562 Pain in left knee: Secondary | ICD-10-CM | POA: Diagnosis not present

## 2015-12-17 DIAGNOSIS — M25662 Stiffness of left knee, not elsewhere classified: Secondary | ICD-10-CM | POA: Diagnosis not present

## 2015-12-17 DIAGNOSIS — M25562 Pain in left knee: Secondary | ICD-10-CM | POA: Diagnosis not present

## 2015-12-31 DIAGNOSIS — M25562 Pain in left knee: Secondary | ICD-10-CM | POA: Diagnosis not present

## 2015-12-31 DIAGNOSIS — M25662 Stiffness of left knee, not elsewhere classified: Secondary | ICD-10-CM | POA: Diagnosis not present

## 2016-01-01 ENCOUNTER — Other Ambulatory Visit: Payer: Medicare Other

## 2016-01-01 ENCOUNTER — Other Ambulatory Visit: Payer: Self-pay | Admitting: Internal Medicine

## 2016-01-01 DIAGNOSIS — I10 Essential (primary) hypertension: Secondary | ICD-10-CM

## 2016-01-02 LAB — CBC WITH DIFFERENTIAL/PLATELET
Basophils Absolute: 0 10*3/uL (ref 0.0–0.2)
Basos: 0 %
EOS (ABSOLUTE): 0.1 10*3/uL (ref 0.0–0.4)
Eos: 2 %
Hematocrit: 39.4 % (ref 37.5–51.0)
Hemoglobin: 13 g/dL (ref 12.6–17.7)
Immature Grans (Abs): 0 10*3/uL (ref 0.0–0.1)
Immature Granulocytes: 0 %
Lymphocytes Absolute: 1.4 10*3/uL (ref 0.7–3.1)
Lymphs: 22 %
MCH: 29.7 pg (ref 26.6–33.0)
MCHC: 33 g/dL (ref 31.5–35.7)
MCV: 90 fL (ref 79–97)
Monocytes Absolute: 0.7 10*3/uL (ref 0.1–0.9)
Monocytes: 11 %
Neutrophils Absolute: 4.3 10*3/uL (ref 1.4–7.0)
Neutrophils: 65 %
Platelets: 194 10*3/uL (ref 150–379)
RBC: 4.38 x10E6/uL (ref 4.14–5.80)
RDW: 13.4 % (ref 12.3–15.4)
WBC: 6.6 10*3/uL (ref 3.4–10.8)

## 2016-01-03 DIAGNOSIS — Z23 Encounter for immunization: Secondary | ICD-10-CM | POA: Diagnosis not present

## 2016-01-04 DIAGNOSIS — M25662 Stiffness of left knee, not elsewhere classified: Secondary | ICD-10-CM | POA: Diagnosis not present

## 2016-01-04 DIAGNOSIS — M25562 Pain in left knee: Secondary | ICD-10-CM | POA: Diagnosis not present

## 2016-01-05 ENCOUNTER — Ambulatory Visit (INDEPENDENT_AMBULATORY_CARE_PROVIDER_SITE_OTHER): Payer: Medicare Other | Admitting: Nurse Practitioner

## 2016-01-05 ENCOUNTER — Encounter: Payer: Self-pay | Admitting: Nurse Practitioner

## 2016-01-05 VITALS — BP 118/74 | HR 73 | Temp 97.6°F | Resp 17 | Ht 65.0 in | Wt 192.8 lb

## 2016-01-05 DIAGNOSIS — H938X1 Other specified disorders of right ear: Secondary | ICD-10-CM | POA: Diagnosis not present

## 2016-01-05 NOTE — Progress Notes (Signed)
Patient ID: Justin [name], male   DOB: 12/09/1940, 75 y.o.   MRN: [id number]    PCP: Hollace Kinnier, DO  Advanced Directive information Does patient have an advance directive?: Yes, Type of Advance Directive: Healthcare Power of Attorney  No Known Allergies  Chief Complaint  Patient presents with  . Medical Management of Chronic Issues    Right ear feels very clogged, left ear isn't so bad.      HPI: Patient is a 75 y.o. male seen in the office today due to fullness in rightears. Pt with a hx of DM, HTN, hyperlipidemia, arthritis, CKD, hemorrhoids.   Pt with hx of cerumen impaction and wanted to get ears checked to make sure ears were not stopped up. No pain to ears. No drainage, no hearing loss.    Review of Systems:  Review of Systems  Constitutional: Negative for fever, activity change, fatigue and unexpected weight change.  HENT: Negative for congestion, dental problem, ear discharge, ear pain, postnasal drip and sinus pressure.     Past Medical History  Diagnosis Date  . Diabetes mellitus without complication ([place])   . Hypertension   . Colon cancer ([place])     2014  . Hypercholesteremia   . Colon polyps     adenomatous  . Arthritis   . Eczema   . Chronic kidney disease     CKD- Stage  II   Past Surgical History  Procedure Laterality Date  . Colon surgery  2014 & 2015    resection of colon & removal of poylp ; Dr Kathleene Hazel, Dr Sherlene Shams  . External ear surgery  1954  . Colonoscopy  09/29/2013  . Flexible sigmoidoscopy N/A 07/21/2014    Procedure: FLEXIBLE SIGMOIDOSCOPY;  Surgeon: Inda Castle, MD;  [facility];  Service: Endoscopy;  Laterality: N/A;  . Colonoscopy with propofol N/A 03/10/2015    Procedure: COLONOSCOPY WITH PROPOFOL;  Surgeon: Inda Castle, MD;  [facility];  Service: Endoscopy;  Laterality: N/A;   Social History:   reports that he has never smoked. He has never used smokeless tobacco. He reports that he drinks alcohol. He  reports that he does not use illicit drugs.  Family History  Problem Relation Age of Onset  . Varicose Veins Mother   . Stroke Mother   . Glaucoma Mother   . Kidney disease Mother   . Hypertension Father   . Heart attack Father   . Cancer Brother     testicular cancer  . Hyperlipidemia Sister   . Meniere's disease Sister     Medications: Patient's Medications  New Prescriptions   No medications on file  Previous Medications   FINASTERIDE (PROSCAR) 5 MG TABLET    Take 5 mg by mouth daily.    GLIPIZIDE XL 5 MG 24 HR TABLET    TAKE 1 TABLET BY MOUTH DAILY WITH BREAKFAST   GLUCOSE BLOOD (ONE TOUCH ULTRA TEST) TEST STRIP    Check blood sugar once daily as directed for diabetes DX E11.8   LISINOPRIL (PRINIVIL,ZESTRIL) 5 MG TABLET    TAKE 1 TABLET BY MOUTH DAILY   METFORMIN (GLUCOPHAGE) 1000 MG TABLET    TAKE 1 TABLET BY MOUTH 2 TIMES DAILY WITH A MEAL.   ONGLYZA 5 MG TABS TABLET    TAKE 1 TABLET BY MOUTH DAILY WITH LARGEST MEAL TO CONTROL BLOOD SUGAR   SIMVASTATIN (ZOCOR) 80 MG TABLET    TAKE 1 TABLET BY MOUTH EVERY EVENING FOR HYPERLIPIDEMIA  TAMSULOSIN (FLOMAX) 0.4 MG CAPS CAPSULE       ZOSTER VACCINE LIVE, PF, (ZOSTAVAX) 02725 UNT/0.65ML INJECTION    Inject 19,400 Units into the skin once.  Modified Medications   No medications on file  Discontinued Medications   No medications on file     Physical Exam:  Filed Vitals:   01/05/16 0906  BP: 118/74  Pulse: 73  Temp: 97.6 F (36.4 C)  TempSrc: Oral  Resp: 17  Height: 5\' 5"  (1.651 m)  Weight: 192 lb 12.8 oz (87.454 kg)  SpO2: 97%   Body mass index is 32.08 kg/(m^2).  Physical Exam  Constitutional: He appears well-developed and well-nourished.  HENT:  Head: Normocephalic and atraumatic.  Right Ear: Tympanic membrane, external ear and ear canal normal.  Left Ear: Tympanic membrane, external ear and ear canal normal.  Nose: Nose normal.  Mouth/Throat: Oropharynx is clear and moist. No oropharyngeal exudate.    Minimal wax bilaterally    Labs reviewed: Basic Metabolic Panel:  Recent Labs  02/09/15 1154 08/12/15 0810 11/11/15 0845  NA 140 137 139  K 5.4* 4.9 4.8  CL 99 101 102  CO2 23 22 23   GLUCOSE 71 119* 103*  BUN 34* 25 29*  CREATININE 1.35* 1.22 1.22  CALCIUM 9.7 9.0 8.7   Liver Function Tests:  Recent Labs  11/11/15 0845  AST 24  ALT 22  ALKPHOS 71  BILITOT 0.5  PROT 6.8  ALBUMIN 4.3   No results for input(s): LIPASE, AMYLASE in the last 8760 hours. No results for input(s): AMMONIA in the last 8760 hours. CBC:  Recent Labs  11/11/15 0845 11/12/15 1125 01/01/16 1421  WBC 8.0 7.9 6.6  NEUTROABS 5.9 4.7 4.3  HCT 37.6 36.8* 39.4  MCV 88 88 90  PLT 183 189 194   Lipid Panel:  Recent Labs  08/12/15 0810 11/11/15 0845  CHOL 151 124  HDL 42 39*  LDLCALC 79 60  TRIG 151* 126  CHOLHDL 3.6 3.2   TSH: No results for input(s): TSH in the last 8760 hours. A1C: Lab Results  Component Value Date   HGBA1C 6.0* 11/11/2015     Assessment/Plan 1. Ear fullness, right -hx of impactions, TM visualized bilaterally, minimal wax in right ear canal. no impaction noted.  -no changes in hearing, no pain -will cont to monitor at this time  Carlos American. Harle Battiest  [facility] [phone] 8 am - 5 pm) [phone] (after hours)

## 2016-01-07 ENCOUNTER — Encounter: Payer: Self-pay | Admitting: *Deleted

## 2016-01-11 DIAGNOSIS — M25662 Stiffness of left knee, not elsewhere classified: Secondary | ICD-10-CM | POA: Diagnosis not present

## 2016-01-11 DIAGNOSIS — M25562 Pain in left knee: Secondary | ICD-10-CM | POA: Diagnosis not present

## 2016-01-18 DIAGNOSIS — M25562 Pain in left knee: Secondary | ICD-10-CM | POA: Diagnosis not present

## 2016-01-18 DIAGNOSIS — M25662 Stiffness of left knee, not elsewhere classified: Secondary | ICD-10-CM | POA: Diagnosis not present

## 2016-02-15 ENCOUNTER — Other Ambulatory Visit: Payer: Medicare Other

## 2016-02-18 ENCOUNTER — Ambulatory Visit: Payer: Medicare Other | Admitting: Internal Medicine

## 2016-02-25 ENCOUNTER — Telehealth: Payer: Self-pay

## 2016-02-25 NOTE — Telephone Encounter (Signed)
Makena, DO  Meshell A Simpson, RMA        Pt did not need labs. I was trying to save him a trip by doing labs at his appts. He is supposed to come fasting for his appt with me.   Previous Messages    ----- Message -----  From: Sherilyn Dacosta, RMA  Sent: 02/23/2016  3:19 PM  To: Gayland Curry, DO  Subject: Need Labs                     Patient Coming for labs this Friday need Lab orders.      Tried to call patient no answer, unable to leave Voicemail.

## 2016-02-26 ENCOUNTER — Other Ambulatory Visit: Payer: Medicare Other

## 2016-03-03 ENCOUNTER — Ambulatory Visit (INDEPENDENT_AMBULATORY_CARE_PROVIDER_SITE_OTHER): Payer: Medicare Other | Admitting: Internal Medicine

## 2016-03-03 ENCOUNTER — Encounter: Payer: Self-pay | Admitting: Internal Medicine

## 2016-03-03 VITALS — BP 120/70 | HR 63 | Temp 98.1°F | Ht 65.0 in | Wt 191.0 lb

## 2016-03-03 DIAGNOSIS — M549 Dorsalgia, unspecified: Secondary | ICD-10-CM | POA: Diagnosis not present

## 2016-03-03 DIAGNOSIS — N402 Nodular prostate without lower urinary tract symptoms: Secondary | ICD-10-CM | POA: Diagnosis not present

## 2016-03-03 DIAGNOSIS — E785 Hyperlipidemia, unspecified: Secondary | ICD-10-CM

## 2016-03-03 DIAGNOSIS — H6123 Impacted cerumen, bilateral: Secondary | ICD-10-CM | POA: Diagnosis not present

## 2016-03-03 DIAGNOSIS — D509 Iron deficiency anemia, unspecified: Secondary | ICD-10-CM

## 2016-03-03 DIAGNOSIS — I10 Essential (primary) hypertension: Secondary | ICD-10-CM

## 2016-03-03 DIAGNOSIS — N182 Chronic kidney disease, stage 2 (mild): Secondary | ICD-10-CM | POA: Diagnosis not present

## 2016-03-03 DIAGNOSIS — E1122 Type 2 diabetes mellitus with diabetic chronic kidney disease: Secondary | ICD-10-CM | POA: Diagnosis not present

## 2016-03-03 DIAGNOSIS — Z85038 Personal history of other malignant neoplasm of large intestine: Secondary | ICD-10-CM

## 2016-03-03 LAB — CBC WITH DIFFERENTIAL/PLATELET
Basophils Absolute: 0 cells/uL (ref 0–200)
Basophils Relative: 0 %
Eosinophils Absolute: 142 cells/uL (ref 15–500)
Eosinophils Relative: 2 %
HCT: 37.9 % — ABNORMAL LOW (ref 38.5–50.0)
Hemoglobin: 12.9 g/dL — ABNORMAL LOW (ref 13.2–17.1)
Lymphocytes Relative: 25 %
Lymphs Abs: 1775 cells/uL (ref 850–3900)
MCH: 29.3 pg (ref 27.0–33.0)
MCHC: 34 g/dL (ref 32.0–36.0)
MCV: 86.1 fL (ref 80.0–100.0)
MPV: 9.7 fL (ref 7.5–12.5)
Monocytes Absolute: 852 cells/uL (ref 200–950)
Monocytes Relative: 12 %
Neutro Abs: 4331 cells/uL (ref 1500–7800)
Neutrophils Relative %: 61 %
Platelets: 170 10*3/uL (ref 140–400)
RBC: 4.4 MIL/uL (ref 4.20–5.80)
RDW: 13.7 % (ref 11.0–15.0)
WBC: 7.1 10*3/uL (ref 3.8–10.8)

## 2016-03-03 LAB — COMPREHENSIVE METABOLIC PANEL
ALT: 24 U/L (ref 9–46)
AST: 24 U/L (ref 10–35)
Albumin: 4.3 g/dL (ref 3.6–5.1)
Alkaline Phosphatase: 60 U/L (ref 40–115)
BUN: 33 mg/dL — ABNORMAL HIGH (ref 7–25)
CO2: 25 mmol/L (ref 20–31)
Calcium: 9.4 mg/dL (ref 8.6–10.3)
Chloride: 103 mmol/L (ref 98–110)
Creat: 1.39 mg/dL — ABNORMAL HIGH (ref 0.70–1.18)
Glucose, Bld: 92 mg/dL (ref 65–99)
Potassium: 5.1 mmol/L (ref 3.5–5.3)
Sodium: 138 mmol/L (ref 135–146)
Total Bilirubin: 0.7 mg/dL (ref 0.2–1.2)
Total Protein: 7.2 g/dL (ref 6.1–8.1)

## 2016-03-03 NOTE — Patient Instructions (Signed)
Be sure to keep your appointment with urology for later this month or early next month to check up on your prostate.

## 2016-03-03 NOTE — Progress Notes (Signed)
Location:  [facility] clinic Provider:  Shamar Kracke L. Mariea Clonts, D.O., C.M.D.  Code Status: DNR Goals of Care:  Advanced Directives 03/03/2016  Does patient have an advance directive? Yes  Type of Advance Directive [place]  Does patient want to make changes to advanced directive? -  Copy of advanced directive(s) in chart? Yes     Chief Complaint  Patient presents with  . Medical Management of Chronic Issues    3 mth follow-up    HPI: Patient is a 75 y.o. male seen today for medical management of chronic diseases.    Cerumen impaction:  He requested his ears be cleaned out, but not much came out.    Colon cancer:  Historically had a partial colon resection after tubular adenoma.  latest cscope 03/10/15 Dr. Deatra Ina:   A surgical anastomosis was noted at approximately 20 cm from the anus. There was a blind pouch. The remainder of the colon including sigmoid descending transverse and ascending colon and cecum was normal.  Repeat in 3 years from then.  Continues with his small stools.    CKDII:  Stable.  On ace.  DMII:  Has been well controlled.   Last check was April and recheck today.  Hyperlipidemia:  Last check in April with LDL 60.    Elevated psa with prior nodule.  Saw urology last in feb and tamsulosin and proscar and PSA recheck ordered for 6 mos (was around 7).  At night, he still has to wake up to urinate.  Sometimes some urgency to it.  No difficulty starting his stream.    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  Has been going to PT for that.  Back not bothersome lately.    Past Medical History:  Diagnosis Date  . Arthritis   . Chronic kidney disease    CKD- Stage  II  . Colon cancer ([place])    2014  . Colon polyps    adenomatous  . Diabetes mellitus without complication ([place])   . Eczema   . Hypercholesteremia   . Hypertension     Past Surgical History:  Procedure Laterality Date  . COLON SURGERY  2014 & 2015   resection of colon & removal of poylp ; Dr  Kathleene Hazel, Dr Sherlene Shams  . COLONOSCOPY  09/29/2013  . COLONOSCOPY WITH PROPOFOL N/A 03/10/2015   Procedure: COLONOSCOPY WITH PROPOFOL;  Surgeon: Inda Castle, MD;  [facility];  Service: Endoscopy;  Laterality: N/A;  . EXTERNAL EAR SURGERY  1954  . FLEXIBLE SIGMOIDOSCOPY N/A 07/21/2014   Procedure: FLEXIBLE SIGMOIDOSCOPY;  Surgeon: Inda Castle, MD;  [facility];  Service: Endoscopy;  Laterality: N/A;    No Known Allergies    Medication List       Accurate as of 03/03/16 10:28 AM. Always use your most recent med list.          finasteride 5 MG tablet Commonly known as:  PROSCAR Take 5 mg by mouth daily.   GLIPIZIDE XL 5 MG 24 hr tablet Generic drug:  glipiZIDE TAKE 1 TABLET BY MOUTH DAILY WITH BREAKFAST   glucose blood test strip Commonly known as:  ONE TOUCH ULTRA TEST Check blood sugar once daily as directed for diabetes DX E11.8   lisinopril 5 MG tablet Commonly known as:  PRINIVIL,ZESTRIL TAKE 1 TABLET BY MOUTH DAILY   metFORMIN 1000 MG tablet Commonly known as:  GLUCOPHAGE TAKE 1 TABLET BY MOUTH 2 TIMES DAILY WITH A MEAL.   ONGLYZA  5 MG Tabs tablet Generic drug:  saxagliptin HCl TAKE 1 TABLET BY MOUTH DAILY WITH LARGEST MEAL TO CONTROL BLOOD SUGAR   simvastatin 80 MG tablet Commonly known as:  ZOCOR TAKE 1 TABLET BY MOUTH EVERY EVENING FOR HYPERLIPIDEMIA   tamsulosin 0.4 MG Caps capsule Commonly known as:  FLOMAX   zoster vaccine live (PF) 19400 UNT/0.65ML injection Commonly known as:  ZOSTAVAX Inject 19,400 Units into the skin once.      Review of Systems:  Review of Systems  Constitutional: Negative for chills and fever.  HENT: Positive for hearing loss.        Cerumen impaction was flushed  Respiratory: Negative for cough and shortness of breath.   Cardiovascular: Negative for chest pain, palpitations and leg swelling.  Gastrointestinal: Negative for abdominal pain, blood in stool and melena.       Small caliber stools  ongoing  Genitourinary: Positive for frequency and urgency. Negative for dysuria and hematuria.       No difficulty with starting stream  Musculoskeletal: Negative for falls.  Skin: Negative for rash.  Neurological: Negative for dizziness and loss of consciousness.  Psychiatric/Behavioral: Positive for memory loss.       Intellectual disability    Health Maintenance  Topic Date Due  . INFLUENZA VACCINE  03/01/2016  . OPHTHALMOLOGY EXAM  05/06/2016  . HEMOGLOBIN A1C  05/12/2016  . FOOT EXAM  08/12/2016  . TETANUS/TDAP  02/08/2023  . COLONOSCOPY  03/09/2025  . ZOSTAVAX  Completed  . PNA vac Low Risk Adult  Completed    Physical Exam: Vitals:   03/03/16 1005  BP: 120/70  Pulse: 63  Temp: 98.1 F (36.7 C)  TempSrc: Oral  SpO2: 97%  Weight: 191 lb (86.6 kg)  Height: 5\' 5"  (1.651 m)   Body mass index is 31.78 kg/m. Physical Exam  Constitutional: He is oriented to person, place, and time. He appears well-developed and well-nourished. No distress.  HENT:  Bilateral cerumen increased--ears flushed, but no instruments used in canals  Cardiovascular: Normal rate, regular rhythm, normal heart sounds and intact distal pulses.   Pulmonary/Chest: Effort normal and breath sounds normal. No respiratory distress.  Abdominal: Soft. Bowel sounds are normal.  Musculoskeletal: Normal range of motion.  Neurological: He is alert and oriented to person, place, and time.    Labs reviewed: Basic Metabolic Panel:  Recent Labs  08/12/15 0810 11/11/15 0845  NA 137 139  K 4.9 4.8  CL 101 102  CO2 22 23  GLUCOSE 119* 103*  BUN 25 29*  CREATININE 1.22 1.22  CALCIUM 9.0 8.7   Liver Function Tests:  Recent Labs  11/11/15 0845  AST 24  ALT 22  ALKPHOS 71  BILITOT 0.5  PROT 6.8  ALBUMIN 4.3   No results for input(s): LIPASE, AMYLASE in the last 8760 hours. No results for input(s): AMMONIA in the last 8760 hours. CBC:  Recent Labs  11/11/15 0845 11/12/15 1125  01/01/16 1421  WBC 8.0 7.9 6.6  NEUTROABS 5.9 4.7 4.3  HCT 37.6 36.8* 39.4  MCV 88 88 90  PLT 183 189 194   Lipid Panel:  Recent Labs  08/12/15 0810 11/11/15 0845  CHOL 151 124  HDL 42 39*  LDLCALC 79 60  TRIG 151* 126  CHOLHDL 3.6 3.2   Lab Results  Component Value Date   HGBA1C 6.0 (H) 11/11/2015    Assessment/Plan 1. Type 2 diabetes mellitus with stage 2 chronic kidney disease, without long-term current use of insulin (  [place]) - well controlled a few months back--recheck labs today and cont current meds - Hemoglobin [id number] - Basic metabolic panel - CBC with Differential/Platelet - Comprehensive metabolic panel; Future - Hemoglobin A1c; Future - Comprehensive metabolic panel - Hemoglobin A1c  2. Anemia, iron deficiency - cont iron supplement and monitor  - CBC with Differential/Platelet - CBC with Differential/Platelet; Future - CBC with Differential/Platelet  3. Essential hypertension, benign -bp at goal, cont same meds including ace - Basic metabolic panel  4. Mid back pain - well controlled, not bothersome now  5. Hyperlipidemia -lipids at goal with statin--cont same, recheck in 1 year  6. Personal history of colon cancer, stage II - last cscope was clear, next due August of 2019, Dr. Deatra Ina  7. Prostate nodule without urinary obstruction - is to f/u with Dr. Gaynelle Arabian mid month  - PSA done today  8. Cerumen impaction, bilateral -was flushed with hydrogen peroxide and warm water with resolution -no instrumentation done by me  Labs/tests ordered:   Orders Placed This Encounter  Procedures  . Hemoglobin A1c  . Basic metabolic panel  . CBC with Differential/Platelet  . CBC with Differential/Platelet    Standing Status:   Future    Number of Occurrences:   1    Standing Expiration Date:   09/03/2016  . Comprehensive metabolic panel    Standing Status:   Future    Number of Occurrences:   1    Standing Expiration Date:   09/03/2016  . Hemoglobin A1c     Standing Status:   Future    Number of Occurrences:   1    Standing Expiration Date:   09/03/2016  . PSA    Next appt:  06/03/2016 for med mgt, labs before (at his request)  Finley Chevez L. Juel Bellerose, D.O. [facility] [address], [place] [address] Cell Phone (Mon-Fri 8am-5pm):  [phone] On Call:  [phone] & follow prompts after 5pm & weekends Office Phone:  [phone] Office Fax:  [phone]

## 2016-03-04 ENCOUNTER — Encounter: Payer: Self-pay | Admitting: *Deleted

## 2016-03-04 ENCOUNTER — Other Ambulatory Visit: Payer: Self-pay | Admitting: Internal Medicine

## 2016-03-04 LAB — HEMOGLOBIN A1C
Hgb A1c MFr Bld: 5.9 % — ABNORMAL HIGH (ref <5.7)
Mean Plasma Glucose: 123 mg/dL

## 2016-03-04 LAB — PSA: PSA: 4.25 ng/mL — ABNORMAL HIGH (ref <=4.00)

## 2016-03-15 DIAGNOSIS — N401 Enlarged prostate with lower urinary tract symptoms: Secondary | ICD-10-CM | POA: Diagnosis not present

## 2016-03-22 DIAGNOSIS — R351 Nocturia: Secondary | ICD-10-CM | POA: Diagnosis not present

## 2016-03-22 DIAGNOSIS — R972 Elevated prostate specific antigen [PSA]: Secondary | ICD-10-CM | POA: Diagnosis not present

## 2016-03-22 DIAGNOSIS — R350 Frequency of micturition: Secondary | ICD-10-CM | POA: Diagnosis not present

## 2016-03-22 DIAGNOSIS — N401 Enlarged prostate with lower urinary tract symptoms: Secondary | ICD-10-CM | POA: Diagnosis not present

## 2016-04-05 ENCOUNTER — Telehealth: Payer: Self-pay | Admitting: *Deleted

## 2016-04-05 NOTE — Telephone Encounter (Signed)
Patient sister, Mardene Celeste called and stated that patient cannot afford his Onglyza 5mg . Patient is in doughnut hole and it cost $145.00. Wants to know if it is a medication he even needs to continue, if so, needs something else prescribed. If you think he needs to continue the Onglyza she stated that they would get a patient assistance form and fill out and drop it off at the office to see if he qualifies, if not they would get the Rx through [place].  No samples available. Please Advise.

## 2016-04-06 NOTE — Telephone Encounter (Signed)
We can try him on januvia instead if that is less costly.  Please check with the pharmacy.  I'd have to adjust his dose for his kidney function.

## 2016-04-06 NOTE — Telephone Encounter (Signed)
Justin [name], caregiver is calling the pharmacy to check cost for Januvia and will call us back.

## 2016-04-08 ENCOUNTER — Telehealth: Payer: Self-pay | Admitting: *Deleted

## 2016-04-08 NOTE — Telephone Encounter (Signed)
Received fax from [facility] [phone] Fax: [phone] a company to get medications cheaper.

## 2016-04-18 ENCOUNTER — Other Ambulatory Visit: Payer: Self-pay | Admitting: Internal Medicine

## 2016-04-19 ENCOUNTER — Other Ambulatory Visit: Payer: Self-pay | Admitting: *Deleted

## 2016-04-19 MED ORDER — SAXAGLIPTIN HCL 5 MG PO TABS: ORAL_TABLET | ORAL | 3 refills | Status: DC

## 2016-04-19 NOTE — Telephone Encounter (Signed)
Patient walked into office to have Rx called into pharmacy.

## 2016-05-05 ENCOUNTER — Other Ambulatory Visit: Payer: Self-pay | Admitting: Internal Medicine

## 2016-05-31 ENCOUNTER — Telehealth: Payer: Self-pay

## 2016-05-31 NOTE — Telephone Encounter (Signed)
I called patient to try to cancel lab appointment for 06/01/16. Patient can have labs done on day of appointment on 06/03/16 as per Dr. Mariea Clonts.   I was unable to reach patient and I could not leave a message.

## 2016-05-31 NOTE — Telephone Encounter (Signed)
I spoke with patient about the need to reschedule his labs. He rescheduled his lab visit for 06/03/16.  Patient stated that his blood sugar levels have been bouncing around between 137 to 201 for the last several days. Patient's sister did state that patient has not been eating healthy food for the last few days either. I explained that the unhealthy food choices could be adding to increased sugar levels.   Patient and patient's sister would like to know if there is anything that they can do about his levels bouncing around and could it be caused by something other than food choices.   Please advise.

## 2016-06-01 ENCOUNTER — Other Ambulatory Visit: Payer: Medicare Other

## 2016-06-01 NOTE — Telephone Encounter (Signed)
The fluctuating sugars may have another cause, but the list is very long. We are better off discussing it at his appt.  A couple of examples besides poor food selection (#1 cause) would be infections and uncontrolled pain.  We will address at his appt.

## 2016-06-03 ENCOUNTER — Encounter: Payer: Self-pay | Admitting: Internal Medicine

## 2016-06-03 ENCOUNTER — Other Ambulatory Visit: Payer: Medicare Other

## 2016-06-03 ENCOUNTER — Ambulatory Visit (INDEPENDENT_AMBULATORY_CARE_PROVIDER_SITE_OTHER): Payer: Medicare Other | Admitting: Internal Medicine

## 2016-06-03 VITALS — BP 132/70 | HR 72 | Temp 97.8°F | Ht 65.0 in | Wt 195.0 lb

## 2016-06-03 DIAGNOSIS — E118 Type 2 diabetes mellitus with unspecified complications: Secondary | ICD-10-CM

## 2016-06-03 DIAGNOSIS — N182 Chronic kidney disease, stage 2 (mild): Secondary | ICD-10-CM | POA: Diagnosis not present

## 2016-06-03 DIAGNOSIS — D500 Iron deficiency anemia secondary to blood loss (chronic): Secondary | ICD-10-CM | POA: Diagnosis not present

## 2016-06-03 DIAGNOSIS — E7800 Pure hypercholesterolemia, unspecified: Secondary | ICD-10-CM

## 2016-06-03 DIAGNOSIS — H6123 Impacted cerumen, bilateral: Secondary | ICD-10-CM | POA: Diagnosis not present

## 2016-06-03 DIAGNOSIS — N402 Nodular prostate without lower urinary tract symptoms: Secondary | ICD-10-CM | POA: Diagnosis not present

## 2016-06-03 DIAGNOSIS — I10 Essential (primary) hypertension: Secondary | ICD-10-CM | POA: Diagnosis not present

## 2016-06-03 DIAGNOSIS — Z23 Encounter for immunization: Secondary | ICD-10-CM | POA: Diagnosis not present

## 2016-06-03 DIAGNOSIS — C189 Malignant neoplasm of colon, unspecified: Secondary | ICD-10-CM | POA: Diagnosis not present

## 2016-06-03 DIAGNOSIS — E1122 Type 2 diabetes mellitus with diabetic chronic kidney disease: Secondary | ICD-10-CM | POA: Diagnosis not present

## 2016-06-03 DIAGNOSIS — D509 Iron deficiency anemia, unspecified: Secondary | ICD-10-CM | POA: Diagnosis not present

## 2016-06-03 LAB — CBC WITH DIFFERENTIAL/PLATELET
Basophils Absolute: 0 cells/uL (ref 0–200)
Basophils Relative: 0 %
Eosinophils Absolute: 70 cells/uL (ref 15–500)
Eosinophils Relative: 1 %
HCT: 41.1 % (ref 38.5–50.0)
Hemoglobin: 13.9 g/dL (ref 13.2–17.1)
Lymphocytes Relative: 24 %
Lymphs Abs: 1680 cells/uL (ref 850–3900)
MCH: 29.4 pg (ref 27.0–33.0)
MCHC: 33.8 g/dL (ref 32.0–36.0)
MCV: 87.1 fL (ref 80.0–100.0)
MPV: 9.4 fL (ref 7.5–12.5)
Monocytes Absolute: 840 cells/uL (ref 200–950)
Monocytes Relative: 12 %
Neutro Abs: 4410 cells/uL (ref 1500–7800)
Neutrophils Relative %: 63 %
Platelets: 176 10*3/uL (ref 140–400)
RBC: 4.72 MIL/uL (ref 4.20–5.80)
RDW: 13.9 % (ref 11.0–15.0)
WBC: 7 10*3/uL (ref 3.8–10.8)

## 2016-06-03 LAB — HEMOGLOBIN A1C
Hgb A1c MFr Bld: 5.9 % — ABNORMAL HIGH (ref <5.7)
Mean Plasma Glucose: 123 mg/dL

## 2016-06-03 NOTE — Progress Notes (Signed)
Location:  [facility] clinic Provider:  Roma Bondar L. Mariea Clonts, D.O., C.M.D.  Code Status: DNR Goals of Care:  Advanced Directives 03/03/2016  Does patient have an advance directive? Yes  Type of Advance Directive [place]  Does patient want to make changes to advanced directive? -  Copy of advanced directive(s) in chart? Yes   Chief Complaint  Patient presents with  . Medical Management of Chronic Issues    47mth follow-up    HPI: Patient is a 75 y.o. male seen today for medical management of chronic diseases.    He got his flu shot today.  His sugars have been fluctuating considerably and his sister was concerned about why.  Apparently, he has not been making healthy food choices.  Four days ago, it was 201.  Wed afternoon 101.  Thurs afternoon 130.  Last check was 90.  He's been eating spaghetti and meatballs.    He has his earwax problem.    He had his blood drawn earlier.    Past Medical History:  Diagnosis Date  . Arthritis   . Chronic kidney disease    CKD- Stage  II  . Colon cancer ([place])    2014  . Colon polyps    adenomatous  . Diabetes mellitus without complication ([place])   . Eczema   . Hypercholesteremia   . Hypertension     Past Surgical History:  Procedure Laterality Date  . COLON SURGERY  2014 & 2015   resection of colon & removal of poylp ; Dr Kathleene Hazel, Dr Sherlene Shams  . COLONOSCOPY  09/29/2013  . COLONOSCOPY WITH PROPOFOL N/A 03/10/2015   Procedure: COLONOSCOPY WITH PROPOFOL;  Surgeon: Inda Castle, MD;  Location: [facility];  Service: Endoscopy;  Laterality: N/A;  . EXTERNAL EAR SURGERY  1954  . FLEXIBLE SIGMOIDOSCOPY N/A 07/21/2014   Procedure: FLEXIBLE SIGMOIDOSCOPY;  Surgeon: Inda Castle, MD;  Location: [facility];  Service: Endoscopy;  Laterality: N/A;    No Known Allergies    Medication List       Accurate as of 06/03/16 11:08 AM. Always use your most recent med list.          finasteride 5 MG tablet Commonly known as:   PROSCAR Take 5 mg by mouth daily.   GLIPIZIDE XL 5 MG 24 hr tablet Generic drug:  glipiZIDE TAKE 1 TABLET BY MOUTH DAILY WITH BREAKFAST   glucose blood test strip Commonly known as:  ONE TOUCH ULTRA TEST Check blood sugar once daily as directed for diabetes DX E11.8   lisinopril 5 MG tablet Commonly known as:  PRINIVIL,ZESTRIL TAKE 1 TABLET BY MOUTH DAILY   metFORMIN 1000 MG tablet Commonly known as:  GLUCOPHAGE TAKE 1 TABLET BY MOUTH 2 TIMES DAILY WITH A MEAL.   saxagliptin HCl 5 MG Tabs tablet Commonly known as:  ONGLYZA Take one tablet by mouth once daily with largest meal to control blood sugar   simvastatin 80 MG tablet Commonly known as:  ZOCOR TAKE 1 TABLET BY MOUTH EVERY EVENING FOR HYPERLIPIDEMIA   tamsulosin 0.4 MG Caps capsule Commonly known as:  FLOMAX       Review of Systems:  Review of Systems  Constitutional: Negative for chills, fever and malaise/fatigue.  HENT: Positive for hearing loss.        Chronic cerumen overproduction  Eyes: Positive for blurred vision.  Respiratory: Negative for shortness of breath.   Cardiovascular: Negative for chest pain, palpitations and leg swelling.  Gastrointestinal: Negative for  abdominal pain, blood in stool, constipation and melena.       Sore bottom, sits on pillow  Genitourinary: Negative for dysuria.  Musculoskeletal: Negative for back pain, falls, joint pain and myalgias.  Skin: Negative for itching and rash.  Neurological: Negative for dizziness, loss of consciousness and weakness.  Psychiatric/Behavioral: Negative for depression and memory loss.    Health Maintenance  Topic Date Due  . OPHTHALMOLOGY EXAM  05/06/2016  . FOOT EXAM  08/12/2016  . HEMOGLOBIN A1C  09/03/2016  . TETANUS/TDAP  02/08/2023  . COLONOSCOPY  03/09/2025  . INFLUENZA VACCINE  Completed  . ZOSTAVAX  Completed  . PNA vac Low Risk Adult  Completed    Physical Exam: Vitals:   06/03/16 1049  BP: 132/70  Pulse: 72  Temp: 97.8 F  (36.6 C)  TempSrc: Oral  SpO2: 98%  Weight: 195 lb (88.5 kg)  Height: 5\' 5"  (1.651 m)   Body mass index is 32.45 kg/m. Physical Exam  Constitutional: He is oriented to person, place, and time. He appears well-developed and well-nourished. No distress.  HENT:  Head: Normocephalic and atraumatic.  HOH, bilateral cerumen impaction  Eyes:  Wears sunglasses  Cardiovascular: Normal rate, regular rhythm, normal heart sounds and intact distal pulses.   Pulmonary/Chest: Effort normal and breath sounds normal. No respiratory distress.  Abdominal: Soft. Bowel sounds are normal.  Musculoskeletal: Normal range of motion.  Neurological: He is alert and oriented to person, place, and time.  Skin: Skin is warm and dry.  Psychiatric: He has a normal mood and affect.    Labs reviewed: Basic Metabolic Panel:  Recent Labs  08/12/15 0810 11/11/15 0845 03/03/16 0001  NA 137 139 138  K 4.9 4.8 5.1  CL 101 102 103  CO2 22 23 25   GLUCOSE 119* 103* 92  BUN 25 29* 33*  CREATININE 1.22 1.22 1.39*  CALCIUM 9.0 8.7 9.4   Liver Function Tests:  Recent Labs  11/11/15 0845 03/03/16 0001  AST 24 24  ALT 22 24  ALKPHOS 71 60  BILITOT 0.5 0.7  PROT 6.8 7.2  ALBUMIN 4.3 4.3   No results for input(s): LIPASE, AMYLASE in the last 8760 hours. No results for input(s): AMMONIA in the last 8760 hours. CBC:  Recent Labs  11/12/15 1125 01/01/16 1421 03/03/16 0001  WBC 7.9 6.6 7.1  NEUTROABS 4.7 4.3 4,331  HGB  --   --  12.9*  HCT 36.8* 39.4 37.9*  MCV 88 90 86.1  PLT 189 194 170   Lipid Panel:  Recent Labs  08/12/15 0810 11/11/15 0845  CHOL 151 124  HDL 42 39*  LDLCALC 79 60  TRIG 151* 126  CHOLHDL 3.6 3.2   Lab Results  Component Value Date   HGBA1C 5.9 (H) 03/03/2016    Assessment/Plan 1. Type 2 diabetes mellitus with complication, without long-term current use of insulin (HCC) - he reports his glucose trending upward -he had no signs of acute illness or uncontrolled  pain to cause the glucose to rise -he has been eating spaghetti and meatballs lately which could certainly cause hyperglycemia -await hba1c result from earlier today and cont metformin, onglyza and glipizide for now - Hemoglobin A1c; Future - Basic metabolic panel; Future  2. Prostate nodule without urinary obstruction -pt and family opted not to perform any aggressive treatments for his increasing PSA and just to monitor him and the levels Lab Results  Component Value Date   PSA 4.25 (H) 03/03/2016   PSA 4.9 (  H) 05/12/2014    3. Essential hypertension, benign -bp at goal so cont same regimen and monitor - CBC with Differential/Platelet; Future  4. Malignant neoplasm of colon, unspecified part of colon ([place]) -cscope was 03/10/15 with Dr. Deatra Ina which revealed a surgical anastomosis 20cm from the anus, a blind pouch, remainder of colon was normal--repeat cscope recommended in 3 years  5. Iron deficiency anemia due to chronic blood loss - cont to monitor h/h - CBC with Differential/Platelet; Future  6. Pure hypercholesterolemia - cont current zocor therapy and f/u lipid panel - Lipid panel; Future  7. 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 Need for immunization against influenza - Flu Vaccine QUAD 36+ mos PF IM (Fluarix & Fluzone Quad PF)  Labs/tests ordered:   Orders Placed This Encounter  Procedures  . Flu Vaccine QUAD 36+ mos PF IM (Fluarix & Fluzone Quad PF)  . CBC with Differential/Platelet    Standing Status:   Future    Standing Expiration Date:   12/01/2016  . Lipid panel    Standing Status:   Future    Standing Expiration Date:   12/01/2016    Order Specific Question:   Has the patient fasted?    Answer:   Yes  . Hemoglobin A1c    Standing Status:   Future    Standing Expiration Date:   12/01/2016  . Basic metabolic panel    Standing Status:   Future    Standing Expiration Date:   12/01/2016    Order  Specific Question:   Has the patient fasted?    Answer:   Yes    Next appt:  09/19/2016 med mgt, labs before  Murrel Freet L. Perfecto Purdy, D.O. [facility] [address], [place] [address] Cell Phone (Mon-Fri 8am-5pm):  [phone] On Call:  [phone] & follow prompts after 5pm & weekends Office Phone:  [phone] Office Fax:  [phone]

## 2016-06-04 LAB — BASIC METABOLIC PANEL
BUN: 33 mg/dL — ABNORMAL HIGH (ref 7–25)
CO2: 22 mmol/L (ref 20–31)
Calcium: 9.2 mg/dL (ref 8.6–10.3)
Chloride: 103 mmol/L (ref 98–110)
Creat: 1.38 mg/dL — ABNORMAL HIGH (ref 0.70–1.18)
Glucose, Bld: 97 mg/dL (ref 65–99)
Potassium: 4.9 mmol/L (ref 3.5–5.3)
Sodium: 137 mmol/L (ref 135–146)

## 2016-06-06 ENCOUNTER — Encounter: Payer: Self-pay | Admitting: *Deleted

## 2016-07-04 ENCOUNTER — Other Ambulatory Visit: Payer: Self-pay | Admitting: *Deleted

## 2016-07-04 MED ORDER — GLUCOSE BLOOD VI STRP: ORAL_STRIP | 11 refills | Status: DC

## 2016-07-04 NOTE — Telephone Encounter (Signed)
[employer] [facility] 

## 2016-08-02 ENCOUNTER — Telehealth: Payer: Self-pay

## 2016-08-02 NOTE — Telephone Encounter (Signed)
I called patient twice today but I was unable to reach patient either time. I kept getting a message saying that the "customer is unavailable at this time". I was unable to leave a message.

## 2016-08-02 NOTE — Telephone Encounter (Signed)
I called patient to see about setting up an earlier follow up appointment at Dr. Cyndi Lennert request. Patient did not answer and I was unable to leave a message because I received an automated response that stated the was not taking calls at this time. Will try again later today.

## 2016-08-02 NOTE — Telephone Encounter (Signed)
-----   Message -----  From: Gayland Curry, DO  Sent: 07/31/2016 10:29 AM  To: Despina Hidden, CMA  Subject: FW: Non-Urgent Medical Question           Karena Addison,  Can we call [name] and see if he's willing to come in for an appointment? His sister would like me to try him on an antidepressant. He will be unlikely to come for that reason, so let's say that we would just like to check up on him a little sooner. He will keep his physical appt also.  TReed  ----- Message -----  From: Mardene Celeste De-Beer  Sent: 07/29/2016  4:01 PM  To: Gayland Curry, DO  Subject: RE: Non-Urgent Medical Question           Tiffany,  He is angry with [place] because I told him he could not afford to move to [place], and so he is even less co-operative, or "persaudable" than usual, which is not much to begin with. Philander has been asked those questions before and he just dismisses them. He does not have much reflective ability. He says what he wants to be true. (like he does when asked if he has any hearing problems!)    I guess there is no way to give him something without naming it anti depression and see if it works?  ----- Message -----  From: Hollace Kinnier, DO  Sent: 07/29/2016 12:57 PM EST  To: Mardene Celeste De-Beer  Subject: RE: Non-Urgent Medical Question  [name],  Certainly, we can address Kailon's depression at his annual visit, but, if you can convince him, he may be able to come in sooner just for evaluation of his mood (call and check with our front desk). We can ask him a series of questions. We don't have any labs that determine chemical levels, but I can also rule out reasons in labwork that might make him seem more depressed, as well like thyroid or vitamin abnormalities. Looking forward to seeing him again.  TReed      ----- Message -----   From: Mardene Celeste De-Beer   Sent: 07/28/2016 7:33 PM EST    To: Hollace Kinnier, DO  Subject: Non-Urgent Medical Question    Tiffany,  Thanks for today. I forgot to talk to you about my brother Venard Lescarbeau. He has seemed depressed for a while, but especially so recently. He told us he wants to move back to [place]. We told him he cannot afford to live on his own. He said he thought our family friends would take him in. This weekend that fantasy was removed, as our friend phoned to explain to Karsyn they could not do that.     Millie says he is not depressed, when asked. But he does not want to explore what things might make his life better here in [place]. I am sure in his heart of hearts, he knows he cannot make a move, but at the same time he desperately wants to believe that he is quite capable of being independent. Is there some way you could screen for depression chemically? Or see if he would respond well to a low dosage of an anti depressant?  Perhaps we need to discuss this at our February annual visit? or you might be able to write to me sooner.  [name]

## 2016-08-04 NOTE — Telephone Encounter (Signed)
.  left message to have patient return my call.  

## 2016-08-08 NOTE — Telephone Encounter (Signed)
Pt on schedule for thursday

## 2016-08-11 ENCOUNTER — Encounter: Payer: Self-pay | Admitting: Internal Medicine

## 2016-08-11 ENCOUNTER — Ambulatory Visit (INDEPENDENT_AMBULATORY_CARE_PROVIDER_SITE_OTHER): Payer: Medicare Other | Admitting: Internal Medicine

## 2016-08-11 VITALS — BP 120/60 | HR 69 | Temp 97.6°F | Wt 198.0 lb

## 2016-08-11 DIAGNOSIS — I10 Essential (primary) hypertension: Secondary | ICD-10-CM | POA: Diagnosis not present

## 2016-08-11 DIAGNOSIS — F4321 Adjustment disorder with depressed mood: Secondary | ICD-10-CM | POA: Diagnosis not present

## 2016-08-11 DIAGNOSIS — E118 Type 2 diabetes mellitus with unspecified complications: Secondary | ICD-10-CM | POA: Diagnosis not present

## 2016-08-11 NOTE — Progress Notes (Signed)
Location:  [facility] clinic Provider: Adeja Sarratt L. Mariea Clonts, D.O., C.M.D.  Goals of Care:  Advanced Directives 03/03/2016  Does Patient Have a Medical Advance Directive? Yes  Type of Advance Directive [place]  Does patient want to make changes to medical advance directive? -  Copy of [place] in Chart? Yes   Chief Complaint  Patient presents with  . Follow-up    check patient's mood    HPI: Patient is a 76 y.o. male seen today for an acute visit for possible depression. His sister reported that patient wanted to move to [place] with friends and was very upset when he learned that was not a possibility.  He continues to answer NO to all PHQ9 questions.   He wants to move a living facility up [place] and wants to go deep sea fishing there. 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 Used to do that once a month during the season.                   He continues to deny depression.  He admits to disappointment about not moving and seems that he has not fully accepted this fact as of yet.  He is adamant that he is not depressed or sad and that this has not affected him on a day to day basis.                      Sugar 121.   BP at goal.  Past Medical History:  Diagnosis Date  . Arthritis   . Chronic kidney disease    CKD- Stage  II  . Colon cancer ([place])    2014  . Colon polyps    adenomatous  . Diabetes mellitus without complication ([place])   . Eczema   . Hypercholesteremia   . Hypertension     Past Surgical History:  Procedure Laterality Date  . COLON SURGERY  2014 & 2015   resection of colon & removal of poylp ; Dr Kathleene Hazel, Dr Sherlene Shams  . COLONOSCOPY  09/29/2013  . COLONOSCOPY WITH PROPOFOL N/A 03/10/2015   Procedure: COLONOSCOPY WITH PROPOFOL;  Surgeon: Inda Castle, MD;  [facility]: [facility];  Service: Endoscopy;  Laterality: N/A;  . EXTERNAL EAR SURGERY  1954  . FLEXIBLE SIGMOIDOSCOPY N/A 07/21/2014   Procedure: FLEXIBLE SIGMOIDOSCOPY;  Surgeon:  Inda Castle, MD;  [facility]: [facility];  Service: Endoscopy;  Laterality: N/A;    No Known Allergies  Allergies as of 08/11/2016   No Known Allergies     Medication List       Accurate as of 08/11/16  4:05 PM. Always use your most recent med list.          finasteride 5 MG tablet Commonly known as:  PROSCAR Take 5 mg by mouth daily.   GLIPIZIDE XL 5 MG 24 hr tablet Generic drug:  glipiZIDE TAKE 1 TABLET BY MOUTH DAILY WITH BREAKFAST   glucose blood test strip Commonly known as:  ONE TOUCH ULTRA TEST Check blood sugar once daily as directed for diabetes DX E11.8   lisinopril 5 MG tablet Commonly known as:  PRINIVIL,ZESTRIL TAKE 1 TABLET BY MOUTH DAILY   metFORMIN 1000 MG tablet Commonly known as:  GLUCOPHAGE TAKE 1 TABLET BY MOUTH 2 TIMES DAILY WITH A MEAL.   saxagliptin HCl 5 MG Tabs tablet Commonly known as:  ONGLYZA Take one tablet by mouth once daily with largest meal to control blood  sugar   simvastatin 80 MG tablet Commonly known as:  ZOCOR TAKE 1 TABLET BY MOUTH EVERY EVENING FOR HYPERLIPIDEMIA   tamsulosin 0.4 MG Caps capsule Commonly known as:  FLOMAX       Review of Systems:  Review of Systems  Constitutional: Negative for chills and fever.       Poor hygiene, incontinence  HENT: Positive for hearing loss.   Eyes:       Glasses  Respiratory: Negative for shortness of breath.   Cardiovascular: Negative for chest pain, palpitations and leg swelling.  Gastrointestinal: Positive for constipation. Negative for abdominal pain.  Genitourinary: Negative for dysuria.  Musculoskeletal: Negative for falls.  Skin: Negative for rash.  Psychiatric/Behavioral:       Admits to being unhappy about not being able to move, but no signs of clinical depression    Health Maintenance  Topic Date Due  . OPHTHALMOLOGY EXAM  05/06/2016  . FOOT EXAM  08/12/2016  . HEMOGLOBIN A1C  12/01/2016  . TETANUS/TDAP  02/08/2023  . INFLUENZA VACCINE  Completed  .  ZOSTAVAX  Completed  . PNA vac Low Risk Adult  Completed    Physical Exam: Vitals:   08/11/16 1521  BP: 120/60  Pulse: 69  Temp: 97.6 F (36.4 C)  TempSrc: Oral  SpO2: 96%  Weight: 198 lb (89.8 kg)   Body mass index is 32.95 kg/m. Physical Exam  Constitutional: He is oriented to person, place, and time. He appears well-developed and well-nourished. No distress.  Hygiene poor and clothes dirty  Cardiovascular: Normal rate, regular rhythm, normal heart sounds and intact distal pulses.   Pulmonary/Chest: Effort normal. No respiratory distress.  A few expiratory wheezes  Abdominal: Bowel sounds are normal.  Musculoskeletal: Normal range of motion.  Neurological: He is alert and oriented to person, place, and time.  Skin: Skin is warm and dry.    Labs reviewed: Basic Metabolic Panel:  Recent Labs  11/11/15 0845 03/03/16 0001 06/03/16 0814  NA 139 138 137  K 4.8 5.1 4.9  CL 102 103 103  CO2 23 25 22   GLUCOSE 103* 92 97  BUN 29* 33* 33*  CREATININE 1.22 1.39* 1.38*  CALCIUM 8.7 9.4 9.2   Liver Function Tests:  Recent Labs  11/11/15 0845 03/03/16 0001  AST 24 24  ALT 22 24  ALKPHOS 71 60  BILITOT 0.5 0.7  PROT 6.8 7.2  ALBUMIN 4.3 4.3   No results for input(s): LIPASE, AMYLASE in the last 8760 hours. No results for input(s): AMMONIA in the last 8760 hours. CBC:  Recent Labs  01/01/16 1421 03/03/16 0001 06/03/16 0814  WBC 6.6 7.1 7.0  NEUTROABS 4.3 4,331 4,410  HGB  --  12.9* 13.9  HCT 39.4 37.9* 41.1  MCV 90 86.1 87.1  PLT 194 170 176   Lipid Panel:  Recent Labs  11/11/15 0845  CHOL 124  HDL 39*  LDLCALC 60  TRIG 126  CHOLHDL 3.2   Lab Results  Component Value Date   HGBA1C 5.9 (H) 06/03/2016    Assessment/Plan 1. Adjustment disorder with depressed mood -difficult to treat him when he does not admit to any changes in his daily routine or any true signs of depression -only some sadness about not being able to move and lack of  acceptance  2. Type 2 diabetes mellitus with complication, without long-term current use of insulin (HCC) -has been well controlled and latest glucose values satisfactory  3. Essential hypertension, benign -bp at goal with  current therapy, no changes  Labs/tests ordered:  No new, keep as planned Next appt:  09/15/2016  Ovella Manygoats L. Savyon Loken, D.O. [facility] [address], [place] [address] Cell Phone (Mon-Fri 8am-5pm):  [phone] On Call:  [phone] & follow prompts after 5pm & weekends Office Phone:  [phone] Office Fax:  [phone]

## 2016-08-20 ENCOUNTER — Other Ambulatory Visit: Payer: Self-pay | Admitting: Internal Medicine

## 2016-08-22 ENCOUNTER — Other Ambulatory Visit: Payer: Self-pay | Admitting: *Deleted

## 2016-08-22 MED ORDER — LISINOPRIL 5 MG PO TABS: 5.0000 mg | ORAL_TABLET | Freq: Every day | ORAL | 4 refills | Status: DC

## 2016-08-22 NOTE — Telephone Encounter (Signed)
[employer] [facility] 

## 2016-08-24 ENCOUNTER — Other Ambulatory Visit: Payer: Self-pay | Admitting: *Deleted

## 2016-08-24 MED ORDER — GLIPIZIDE ER 5 MG PO TB24: 5.0000 mg | ORAL_TABLET | Freq: Every day | ORAL | 4 refills | Status: DC

## 2016-08-24 NOTE — Telephone Encounter (Signed)
[employer] [facility] 

## 2016-09-15 ENCOUNTER — Other Ambulatory Visit: Payer: Medicare Other

## 2016-09-15 DIAGNOSIS — D500 Iron deficiency anemia secondary to blood loss (chronic): Secondary | ICD-10-CM

## 2016-09-15 DIAGNOSIS — I10 Essential (primary) hypertension: Secondary | ICD-10-CM | POA: Diagnosis not present

## 2016-09-15 DIAGNOSIS — E118 Type 2 diabetes mellitus with unspecified complications: Secondary | ICD-10-CM | POA: Diagnosis not present

## 2016-09-15 DIAGNOSIS — E7800 Pure hypercholesterolemia, unspecified: Secondary | ICD-10-CM | POA: Diagnosis not present

## 2016-09-15 LAB — CBC WITH DIFFERENTIAL/PLATELET
Basophils Absolute: 65 cells/uL (ref 0–200)
Basophils Relative: 1 %
Eosinophils Absolute: 130 cells/uL (ref 15–500)
Eosinophils Relative: 2 %
HCT: 40.1 % (ref 38.5–50.0)
Hemoglobin: 13 g/dL — ABNORMAL LOW (ref 13.2–17.1)
Lymphocytes Relative: 24 %
Lymphs Abs: 1560 cells/uL (ref 850–3900)
MCH: 28.6 pg (ref 27.0–33.0)
MCHC: 32.4 g/dL (ref 32.0–36.0)
MCV: 88.1 fL (ref 80.0–100.0)
MPV: 9.8 fL (ref 7.5–12.5)
Monocytes Absolute: 650 cells/uL (ref 200–950)
Monocytes Relative: 10 %
Neutro Abs: 4095 cells/uL (ref 1500–7800)
Neutrophils Relative %: 63 %
Platelets: 164 10*3/uL (ref 140–400)
RBC: 4.55 MIL/uL (ref 4.20–5.80)
RDW: 14 % (ref 11.0–15.0)
WBC: 6.5 10*3/uL (ref 3.8–10.8)

## 2016-09-15 LAB — BASIC METABOLIC PANEL
BUN: 33 mg/dL — ABNORMAL HIGH (ref 7–25)
CO2: 23 mmol/L (ref 20–31)
Calcium: 9 mg/dL (ref 8.6–10.3)
Chloride: 103 mmol/L (ref 98–110)
Creat: 1.37 mg/dL — ABNORMAL HIGH (ref 0.70–1.18)
Glucose, Bld: 92 mg/dL (ref 65–99)
Potassium: 4.9 mmol/L (ref 3.5–5.3)
Sodium: 137 mmol/L (ref 135–146)

## 2016-09-15 LAB — LIPID PANEL
Cholesterol: 141 mg/dL (ref <200)
HDL: 42 mg/dL (ref >40)
LDL Cholesterol: 70 mg/dL (ref <100)
Total CHOL/HDL Ratio: 3.4 Ratio (ref <5.0)
Triglycerides: 147 mg/dL (ref <150)
VLDL: 29 mg/dL (ref <30)

## 2016-09-16 ENCOUNTER — Encounter: Payer: Self-pay | Admitting: *Deleted

## 2016-09-16 LAB — HEMOGLOBIN A1C
Hgb A1c MFr Bld: 6.1 % — ABNORMAL HIGH (ref <5.7)
Mean Plasma Glucose: 128 mg/dL

## 2016-09-19 ENCOUNTER — Ambulatory Visit (INDEPENDENT_AMBULATORY_CARE_PROVIDER_SITE_OTHER): Payer: Medicare Other

## 2016-09-19 ENCOUNTER — Encounter: Payer: Self-pay | Admitting: Internal Medicine

## 2016-09-19 ENCOUNTER — Ambulatory Visit (INDEPENDENT_AMBULATORY_CARE_PROVIDER_SITE_OTHER): Payer: Medicare Other | Admitting: Internal Medicine

## 2016-09-19 VITALS — BP 150/70 | HR 77 | Temp 98.3°F | Ht 65.0 in | Wt 199.2 lb

## 2016-09-19 VITALS — BP 148/70 | HR 71 | Temp 98.3°F | Ht 65.0 in | Wt 199.0 lb

## 2016-09-19 DIAGNOSIS — I10 Essential (primary) hypertension: Secondary | ICD-10-CM | POA: Diagnosis not present

## 2016-09-19 DIAGNOSIS — H6123 Impacted cerumen, bilateral: Secondary | ICD-10-CM | POA: Diagnosis not present

## 2016-09-19 DIAGNOSIS — E7800 Pure hypercholesterolemia, unspecified: Secondary | ICD-10-CM

## 2016-09-19 DIAGNOSIS — N402 Nodular prostate without lower urinary tract symptoms: Secondary | ICD-10-CM | POA: Diagnosis not present

## 2016-09-19 DIAGNOSIS — Z0000 Encounter for general adult medical examination without abnormal findings: Secondary | ICD-10-CM

## 2016-09-19 DIAGNOSIS — D500 Iron deficiency anemia secondary to blood loss (chronic): Secondary | ICD-10-CM | POA: Diagnosis not present

## 2016-09-19 DIAGNOSIS — C189 Malignant neoplasm of colon, unspecified: Secondary | ICD-10-CM

## 2016-09-19 DIAGNOSIS — E118 Type 2 diabetes mellitus with unspecified complications: Secondary | ICD-10-CM

## 2016-09-19 MED ORDER — FINASTERIDE 5 MG PO TABS: 5.0000 mg | ORAL_TABLET | Freq: Every day | ORAL | 3 refills | Status: DC

## 2016-09-19 NOTE — Patient Instructions (Signed)
Please schedule an appointment with Dr. Kellie Moor office for your annual diabetic eye exam.  I put in a referral to [facility] for podiatry--they can trim your toenails to prevent injury with your diabetes.  We flushed your ears again today.

## 2016-09-19 NOTE — Progress Notes (Signed)
   I reviewed health advisor's note, was available for consultation and agree with the assessment and plan as written.    Styles Fambro L. Copelan Maultsby, D.O. [facility] [address],  [address] Cell Phone (Mon-Fri 8am-5pm):  [phone] On Call:  [phone] & follow prompts after 5pm & weekends Office Phone:  [phone] Office Fax:  [phone]   Quick Notes   Health Maintenance:  Due for eye exam (pt to call and set up appointment) and foot exam.     Abnormal Screen: MMSE 25/30 Failed Clock test    Patient Concerns:  Pt needs a refill on the finasteride and he requests to have his ears cleaned.     Nurse Concerns:  None

## 2016-09-19 NOTE — Progress Notes (Signed)
Provider:  Rexene Edison. Mariea Clonts, D.O., C.M.D. Location:   [place] of Service:   clinic  Previous PCP: Hollace Kinnier, DO Patient Care Team: Gayland Curry, DO as PCP - General (Geriatric Medicine)  Extended Emergency Contact Information Primary Emergency Contact: Debeer,Patricia Address: [address]          [place], [place] [address] Johnnette Litter of [place] Phone: [phone] Relation: Sister  Code Status: DNR Goals of Care: Advanced Directive information Advanced Directives 09/19/2016  Does Patient Have a Medical Advance Directive? Yes  Type of Advance Directive [place]  Does patient want to make changes to medical advance directive? -  Copy of [place] in Chart? -   Chief Complaint  Patient presents with  . Annual Exam    CPE    HPI: Patient is a 76 y.o. [name] seen today for an annual physical exam.  He lives with his sister, is doing well today. In November, discussed possible depression. Patient denies this today. Mood is positive, and happy.   Type 2 Dm (taking onglyza 5 mg with largest meal, metformin 1000 mg BID, glipizide 5 mg 24 hr tablet) sugars have been variant over past 3 months (as high as 240), yesterday was 98. "A little bit of exercise" uses exercise bike, a couple times a week, 10-15 mins. Not really watching diet. A1C 6.1 (was 5.9- 3 months ago).   BP 150/70 today, taking 5 mg lisinopril, has already taken today. Creat 1.37, BUN 33 (unchanged).   Denies any problems or concerns. No aches or pain. No sickness, has stayed well.   Past Medical History:  Diagnosis Date  . Arthritis   . Chronic kidney disease    CKD- Stage  II  . Colon cancer ([place])    2014  . Colon polyps    adenomatous  . Diabetes mellitus without complication ([place])   . Eczema   . Hypercholesteremia   . Hypertension    Past Surgical History:  Procedure Laterality Date  . COLON SURGERY  2014 & 2015   resection of colon & removal of  poylp ; Dr Kathleene Hazel, Dr Sherlene Shams  . COLONOSCOPY  09/29/2013  . COLONOSCOPY WITH PROPOFOL N/A 03/10/2015   Procedure: COLONOSCOPY WITH PROPOFOL;  Surgeon: Inda Castle, MD;  Location: [facility];  Service: Endoscopy;  Laterality: N/A;  . EXTERNAL EAR SURGERY  1954  . FLEXIBLE SIGMOIDOSCOPY N/A 07/21/2014   Procedure: FLEXIBLE SIGMOIDOSCOPY;  Surgeon: Inda Castle, MD;  Location: [facility];  Service: Endoscopy;  Laterality: N/A;    reports that he has never smoked. He has never used smokeless tobacco. He reports that he drinks alcohol. He reports that he does not use drugs.  Functional Status Survey:    Family History  Problem Relation Age of Onset  . Varicose Veins Mother   . Stroke Mother   . Glaucoma Mother   . Kidney disease Mother   . Hypertension Father   . Heart attack Father   . Cancer Brother     testicular cancer  . Hyperlipidemia Sister   . Meniere's disease Sister     Health Maintenance  Topic Date Due  . OPHTHALMOLOGY EXAM  05/06/2016  . FOOT EXAM  08/12/2016  . HEMOGLOBIN A1C  03/15/2017  . TETANUS/TDAP  02/08/2023  . INFLUENZA VACCINE  Completed  . ZOSTAVAX  Completed  . PNA vac Low Risk Adult  Completed    No Known Allergies  Allergies as of 09/19/2016  No Known Allergies     Medication List       Accurate as of 09/19/16  9:26 AM. Always use your most recent med list.          ferrous sulfate 325 (65 FE) MG tablet Take 325 mg by mouth daily.   finasteride 5 MG tablet Commonly known as:  PROSCAR Take 5 mg by mouth daily.   glipiZIDE 5 MG 24 hr tablet Commonly known as:  GLIPIZIDE XL Take 1 tablet (5 mg total) by mouth daily with breakfast.   glucose blood test strip Commonly known as:  ONE TOUCH ULTRA TEST Check blood sugar once daily as directed for diabetes DX E11.8   lisinopril 5 MG tablet Commonly known as:  PRINIVIL,ZESTRIL Take 1 tablet (5 mg total) by mouth daily.   metFORMIN 1000 MG tablet Commonly known as:   GLUCOPHAGE TAKE 1 TABLET BY MOUTH 2 TIMES DAILY WITH A MEAL.   saxagliptin HCl 5 MG Tabs tablet Commonly known as:  ONGLYZA Take one tablet by mouth once daily with largest meal to control blood sugar   simvastatin 80 MG tablet Commonly known as:  ZOCOR TAKE 1 TABLET BY MOUTH EVERY EVENING FOR HYPERLIPIDEMIA   tamsulosin 0.4 MG Caps capsule Commonly known as:  FLOMAX       Review of Systems  Constitutional: Negative for chills, fever and malaise/fatigue.  Eyes: Negative for blurred vision.  Respiratory: Negative for shortness of breath.   Cardiovascular: Negative for chest pain, palpitations and leg swelling.  Skin: Negative for itching and rash.  Neurological: Negative for weakness.    Vitals:   09/19/16 0909  BP: (!) 150/70  Pulse: 71  Temp: 98.3 F (36.8 C)  TempSrc: Oral  SpO2: 98%  Weight: 199 lb (90.3 kg)  Height: 5\' 5"  (1.651 m)   Body mass index is 33.12 kg/m. Physical Exam  Constitutional: He is oriented to person, place, and time. He appears well-developed and well-nourished.  HENT:  Head: Normocephalic and atraumatic.  Right Ear: External ear normal.  Left Ear: External ear normal.  Nose: Nose normal.  Mouth/Throat: Oropharynx is clear and moist. No oropharyngeal exudate.  Missing most teeth (a few remain on the bottom); HOH, bilateral ears with cerumen impaction  Eyes: Conjunctivae and EOM are normal. Pupils are equal, round, and reactive to light.  Wearing glasses  Neck: Normal range of motion. Neck supple. No JVD present. No tracheal deviation present. No thyromegaly present.  Cardiovascular: Normal rate, regular rhythm, normal heart sounds and intact distal pulses.   Pulmonary/Chest: Effort normal and breath sounds normal. No respiratory distress.  Abdominal: Soft. Bowel sounds are normal. He exhibits no distension and no mass. There is no tenderness. There is no rebound and no guarding. A hernia is present.  Musculoskeletal: Normal range of  motion. He exhibits no edema, tenderness or deformity.  Lymphadenopathy:    He has no cervical adenopathy.  Neurological: He is alert and oriented to person, place, and time. No cranial nerve deficit.  Skin: Skin is warm and dry. Capillary refill takes less than 2 seconds.  Psychiatric:  Laughs frequently, jovial and pleasant    Labs reviewed: Basic Metabolic Panel:  Recent Labs  03/03/16 0001 06/03/16 0814 09/15/16 0822  NA 138 137 137  K 5.1 4.9 4.9  CL 103 103 103  CO2 25 22 23   GLUCOSE 92 97 92  BUN 33* 33* 33*  CREATININE 1.39* 1.38* 1.37*  CALCIUM 9.4 9.2 9.0   Liver Function  Tests:  Recent Labs  11/11/15 0845 03/03/16 0001  AST 24 24  ALT 22 24  ALKPHOS 71 60  BILITOT 0.5 0.7  PROT 6.8 7.2  ALBUMIN 4.3 4.3   No results for input(s): LIPASE, AMYLASE in the last 8760 hours. No results for input(s): AMMONIA in the last 8760 hours. CBC:  Recent Labs  03/03/16 0001 06/03/16 0814 09/15/16 0822  WBC 7.1 7.0 6.5  NEUTROABS 4,331 4,410 4,095  HGB 12.9* 13.9 13.0*  HCT 37.9* 41.1 40.1  MCV 86.1 87.1 88.1  PLT 170 176 164   Cardiac Enzymes: No results for input(s): CKTOTAL, CKMB, CKMBINDEX, TROPONINI in the last 8760 hours. BNP: Invalid input(s): POCBNP Lab Results  Component Value Date   HGBA1C 6.1 (H) 09/15/2016   No results found for: TSH Lab Results  Component Value Date   VITAMINB12 850 11/27/2015   Lab Results  Component Value Date   FOLATE 13.2 11/27/2015   Lab Results  Component Value Date   IRON 68 11/27/2015   TIBC 270 11/27/2015   FERRITIN 156 11/27/2015    Imaging and Procedures Recently: EKG:  NSR at 70bpm  Assessment/Plan 1. Annual physical exam -performed today   2. Essential hypertension, benign - bp elevated today even on recheck, has taken his meds (did have to see a new provider this am with LPN so maybe that's why?), will recheck in 2 weeks again - EKG 12-Lead normal  3. Type 2 diabetes mellitus with  complication, without long-term current use of insulin (HCC) -cont metformin, onglyza, , glipizide, lisinopril, zocor Lab Results  Component Value Date   HGBA1C 6.1 (H) 09/15/2016   4. Prostate nodule without urinary obstruction -cont flomax and finasteride -watchful waiting due to his comorbidities   5. Malignant neoplasm of colon, unspecified part of colon ([place]) -had prior partial resection, no recent known bleeding or melena -had cscope in August of 2016 with Dr. Marylouise Stacks was normal with 3 year f/u recommended  6. Pure hypercholesterolemia -last LDL right at goal of 70, cont statin therapy  7. Iron deficiency anemia due to chronic blood loss -cont daily iron therapy -monitor cbc--see above  Labs/tests ordered:   Orders Placed This Encounter  Procedures  . Ambulatory referral to Podiatry    Referral Priority:   Routine    Referral Type:   Consultation    Referral Reason:   Specialty Services Required    Requested Specialty:   Podiatry    Number of Visits Requested:   1  . EKG 12-Lead    Glada Wickstrom L. Lashane Whelpley, D.O. [facility] [address], [place] [address] Cell Phone (Mon-Fri 8am-5pm):  [phone] On Call:  [phone] & follow prompts after 5pm & weekends Office Phone:  [phone] Office Fax:  [phone]

## 2016-09-19 NOTE — Patient Instructions (Addendum)
Mr. Justin [name] , Thank you for taking time to come for your Medicare Wellness Visit. I appreciate your ongoing commitment to your health goals. Please review the following plan we discussed and let me know if I can assist you in the future.   These are the goals we discussed: Goals    . <enter goal here>          Starting 09/19/16, I will maintain my current lifestyle.        This is a list of the screening recommended for you and due dates:  Health Maintenance  Topic Date Due  . Eye exam for diabetics  05/06/2016  . Complete foot exam   08/12/2016  . Hemoglobin A1C  03/15/2017  . Tetanus Vaccine  02/08/2023  . Flu Shot  Completed  . Shingles Vaccine  Completed  . Pneumonia vaccines  Completed  Preventive Care for Adults  A healthy lifestyle and preventive care can promote health and wellness. Preventive health guidelines for adults include the following key practices.  . A routine yearly physical is a good way to check with your health care provider about your health and preventive screening. It is a chance to share any concerns and updates on your health and to receive a thorough exam.  . Visit your dentist for a routine exam and preventive care every 6 months. Brush your teeth twice a day and floss once a day. Good oral hygiene prevents tooth decay and gum disease.  . The frequency of eye exams is based on your age, health, family medical history, use  of contact lenses, and other factors. Follow your health care provider's ecommendations for frequency of eye exams.  . Eat a healthy diet. Foods like vegetables, fruits, whole grains, low-fat dairy products, and lean protein foods contain the nutrients you need without too many calories. Decrease your intake of foods high in solid fats, added sugars, and salt. Eat the right amount of calories for you. Get information about a proper diet from your health care provider, if necessary.  . Regular physical exercise is one of the most  important things you can do for your health. Most adults should get at least 150 minutes of moderate-intensity exercise (any activity that increases your heart rate and causes you to sweat) each week. In addition, most adults need muscle-strengthening exercises on 2 or more days a week.  Silver Sneakers may be a benefit available to you. To determine eligibility, you may visit the website: www.silversneakers.com or contact program at (564) 231-2472 Mon-Fri between 8AM-8PM.   . Maintain a healthy weight. The body mass index (BMI) is a screening tool to identify possible weight problems. It provides an estimate of body fat based on height and weight. Your health care provider can find your BMI and can help you achieve or maintain a healthy weight.   For adults 20 years and older: ? A BMI below 18.5 is considered underweight. ? A BMI of 18.5 to 24.9 is normal. ? A BMI of 25 to 29.9 is considered overweight. ? A BMI of 30 and above is considered obese.   . Maintain normal blood lipids and cholesterol levels by exercising and minimizing your intake of saturated fat. Eat a balanced diet with plenty of fruit and vegetables. Blood tests for lipids and cholesterol should begin at age 22 and be repeated every 5 years. If your lipid or cholesterol levels are high, you are over 50, or you are at high risk for heart disease, you may  need your cholesterol levels checked more frequently. Ongoing high lipid and cholesterol levels should be treated with medicines if diet and exercise are not working.  . If you smoke, find out from your health care provider how to quit. If you do not use tobacco, please do not start.  . If you choose to drink alcohol, please do not consume more than 2 drinks per day. One drink is considered to be 12 ounces (355 mL) of beer, 5 ounces (148 mL) of wine, or 1.5 ounces (44 mL) of liquor.  . If you are 33-12 years old, ask your health care provider if you should take aspirin to prevent  strokes.  . Use sunscreen. Apply sunscreen liberally and repeatedly throughout the day. You should seek shade when your shadow is shorter than you. Protect yourself by wearing long sleeves, pants, a wide-brimmed hat, and sunglasses year round, whenever you are outdoors.  . Once a month, do a whole body skin exam, using a mirror to look at the skin on your back. Tell your health care provider of new moles, moles that have irregular borders, moles that are larger than a pencil eraser, or moles that have changed in shape or color.

## 2016-09-19 NOTE — Progress Notes (Signed)
Subjective:   Justin [name] is a 76 y.o. male who presents for an Initial Medicare Annual Wellness Visit.  Review of Systems  Cardiac Risk Factors include: advanced age (>29men, >41 women);diabetes mellitus;dyslipidemia;hypertension;male gender;sedentary lifestyle;obesity (BMI >30kg/m2);family history of premature cardiovascular disease    Objective:    Today's Vitals   09/19/16 0829  BP: (!) 150/70  Pulse: 77  Temp: 98.3 F (36.8 C)  TempSrc: Oral  SpO2: 98%  Weight: 199 lb 3.2 oz (90.4 kg)  Height: 5\' 5"  (1.651 m)   Body mass index is 33.15 kg/m.  Current Medications (verified) Outpatient Encounter Prescriptions as of 09/19/2016  Medication Sig  . ferrous sulfate 325 (65 FE) MG tablet Take 325 mg by mouth daily.  . finasteride (PROSCAR) 5 MG tablet Take 5 mg by mouth daily.   Marland Kitchen glipiZIDE (GLIPIZIDE XL) 5 MG 24 hr tablet Take 1 tablet (5 mg total) by mouth daily with breakfast.  . glucose blood (ONE TOUCH ULTRA TEST) test strip Check blood sugar once daily as directed for diabetes DX E11.8  . lisinopril (PRINIVIL,ZESTRIL) 5 MG tablet Take 1 tablet (5 mg total) by mouth daily.  . metFORMIN (GLUCOPHAGE) 1000 MG tablet TAKE 1 TABLET BY MOUTH 2 TIMES DAILY WITH A MEAL.  . saxagliptin HCl (ONGLYZA) 5 MG TABS tablet Take one tablet by mouth once daily with largest meal to control blood sugar  . simvastatin (ZOCOR) 80 MG tablet TAKE 1 TABLET BY MOUTH EVERY EVENING FOR HYPERLIPIDEMIA  . tamsulosin (FLOMAX) 0.4 MG CAPS capsule    No facility-administered encounter medications on file as of 09/19/2016.     Allergies (verified) Patient has no known allergies.   History: Past Medical History:  Diagnosis Date  . Arthritis   . Chronic kidney disease    CKD- Stage  II  . Colon cancer ([facility])    2014  . Colon polyps    adenomatous  . Diabetes mellitus without complication ([facility])   . Eczema   . Hypercholesteremia   . Hypertension    Past Surgical History:  Procedure  Laterality Date  . COLON SURGERY  2014 & 2015   resection of colon & removal of poylp ; Dr Kathleene Hazel, Dr Sherlene Shams  . COLONOSCOPY  09/29/2013  . COLONOSCOPY WITH PROPOFOL N/A 03/10/2015   Procedure: COLONOSCOPY WITH PROPOFOL;  Surgeon: Inda Castle, MD;  [facility];  Service: Endoscopy;  Laterality: N/A;  . EXTERNAL EAR SURGERY  1954  . FLEXIBLE SIGMOIDOSCOPY N/A 07/21/2014   Procedure: FLEXIBLE SIGMOIDOSCOPY;  Surgeon: Inda Castle, MD;  [facility];  Service: Endoscopy;  Laterality: N/A;   Family History  Problem Relation Age of Onset  . Varicose Veins Mother   . Stroke Mother   . Glaucoma Mother   . Kidney disease Mother   . Hypertension Father   . Heart attack Father   . Cancer Brother     testicular cancer  . Hyperlipidemia Sister   . Meniere's disease Sister    Social History   Occupational History  . Not on file.   Social History Main Topics  . Smoking status: Never Smoker  . Smokeless tobacco: Never Used  . Alcohol use Yes     Comment: one beer a day  . Drug use: No  . Sexual activity: No   Tobacco Counseling Counseling given: No   Activities of Daily Living In your present state of health, do you have any difficulty performing the following activities: 09/19/2016  Hearing? Justin [name]  Vision? Y  Difficulty concentrating or making decisions? N  Walking or climbing stairs? N  Dressing or bathing? N  Doing errands, shopping? N  Preparing Food and eating ? N  Using the Toilet? N  In the past six months, have you accidently leaked urine? Y  Do you have problems with loss of bowel control? N  Managing your Medications? N  Managing your Finances? N  Housekeeping or managing your Housekeeping? N  Some recent data might be hidden    Immunizations and Health Maintenance Immunization History  Administered Date(s) Administered  . Influenza,inj,Quad PF,36+ Mos 05/14/2015, 06/03/2016  . Influenza-Unspecified 05/26/2011, 08/15/2012, 05/01/2013  .  Pneumococcal Conjugate-13 08/13/2015  . Pneumococcal Polysaccharide-23 03/12/2009  . Tdap 02/07/2013  . Zoster 01/03/2016   Health Maintenance Due  Topic Date Due  . OPHTHALMOLOGY EXAM  05/06/2016  . FOOT EXAM  08/12/2016    Patient Care Team: Gayland Curry, DO as PCP - General (Geriatric Medicine)  Indicate any recent Medical Services you may have received from other than [facility] providers in the past year (date may be approximate).    Assessment:   This is a routine wellness examination for Justin [name].   Hearing/Vision screen  Hearing Screening   125Hz  250Hz  500Hz  1000Hz  2000Hz  3000Hz  4000Hz  6000Hz  8000Hz   Right ear:   0 0 0  0    Left ear:   0 0 0  0    Comments: Last hearing screen was done many years ago. Pt denies any hearing loss at this time. Failed office hearing screen. Pt does not want to pursue further eval.   Vision Screening Comments: Last eye exam was done in 2017 with Dr. Gershon Crane.   Dietary issues and exercise activities discussed: Current Exercise Habits: Home exercise routine, Type of exercise: walking (exercise bike), Time (Minutes): 15, Frequency (Times/Week): 7, Weekly Exercise (Minutes/Week): 105, Intensity: Mild  Goals    . <enter goal here>          Starting 09/19/16, I will maintain my current lifestyle.       Depression Screen PHQ 2/9 Scores 09/19/2016 08/11/2016 01/05/2016 11/12/2015  PHQ - 2 Score 0 0 0 0    Fall Risk Fall Risk  09/19/2016 08/11/2016 01/05/2016 11/12/2015 09/24/2015  Falls in the past year? Yes No No No No  Number falls in past yr: 1 - - - -  Injury with Fall? No - - - -    Cognitive Function: MMSE - Mini Mental State Exam 09/19/2016 08/13/2015 05/30/2014  Orientation to time 5 5 5   Orientation to Place 5 4 4   Registration 3 3 3   Attention/ Calculation 1 5 3   Recall 3 3 2   Language- name 2 objects 2 2 2   Language- repeat 1 1 1   Language- follow 3 step command 3 3 3   Language- read & follow direction 1 1 1   Write a sentence 1 1 1     Copy design 0 0 0  Total score 25 28 25         Screening Tests Health Maintenance  Topic Date Due  . OPHTHALMOLOGY EXAM  05/06/2016  . FOOT EXAM  08/12/2016  . HEMOGLOBIN A1C  03/15/2017  . TETANUS/TDAP  02/08/2023  . INFLUENZA VACCINE  Completed  . ZOSTAVAX  Completed  . PNA vac Low Risk Adult  Completed        Plan:    I have personally reviewed and addressed the Medicare Annual Wellness questionnaire and have noted the following in the  patient's chart:  A. Medical and social history B. Use of alcohol, tobacco or illicit drugs  C. Current medications and supplements D. Functional ability and status E.  Nutritional status F.  Physical activity G. Advance directives H. List of other physicians I.  Hospitalizations, surgeries, and ER visits in previous 12 months J.  [place] to include hearing, vision, cognitive, depression L. Referrals and appointments - none  In addition, I have reviewed and discussed with patient certain preventive protocols, quality metrics, and best practice recommendations. A written personalized care plan for preventive services as well as general preventive health recommendations were provided to patient.  See attached scanned questionnaire for additional information.   Signed,   Allyn Kenner, LPN Health Advisor

## 2016-09-29 ENCOUNTER — Other Ambulatory Visit: Payer: Self-pay

## 2016-09-29 MED ORDER — SIMVASTATIN 80 MG PO TABS: ORAL_TABLET | ORAL | 1 refills | Status: DC

## 2016-10-03 ENCOUNTER — Ambulatory Visit (INDEPENDENT_AMBULATORY_CARE_PROVIDER_SITE_OTHER): Payer: Medicare Other | Admitting: Internal Medicine

## 2016-10-03 ENCOUNTER — Encounter: Payer: Self-pay | Admitting: Internal Medicine

## 2016-10-03 VITALS — BP 120/70 | HR 80 | Temp 98.0°F | Wt 197.0 lb

## 2016-10-03 DIAGNOSIS — E118 Type 2 diabetes mellitus with unspecified complications: Secondary | ICD-10-CM

## 2016-10-03 DIAGNOSIS — I10 Essential (primary) hypertension: Secondary | ICD-10-CM

## 2016-10-03 NOTE — Progress Notes (Signed)
Location:  [facility] Provider: Addyson Traub L. Mariea Clonts, D.O., C.M.D.  Code Status: DNR Goals of Care:  Advanced Directives 10/03/2016  Does Patient Have a Medical Advance Directive? Yes  Type of Advance Directive [place]  Does patient want to make changes to medical advance directive? -  Copy of [place] in Chart? Yes   Chief Complaint  Patient presents with  . Follow-up    2 week follow-up on blood pressure    HPI: Patient is a 76 y.o. male seen today for an acute visit for BP follow-up.   BP today 120/70. Was 150/70 on 2/19. Is taking lisinopril 5 mg daily. He took it today. Unsure why it was elevated last time, since he reports he took his medication, but nonetheless it is normal today. No other acute complaints, "I'll see you in 3 months".   His hba1c did trend up a bit since last visit. Lab Results  Component Value Date   HGBA1C 6.1 (H) 09/15/2016  counseled on watching his sweets and starchy carbs.  Past Medical History:  Diagnosis Date  . Arthritis   . Chronic kidney disease    CKD- Stage  II  . Colon cancer ([place])    2014  . Colon polyps    adenomatous  . Diabetes mellitus without complication ([place])   . Eczema   . Hypercholesteremia   . Hypertension     Past Surgical History:  Procedure Laterality Date  . COLON SURGERY  2014 & 2015   resection of colon & removal of poylp ; Dr Kathleene Hazel, Dr Sherlene Shams  . COLONOSCOPY  09/29/2013  . COLONOSCOPY WITH PROPOFOL N/A 03/10/2015   Procedure: COLONOSCOPY WITH PROPOFOL;  Surgeon: Inda Castle, MD;  Location: [facility];  Service: Endoscopy;  Laterality: N/A;  . EXTERNAL EAR SURGERY  1954  . FLEXIBLE SIGMOIDOSCOPY N/A 07/21/2014   Procedure: FLEXIBLE SIGMOIDOSCOPY;  Surgeon: Inda Castle, MD;  [facility];  Service: Endoscopy;  Laterality: N/A;    No Known Allergies  Allergies as of 10/03/2016   No Known Allergies     Medication List       Accurate as of 10/03/16  10:38 AM. Always use your most recent med list.          ferrous sulfate 325 (65 FE) MG tablet Take 325 mg by mouth daily.   finasteride 5 MG tablet Commonly known as:  PROSCAR Take 1 tablet (5 mg total) by mouth daily.   glipiZIDE 5 MG 24 hr tablet Commonly known as:  GLIPIZIDE XL Take 1 tablet (5 mg total) by mouth daily with breakfast.   glucose blood test strip Commonly known as:  ONE TOUCH ULTRA TEST Check blood sugar once daily as directed for diabetes DX E11.8   lisinopril 5 MG tablet Commonly known as:  PRINIVIL,ZESTRIL Take 1 tablet (5 mg total) by mouth daily.   metFORMIN 1000 MG tablet Commonly known as:  GLUCOPHAGE TAKE 1 TABLET BY MOUTH 2 TIMES DAILY WITH A MEAL.   saxagliptin HCl 5 MG Tabs tablet Commonly known as:  ONGLYZA Take one tablet by mouth once daily with largest meal to control blood sugar   simvastatin 80 MG tablet Commonly known as:  ZOCOR TAKE 1 TABLET BY MOUTH EVERY EVENING FOR HYPERLIPIDEMIA   tamsulosin 0.4 MG Caps capsule Commonly known as:  FLOMAX       Review of Systems:  Review of Systems  Constitutional: Negative for malaise/fatigue.  HENT: Positive for  hearing loss.   Eyes:       No visual changes  Respiratory: Negative for shortness of breath.   Cardiovascular: Negative for chest pain and palpitations.  Neurological: Negative for dizziness, weakness and headaches.  Endo/Heme/Allergies:       Diabetes    Health Maintenance  Topic Date Due  . OPHTHALMOLOGY EXAM  05/06/2016  . HEMOGLOBIN A1C  03/15/2017  . FOOT EXAM  09/19/2017  . TETANUS/TDAP  02/08/2023  . INFLUENZA VACCINE  Completed  . PNA vac Low Risk Adult  Completed    Physical Exam: Vitals:   10/03/16 1028  BP: 120/70  Pulse: 80  Temp: 98 F (36.7 C)  TempSrc: Oral  SpO2: 96%  Weight: 197 lb (89.4 kg)   Body mass index is 32.78 kg/m. Physical Exam  Constitutional: He is oriented to person, place, and time. He appears well-developed and  well-nourished. No distress.  Eyes:  Dark sunglasses  Cardiovascular: Normal rate, regular rhythm, normal heart sounds and intact distal pulses.   Pulmonary/Chest: Effort normal and breath sounds normal. No respiratory distress.  Musculoskeletal: Normal range of motion.  Neurological: He is alert and oriented to person, place, and time.   Labs reviewed: Basic Metabolic Panel:  Recent Labs  03/03/16 0001 06/03/16 0814 09/15/16 0822  NA 138 137 137  K 5.1 4.9 4.9  CL 103 103 103  CO2 25 22 23   GLUCOSE 92 97 92  BUN 33* 33* 33*  CREATININE 1.39* 1.38* 1.37*  CALCIUM 9.4 9.2 9.0   Liver Function Tests:  Recent Labs  11/11/15 0845 03/03/16 0001  AST 24 24  ALT 22 24  ALKPHOS 71 60  BILITOT 0.5 0.7  PROT 6.8 7.2  ALBUMIN 4.3 4.3   No results for input(s): LIPASE, AMYLASE in the last 8760 hours. No results for input(s): AMMONIA in the last 8760 hours. CBC:  Recent Labs  03/03/16 0001 06/03/16 0814 09/15/16 0822  WBC 7.1 7.0 6.5  NEUTROABS 4,331 4,410 4,095  HGB 12.9* 13.9 13.0*  HCT 37.9* 41.1 40.1  MCV 86.1 87.1 88.1  PLT 170 176 164   Lipid Panel:  Recent Labs  11/11/15 0845 09/15/16 0822  CHOL 124 141  HDL 39* 42  LDLCALC 60 70  TRIG 126 147  CHOLHDL 3.2 3.4   Lab Results  Component Value Date   HGBA1C 6.1 (H) 09/15/2016   Assessment/Plan 1. Essential hypertension, benign - bp at goal today, cont lisinopril - Basic metabolic panel; Future  2. Type 2 diabetes mellitus with complication, without long-term current use of insulin (HCC) - sugar avg up slightly--discussed dietary causes, cont onglyza, metformin, glipizide, ace, not on statin - Hemoglobin A1c; Future - Basic metabolic panel; Future - Lipid panel; Future  Labs/tests ordered:  Orders Placed This Encounter  Procedures  . Hemoglobin A1c    Standing Status:   Future    Standing Expiration Date:   04/05/2017  . Basic metabolic panel    Standing Status:   Future    Standing  Expiration Date:   04/05/2017    Order Specific Question:   Has the patient fasted?    Answer:   Yes  . Lipid panel    Standing Status:   Future    Standing Expiration Date:   04/05/2017    Order Specific Question:   Has the patient fasted?    Answer:   Yes    Next appt:  3 mos med mgt, labs before  [place] Alexyia Guarino, D.O.  [facility] [address], [place] [address] Cell Phone (Mon-Fri 8am-5pm):  [phone] On Call:  [phone] & follow prompts after 5pm & weekends Office Phone:  [phone] Office Fax:  [phone]

## 2016-10-13 ENCOUNTER — Encounter: Payer: Self-pay | Admitting: Internal Medicine

## 2016-10-24 ENCOUNTER — Other Ambulatory Visit: Payer: Self-pay | Admitting: *Deleted

## 2016-10-24 MED ORDER — SAXAGLIPTIN HCL 5 MG PO TABS: ORAL_TABLET | ORAL | 3 refills | Status: DC

## 2016-10-24 NOTE — Telephone Encounter (Signed)
[employer] [facility] 

## 2016-10-26 ENCOUNTER — Ambulatory Visit: Payer: Medicare Other | Admitting: Podiatry

## 2016-11-17 DIAGNOSIS — H5213 Myopia, bilateral: Secondary | ICD-10-CM | POA: Diagnosis not present

## 2016-11-17 DIAGNOSIS — H524 Presbyopia: Secondary | ICD-10-CM | POA: Diagnosis not present

## 2016-11-17 DIAGNOSIS — E109 Type 1 diabetes mellitus without complications: Secondary | ICD-10-CM | POA: Diagnosis not present

## 2016-11-17 DIAGNOSIS — H2513 Age-related nuclear cataract, bilateral: Secondary | ICD-10-CM | POA: Diagnosis not present

## 2016-11-17 LAB — HM DIABETES EYE EXAM

## 2016-11-24 ENCOUNTER — Ambulatory Visit (INDEPENDENT_AMBULATORY_CARE_PROVIDER_SITE_OTHER): Payer: Medicare Other | Admitting: Podiatry

## 2016-11-24 ENCOUNTER — Encounter: Payer: Self-pay | Admitting: Podiatry

## 2016-11-24 VITALS — BP 158/89 | HR 54

## 2016-11-24 DIAGNOSIS — B351 Tinea unguium: Secondary | ICD-10-CM | POA: Diagnosis not present

## 2016-11-24 DIAGNOSIS — M79676 Pain in unspecified toe(s): Secondary | ICD-10-CM | POA: Diagnosis not present

## 2016-11-24 NOTE — Progress Notes (Signed)
   Subjective:    Patient ID: Justin [name], male    DOB: [birth year], 76 y.o.   MRN: [id number]  HPI    Review of Systems  All other systems reviewed and are negative.      Objective:   Physical Exam        Assessment & Plan:

## 2016-11-24 NOTE — Progress Notes (Signed)
   Subjective:    Patient ID: Justin [name], male    DOB: 05/01/1941, 76 y.o.   MRN: [id number]  HPI    Review of Systems  All other systems reviewed and are negative.      Objective:   Physical Exam        Assessment & Plan:

## 2016-11-24 NOTE — Progress Notes (Signed)
   Subjective:    Patient ID: Justin [name], male    DOB: 05-03-1941, 76 y.o.   MRN: [id number]  HPI this patient presents the office with chief complaint of long thick nails. He says he has trim the nails but they are still long and he requests that the nails be trimmed.  He says he has difficulty reaching his nails and cutting his nails. This patient is diabetic. He presents the office today for an evaluation and treatment of this condition    Review of Systems  All other systems reviewed and are negative.      Objective:   Physical Exam GENERAL APPEARANCE: Alert, conversant. Appropriately groomed. No acute distress.  VASCULAR: Pedal pulses are  palpable at  [facility] and PT bilateral.  Capillary refill time is immediate to all digits,  Normal temperature gradient.  Digital hair growth is present bilateral  NEUROLOGIC: sensation is normal to 5.07 monofilament at 5/5 sites bilateral.  Light touch is intact bilateral, Muscle strength normal.  MUSCULOSKELETAL: acceptable muscle strength, tone and stability bilateral.  HAV  B/L  DERMATOLOGIC: skin color, texture, and turgor are within normal limits.  No preulcerative lesions or ulcers  are seen, no interdigital maceration noted.  No open lesions present.  No drainage noted.  NAIL:S  Thick disfigured discolored nails both feet.         Assessment & Plan:  Onychomycosis  B/L  Diabetes  IE  Debride nails  RTC prn   Gardiner Barefoot DPM

## 2016-12-28 ENCOUNTER — Other Ambulatory Visit: Payer: Self-pay | Admitting: *Deleted

## 2016-12-28 MED ORDER — GLIPIZIDE ER 5 MG PO TB24: 5.0000 mg | ORAL_TABLET | Freq: Every day | ORAL | 1 refills | Status: DC

## 2016-12-28 NOTE — Telephone Encounter (Signed)
[employer] [facility] 

## 2017-01-03 ENCOUNTER — Other Ambulatory Visit: Payer: Medicare Other

## 2017-01-03 DIAGNOSIS — I10 Essential (primary) hypertension: Secondary | ICD-10-CM | POA: Diagnosis not present

## 2017-01-03 DIAGNOSIS — E118 Type 2 diabetes mellitus with unspecified complications: Secondary | ICD-10-CM | POA: Diagnosis not present

## 2017-01-04 LAB — BASIC METABOLIC PANEL
BUN: 32 mg/dL — ABNORMAL HIGH (ref 7–25)
CO2: 22 mmol/L (ref 20–31)
Calcium: 8.9 mg/dL (ref 8.6–10.3)
Chloride: 105 mmol/L (ref 98–110)
Creat: 1.31 mg/dL — ABNORMAL HIGH (ref 0.70–1.18)
Glucose, Bld: 98 mg/dL (ref 65–99)
Potassium: 5.3 mmol/L (ref 3.5–5.3)
Sodium: 138 mmol/L (ref 135–146)

## 2017-01-04 LAB — HEMOGLOBIN A1C
Hgb A1c MFr Bld: 6.1 % — ABNORMAL HIGH (ref <5.7)
Mean Plasma Glucose: 128 mg/dL

## 2017-01-04 LAB — LIPID PANEL
Cholesterol: 140 mg/dL (ref <200)
HDL: 40 mg/dL — ABNORMAL LOW (ref >40)
LDL Cholesterol: 50 mg/dL (ref <100)
Total CHOL/HDL Ratio: 3.5 Ratio (ref <5.0)
Triglycerides: 250 mg/dL — ABNORMAL HIGH (ref <150)
VLDL: 50 mg/dL — ABNORMAL HIGH (ref <30)

## 2017-01-05 ENCOUNTER — Encounter: Payer: Self-pay | Admitting: *Deleted

## 2017-01-05 ENCOUNTER — Encounter: Payer: Self-pay | Admitting: Internal Medicine

## 2017-01-05 ENCOUNTER — Ambulatory Visit (INDEPENDENT_AMBULATORY_CARE_PROVIDER_SITE_OTHER): Payer: Medicare Other | Admitting: Internal Medicine

## 2017-01-05 VITALS — BP 140/70 | HR 83 | Temp 97.0°F | Wt 198.0 lb

## 2017-01-05 DIAGNOSIS — N182 Chronic kidney disease, stage 2 (mild): Secondary | ICD-10-CM

## 2017-01-05 DIAGNOSIS — H6123 Impacted cerumen, bilateral: Secondary | ICD-10-CM

## 2017-01-05 DIAGNOSIS — I10 Essential (primary) hypertension: Secondary | ICD-10-CM | POA: Diagnosis not present

## 2017-01-05 DIAGNOSIS — D500 Iron deficiency anemia secondary to blood loss (chronic): Secondary | ICD-10-CM

## 2017-01-05 DIAGNOSIS — N402 Nodular prostate without lower urinary tract symptoms: Secondary | ICD-10-CM

## 2017-01-05 DIAGNOSIS — Z85038 Personal history of other malignant neoplasm of large intestine: Secondary | ICD-10-CM

## 2017-01-05 DIAGNOSIS — E1122 Type 2 diabetes mellitus with diabetic chronic kidney disease: Secondary | ICD-10-CM | POA: Diagnosis not present

## 2017-01-05 DIAGNOSIS — E782 Mixed hyperlipidemia: Secondary | ICD-10-CM | POA: Diagnosis not present

## 2017-01-05 MED ORDER — LISINOPRIL 10 MG PO TABS: 5.0000 mg | ORAL_TABLET | Freq: Every day | ORAL | 5 refills | Status: DC

## 2017-01-05 NOTE — Progress Notes (Signed)
Location:  [facility] Provider:  Jamarrius Salay L. Mariea Clonts, D.O., C.M.D.  Code Status: DNR Goals of Care:  Advanced Directives 01/05/2017  Does Patient Have a Medical Advance Directive? Yes  Type of Advance Directive [place]  Does patient want to make changes to medical advance directive? -  Copy of [place] in Chart? Yes   Chief Complaint  Patient presents with  . Medical Management of Chronic Issues    HPI: Patient is a 76 y.o. male seen today for medical management of chronic diseases.    BP elevated again today.  No pain.  No salty food for breakfast. Reviewing last several, most are above goal.  He's on lisinopril 5mg  daily.  TG high on bloodwork.  Eating too much pasta.  Discussed cutting down on portions.  Drinks one beer per day.  On zocor 80mg  (max).  Ears are full of wax.    DMII:  hba1c good with his metformin, glipizide, onglyza.  No lows.  Eats well.  Walks some for exercise.    BPH:  On flomax.  One time per night only nocturia and able to go back to sleep w/o a problem.  Past Medical History:  Diagnosis Date  . Arthritis   . Chronic kidney disease    CKD- Stage  II  . Colon cancer ([place])    2014  . Colon polyps    adenomatous  . Diabetes mellitus without complication ([place])   . Eczema   . Hypercholesteremia   . Hypertension     Past Surgical History:  Procedure Laterality Date  . COLON SURGERY  2014 & 2015   resection of colon & removal of poylp ; Dr Kathleene Hazel, Dr Sherlene Shams  . COLONOSCOPY  09/29/2013  . COLONOSCOPY WITH PROPOFOL N/A 03/10/2015   Procedure: COLONOSCOPY WITH PROPOFOL;  Surgeon: Inda Castle, MD;  [facility];  Service: Endoscopy;  Laterality: N/A;  . EXTERNAL EAR SURGERY  1954  . FLEXIBLE SIGMOIDOSCOPY N/A 07/21/2014   Procedure: FLEXIBLE SIGMOIDOSCOPY;  Surgeon: Inda Castle, MD;  [facility];  Service: Endoscopy;  Laterality: N/A;    No Known Allergies  Allergies as of  01/05/2017   No Known Allergies     Medication List       Accurate as of 01/05/17 10:12 AM. Always use your most recent med list.          ferrous sulfate 325 (65 FE) MG tablet Take 325 mg by mouth daily.   finasteride 5 MG tablet Commonly known as:  PROSCAR Take 1 tablet (5 mg total) by mouth daily.   glipiZIDE 5 MG 24 hr tablet Commonly known as:  GLIPIZIDE XL Take 1 tablet (5 mg total) by mouth daily with breakfast.   glucose blood test strip Commonly known as:  ONE TOUCH ULTRA TEST Check blood sugar once daily as directed for diabetes DX E11.8   lisinopril 5 MG tablet Commonly known as:  PRINIVIL,ZESTRIL Take 1 tablet (5 mg total) by mouth daily.   metFORMIN 1000 MG tablet Commonly known as:  GLUCOPHAGE TAKE 1 TABLET BY MOUTH 2 TIMES DAILY WITH A MEAL.   saxagliptin HCl 5 MG Tabs tablet Commonly known as:  ONGLYZA Take one tablet by mouth once daily with largest meal to control blood sugar   simvastatin 80 MG tablet Commonly known as:  ZOCOR TAKE 1 TABLET BY MOUTH EVERY EVENING FOR HYPERLIPIDEMIA   tamsulosin 0.4 MG Caps capsule Commonly known as:  FLOMAX  Review of Systems:  Review of Systems  Constitutional: Negative for chills, fever and malaise/fatigue.  HENT: Positive for hearing loss.        Cerumen impaction  Eyes: Negative for blurred vision.  Respiratory: Negative for cough and shortness of breath.   Cardiovascular: Negative for chest pain, palpitations and leg swelling.  Gastrointestinal: Negative for abdominal pain, blood in stool, constipation, diarrhea and melena.  Genitourinary: Negative for dysuria and urgency.       1x nocturia  Musculoskeletal: Negative for back pain and falls.  Skin: Negative for itching and rash.  Neurological: Negative for dizziness, loss of consciousness and weakness.  Endo/Heme/Allergies: Does not bruise/bleed easily.  Psychiatric/Behavioral: Negative for depression and memory loss.    Health Maintenance    Topic Date Due  . INFLUENZA VACCINE  03/01/2017  . HEMOGLOBIN A1C  07/05/2017  . FOOT EXAM  09/19/2017  . OPHTHALMOLOGY EXAM  11/17/2017  . TETANUS/TDAP  02/08/2023  . PNA vac Low Risk Adult  Completed    Physical Exam: Vitals:   01/05/17 1001  BP: (!) 160/80  Pulse: 83  Temp: 97 F (36.1 C)  TempSrc: Oral  SpO2: 98%  Weight: 198 lb (89.8 kg)   Body mass index is 32.95 kg/m. Physical Exam  Constitutional: He is oriented to person, place, and time. He appears well-developed and well-nourished. No distress.  Cardiovascular: Normal rate, regular rhythm, normal heart sounds and intact distal pulses.   Pulmonary/Chest: Effort normal and breath sounds normal.  Abdominal: Soft. Bowel sounds are normal. He exhibits no mass. There is no tenderness. There is no rebound and no guarding. No hernia.  hernia  Musculoskeletal: Normal range of motion.  Neurological: He is alert and oriented to person, place, and time.  Skin: Skin is warm and dry. Capillary refill takes less than 2 seconds.  Psychiatric: He has a normal mood and affect.    Labs reviewed: Basic Metabolic Panel:  Recent Labs  06/03/16 0814 09/15/16 0822 01/03/17 0808  NA 137 137 138  K 4.9 4.9 5.3  CL 103 103 105  CO2 22 23 22   GLUCOSE 97 92 98  BUN 33* 33* 32*  CREATININE 1.38* 1.37* 1.31*  CALCIUM 9.2 9.0 8.9   Liver Function Tests:  Recent Labs  03/03/16 0001  AST 24  ALT 24  ALKPHOS 60  BILITOT 0.7  PROT 7.2  ALBUMIN 4.3   No results for input(s): LIPASE, AMYLASE in the last 8760 hours. No results for input(s): AMMONIA in the last 8760 hours. CBC:  Recent Labs  03/03/16 0001 06/03/16 0814 09/15/16 0822  WBC 7.1 7.0 6.5  NEUTROABS 4,331 4,410 4,095  HGB 12.9* 13.9 13.0*  HCT 37.9* 41.1 40.1  MCV 86.1 87.1 88.1  PLT 170 176 164   Lipid Panel:  Recent Labs  09/15/16 0822 01/03/17 0808  CHOL 141 140  HDL 42 40*  LDLCALC 70 50  TRIG 147 250*  CHOLHDL 3.4 3.5   Lab Results   Component Value Date   HGBA1C 6.1 (H) 01/03/2017   Assessment/Plan 1. Type 2 diabetes mellitus with stage 2 chronic kidney disease, without long-term current use of insulin (HCC) -well controlled, cont current regimen as above -can't take asa due to chronic anemia, polyps and prior colon cancer that was resected  2. Essential hypertension, benign -uncontrolled, increase lisinopril to 10mg  daily and monitor  3. Prostate nodule without urinary obstruction -ongoing with nocturia x 1, follows with urology, cont flomax  4. Iron deficiency anemia due  to chronic blood loss -f/u cbc next visit, monitor -cont iron  5. Mixed hyperlipidemia -LDL good, but TG too high--discussed decreasing portion sizes of starchy foods like spaghetti  6. Personal history of colon cancer, stage II -is now uptodate on cscope  7. Bilateral hearing loss due to cerumen impaction -ear lavage performed today with warm water and peroxide solution   Labs/tests ordered:  Orders Placed This Encounter  Procedures  . CBC with Differential/Platelet    Standing Status:   Future    Standing Expiration Date:   07/07/2017  . COMPLETE METABOLIC PANEL WITH GFR    Standing Status:   Future    Standing Expiration Date:   07/07/2017  . Hemoglobin A1c    Standing Status:   Future    Standing Expiration Date:   07/07/2017  . Lipid panel    Standing Status:   Future    Standing Expiration Date:   07/07/2017    Next appt:  3 mos med mgt labs before  Asier Desroches L. Harmani Neto, D.O. [facility] [address], [place] [address] Cell Phone (Mon-Fri 8am-5pm):  [phone] On Call:  [phone] & follow prompts after 5pm & weekends Office Phone:  [phone] Office Fax:  [phone]

## 2017-03-20 DIAGNOSIS — R972 Elevated prostate specific antigen [PSA]: Secondary | ICD-10-CM | POA: Diagnosis not present

## 2017-03-27 DIAGNOSIS — N401 Enlarged prostate with lower urinary tract symptoms: Secondary | ICD-10-CM | POA: Diagnosis not present

## 2017-03-27 DIAGNOSIS — R972 Elevated prostate specific antigen [PSA]: Secondary | ICD-10-CM | POA: Diagnosis not present

## 2017-04-08 ENCOUNTER — Other Ambulatory Visit: Payer: Self-pay | Admitting: Internal Medicine

## 2017-04-24 ENCOUNTER — Other Ambulatory Visit: Payer: Self-pay | Admitting: Internal Medicine

## 2017-04-24 ENCOUNTER — Other Ambulatory Visit: Payer: Medicare Other

## 2017-04-24 DIAGNOSIS — E1122 Type 2 diabetes mellitus with diabetic chronic kidney disease: Secondary | ICD-10-CM

## 2017-04-24 DIAGNOSIS — I10 Essential (primary) hypertension: Secondary | ICD-10-CM | POA: Diagnosis not present

## 2017-04-24 DIAGNOSIS — D500 Iron deficiency anemia secondary to blood loss (chronic): Secondary | ICD-10-CM

## 2017-04-24 DIAGNOSIS — N182 Chronic kidney disease, stage 2 (mild): Secondary | ICD-10-CM | POA: Diagnosis not present

## 2017-04-24 DIAGNOSIS — E782 Mixed hyperlipidemia: Secondary | ICD-10-CM

## 2017-04-24 DIAGNOSIS — Z85038 Personal history of other malignant neoplasm of large intestine: Secondary | ICD-10-CM

## 2017-04-25 LAB — CBC WITH DIFFERENTIAL/PLATELET
Basophils Absolute: 37 cells/uL (ref 0–200)
Basophils Relative: 0.5 %
Eosinophils Absolute: 163 cells/uL (ref 15–500)
Eosinophils Relative: 2.2 %
HCT: 39.9 % (ref 38.5–50.0)
Hemoglobin: 13.4 g/dL (ref 13.2–17.1)
Lymphs Abs: 1576 cells/uL (ref 850–3900)
MCH: 29.1 pg (ref 27.0–33.0)
MCHC: 33.6 g/dL (ref 32.0–36.0)
MCV: 86.7 fL (ref 80.0–100.0)
MPV: 10.3 fL (ref 7.5–12.5)
Monocytes Relative: 9.6 %
Neutro Abs: 4914 cells/uL (ref 1500–7800)
Neutrophils Relative %: 66.4 %
Platelets: 172 10*3/uL (ref 140–400)
RBC: 4.6 10*6/uL (ref 4.20–5.80)
RDW: 13 % (ref 11.0–15.0)
Total Lymphocyte: 21.3 %
WBC mixed population: 710 cells/uL (ref 200–950)
WBC: 7.4 10*3/uL (ref 3.8–10.8)

## 2017-04-25 LAB — LIPID PANEL
Cholesterol: 149 mg/dL (ref <200)
HDL: 43 mg/dL (ref >40)
LDL Cholesterol (Calc): 78 mg/dL (calc)
Non-HDL Cholesterol (Calc): 106 mg/dL (calc) (ref <130)
Total CHOL/HDL Ratio: 3.5 (calc) (ref <5.0)
Triglycerides: 182 mg/dL — ABNORMAL HIGH (ref <150)

## 2017-04-25 LAB — COMPLETE METABOLIC PANEL WITH GFR
AG Ratio: 1.5 (calc) (ref 1.0–2.5)
ALT: 21 U/L (ref 9–46)
AST: 21 U/L (ref 10–35)
Albumin: 4.3 g/dL (ref 3.6–5.1)
Alkaline phosphatase (APISO): 63 U/L (ref 40–115)
BUN/Creatinine Ratio: 22 (calc) (ref 6–22)
BUN: 29 mg/dL — ABNORMAL HIGH (ref 7–25)
CO2: 27 mmol/L (ref 20–32)
Calcium: 9.1 mg/dL (ref 8.6–10.3)
Chloride: 103 mmol/L (ref 98–110)
Creat: 1.29 mg/dL — ABNORMAL HIGH (ref 0.70–1.18)
GFR, Est African American: 62 mL/min/{1.73_m2} (ref >=60)
GFR, Est Non African American: 53 mL/min/{1.73_m2} — ABNORMAL LOW (ref >=60)
Globulin: 2.8 g/dL (calc) (ref 1.9–3.7)
Glucose, Bld: 103 mg/dL — ABNORMAL HIGH (ref 65–99)
Potassium: 5 mmol/L (ref 3.5–5.3)
Sodium: 137 mmol/L (ref 135–146)
Total Bilirubin: 0.7 mg/dL (ref 0.2–1.2)
Total Protein: 7.1 g/dL (ref 6.1–8.1)

## 2017-04-25 LAB — HEMOGLOBIN A1C
Hgb A1c MFr Bld: 6.1 % of total Hgb — ABNORMAL HIGH (ref <5.7)
Mean Plasma Glucose: 128 (calc)
eAG (mmol/L): 7.1 (calc)

## 2017-04-27 ENCOUNTER — Ambulatory Visit (INDEPENDENT_AMBULATORY_CARE_PROVIDER_SITE_OTHER): Payer: Medicare Other | Admitting: Internal Medicine

## 2017-04-27 ENCOUNTER — Encounter: Payer: Self-pay | Admitting: Internal Medicine

## 2017-04-27 VITALS — BP 128/70 | HR 77 | Temp 98.2°F | Wt 202.0 lb

## 2017-04-27 DIAGNOSIS — D500 Iron deficiency anemia secondary to blood loss (chronic): Secondary | ICD-10-CM | POA: Diagnosis not present

## 2017-04-27 DIAGNOSIS — E782 Mixed hyperlipidemia: Secondary | ICD-10-CM

## 2017-04-27 DIAGNOSIS — I10 Essential (primary) hypertension: Secondary | ICD-10-CM | POA: Diagnosis not present

## 2017-04-27 DIAGNOSIS — H6123 Impacted cerumen, bilateral: Secondary | ICD-10-CM

## 2017-04-27 DIAGNOSIS — Z23 Encounter for immunization: Secondary | ICD-10-CM

## 2017-04-27 DIAGNOSIS — E118 Type 2 diabetes mellitus with unspecified complications: Secondary | ICD-10-CM

## 2017-04-27 DIAGNOSIS — N402 Nodular prostate without lower urinary tract symptoms: Secondary | ICD-10-CM

## 2017-04-27 NOTE — Progress Notes (Signed)
Location:  [facility] clinic Provider:  Farhaan Mabee L. Mariea Clonts, D.O., C.M.D.  Code Status: DNR  Goals of Care:  Advanced Directives 01/05/2017  Does Patient Have a Medical Advance Directive? Yes  Type of Advance Directive [place]  Does patient want to make changes to medical advance directive? -  Copy of [place] in Chart? Yes   Chief Complaint  Patient presents with  . Medical Management of Chronic Issues    33mth follow-up, discuss labs    HPI: Patient is a 76 y.o. male seen today for medical management of chronic diseases.    DMII:  Cont glipizide, metformin, onglyza therapy.   hba1c stable at 6.1.  Says the onglyza is expensive b/c he's in the donut hole.    Hyperlipidemia:  LDL 78 improved.  On high dose zocor.  Anemia:  Iron deficiency--hgb normal at 13.4.  On iron supplement.  Dropped when stopped before.    Bilateral hearing loss with cerumen:  Left ear blocked today.  Saw prostate doctor and continues with watchful waiting.  Continues on proscar and flomax.    Past Medical History:  Diagnosis Date  . Arthritis   . Chronic kidney disease    CKD- Stage  II  . Colon cancer ([place])    2014  . Colon polyps    adenomatous  . Diabetes mellitus without complication ([place])   . Eczema   . Hypercholesteremia   . Hypertension     Past Surgical History:  Procedure Laterality Date  . COLON SURGERY  2014 & 2015   resection of colon & removal of poylp ; Dr Kathleene Hazel, Dr Sherlene Shams  . COLONOSCOPY  09/29/2013  . COLONOSCOPY WITH PROPOFOL N/A 03/10/2015   Procedure: COLONOSCOPY WITH PROPOFOL;  Surgeon: Inda Castle, MD;  [facility];  Service: Endoscopy;  Laterality: N/A;  . EXTERNAL EAR SURGERY  1954  . FLEXIBLE SIGMOIDOSCOPY N/A 07/21/2014   Procedure: FLEXIBLE SIGMOIDOSCOPY;  Surgeon: Inda Castle, MD;  [facility];  Service: Endoscopy;  Laterality: N/A;    No Known Allergies  Outpatient Encounter Prescriptions as of  04/27/2017  Medication Sig  . ferrous sulfate 325 (65 FE) MG tablet Take 325 mg by mouth daily.  . finasteride (PROSCAR) 5 MG tablet Take 1 tablet (5 mg total) by mouth daily.  Marland Kitchen glipiZIDE (GLIPIZIDE XL) 5 MG 24 hr tablet Take 1 tablet (5 mg total) by mouth daily with breakfast.  . glucose blood (ONE TOUCH ULTRA TEST) test strip Check blood sugar once daily as directed for diabetes DX E11.8  . lisinopril (PRINIVIL,ZESTRIL) 10 MG tablet Take 0.5 tablets (5 mg total) by mouth daily.  . metFORMIN (GLUCOPHAGE) 1000 MG tablet TAKE 1 TABLET BY MOUTH 2 TIMES DAILY WITH A MEAL.  . saxagliptin HCl (ONGLYZA) 5 MG TABS tablet Take one tablet by mouth once daily with largest meal to control blood sugar  . simvastatin (ZOCOR) 80 MG tablet TAKE ONE TABLET BY MOUTH EVERY EVENING FOR HYPERLIPIDEMIA  . tamsulosin (FLOMAX) 0.4 MG CAPS capsule    No facility-administered encounter medications on file as of 04/27/2017.     Review of Systems:  Review of Systems  Constitutional: Negative for chills, fever and malaise/fatigue.  HENT: Positive for hearing loss.        Left ear feels full  Eyes: Negative for blurred vision.  Respiratory: Negative for shortness of breath.   Cardiovascular: Negative for chest pain and palpitations.  Gastrointestinal: Negative for abdominal pain, blood  in stool, constipation, diarrhea and melena.  Genitourinary: Positive for frequency. Negative for dysuria.  Musculoskeletal: Negative for back pain, falls and joint pain.  Neurological: Negative for dizziness and weakness.  Psychiatric/Behavioral: Negative for depression and memory loss. The patient is not nervous/anxious.     Health Maintenance  Topic Date Due  . INFLUENZA VACCINE  03/01/2017  . FOOT EXAM  09/19/2017  . HEMOGLOBIN A1C  10/22/2017  . OPHTHALMOLOGY EXAM  11/17/2017  . TETANUS/TDAP  02/08/2023  . PNA vac Low Risk Adult  Completed    Physical Exam: Vitals:   04/27/17 1519  BP: 128/70  Pulse: 77  Temp: 98.2  F (36.8 C)  TempSrc: Oral  SpO2: 95%  Weight: 202 lb (91.6 kg)   Body mass index is 33.61 kg/m. Physical Exam  Constitutional: He is oriented to person, place, and time. He appears well-developed and well-nourished. No distress.  HENT:  Left ear with cerumen impaction, right ear small amt cerumen but pink tm with normal reflex visible  Cardiovascular: Normal rate, regular rhythm, normal heart sounds and intact distal pulses.   Pulmonary/Chest: Effort normal and breath sounds normal. No respiratory distress.  Abdominal: Bowel sounds are normal.  Musculoskeletal: Normal range of motion.  Neurological: He is alert and oriented to person, place, and time.  Skin: Skin is warm and dry. Capillary refill takes less than 2 seconds.  Psychiatric: He has a normal mood and affect.    Labs reviewed: Basic Metabolic Panel:  Recent Labs  09/15/16 0822 01/03/17 0808 04/24/17 0844  NA 137 138 137  K 4.9 5.3 5.0  CL 103 105 103  CO2 23 22 27   GLUCOSE 92 98 103*  BUN 33* 32* 29*  CREATININE 1.37* 1.31* 1.29*  CALCIUM 9.0 8.9 9.1   Liver Function Tests:  Recent Labs  04/24/17 0844  AST 21  ALT 21  BILITOT 0.7  PROT 7.1   No results for input(s): LIPASE, AMYLASE in the last 8760 hours. No results for input(s): AMMONIA in the last 8760 hours. CBC:  Recent Labs  06/03/16 0814 09/15/16 0822 04/24/17 0844  WBC 7.0 6.5 7.4  NEUTROABS 4,410 4,095 4,914  HGB 13.9 13.0* 13.4  HCT 41.1 40.1 39.9  MCV 87.1 88.1 86.7  PLT 176 164 172   Lipid Panel:  Recent Labs  09/15/16 0822 01/03/17 0808 04/24/17 0844  CHOL 141 140 149  HDL 42 40* 43  LDLCALC 70 50  --   TRIG 147 250* 182*  CHOLHDL 3.4 3.5 3.5   Lab Results  Component Value Date   HGBA1C 6.1 (H) 04/24/2017   Assessment/Plan 1. Type 2 diabetes mellitus with complication, without long-term current use of insulin (HCC) -control is good with 3 oral agents -currently onglyza expensive in donut hole, but so will be  any of the alternatives per pharmacy (they actually cost more) -cont current regimen, could give samples, but this will only mean he'll stay in the donut hole longer - Lipid panel; Future - Hemoglobin A1c; Future  2. Iron deficiency anemia due to chronic blood loss - normalized at present with iron - CBC with Differential/Platelet; Future  3. Essential hypertension, benign -bp at goal with current therapy - Basic metabolic panel; Future  4. 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 Mixed hyperlipidemia -cont zocor high dose which has gotten him near goal and recheck again before next appt - Lipid panel; Future  6.  Bilateral hearing loss due to cerumen impaction -both ears with cerumen, but only left obstructed and was flushed with warm water and peroxide and I used the metal tweezers to remove the large pieces of cerumen that remained making TM visible  7. Need for influenza vaccination - Flu vaccine HIGH DOSE PF (Fluzone High dose)  Labs/tests ordered:   Orders Placed This Encounter  Procedures  . Flu vaccine HIGH DOSE PF (Fluzone High dose)  . CBC with Differential/Platelet    Standing Status:   Future    Standing Expiration Date:   12/25/2017  . Basic metabolic panel    Standing Status:   Future    Standing Expiration Date:   12/25/2017    Order Specific Question:   Has the patient fasted?    Answer:   Yes  . Lipid panel    Standing Status:   Future    Standing Expiration Date:   12/25/2017    Order Specific Question:   Has the patient fasted?    Answer:   Yes  . Hemoglobin A1c    Standing Status:   Future    Standing Expiration Date:   12/25/2017    Next appt:  08/28/2017 med mgt, labs before   Jazyiah Yiu L. Teria Khachatryan, D.O. [facility] [address], [place] [address] Cell Phone (Mon-Fri 8am-5pm):  [phone] On  Call:  [phone] & follow prompts after 5pm & weekends Office Phone:  [phone] Office Fax:  [phone]

## 2017-04-27 NOTE — Patient Instructions (Signed)
We checked your pharmacy and onglyza is the cheapest of the medications in its class right now until you get through the donut hole.

## 2017-05-23 ENCOUNTER — Other Ambulatory Visit: Payer: Self-pay | Admitting: Internal Medicine

## 2017-07-27 ENCOUNTER — Encounter: Payer: Self-pay | Admitting: Nurse Practitioner

## 2017-07-27 ENCOUNTER — Ambulatory Visit (INDEPENDENT_AMBULATORY_CARE_PROVIDER_SITE_OTHER): Payer: Medicare Other | Admitting: Nurse Practitioner

## 2017-07-27 VITALS — BP 124/76 | HR 68 | Temp 97.8°F | Resp 17 | Ht 65.0 in | Wt 205.0 lb

## 2017-07-27 DIAGNOSIS — L309 Dermatitis, unspecified: Secondary | ICD-10-CM

## 2017-07-27 MED ORDER — PREDNISONE 10 MG (21) PO TBPK: ORAL_TABLET | ORAL | 0 refills | Status: DC

## 2017-07-27 NOTE — Patient Instructions (Addendum)
To use CeraVe  moisturizing lotion to body after bath Increase water intake  To use Prednisone Dose pack as directed to help with rash To call if this does not help

## 2017-07-27 NOTE — Progress Notes (Signed)
Careteam: Patient Care Team: Justin Curry, DO as PCP - General (Geriatric Medicine)  Advanced Directive information Does Patient Have a Medical Advance Directive?: No  No Known Allergies  Chief Complaint  Patient presents with  . Acute Visit    Pt is being seen due to red, itchy rash across back, sides, and both shoulders for about 1 month     HPI: Patient is a 76 y.o. male seen in the office today due to painful itchy rash that has been going on for around 2 months and getting worse.  Stings after he gets out of the shower Very uncomfortable.  To bilateral sides of abdomen, down right side of back, to bilateral arms and hands.  Spots on legs as well.  Review of Systems:  Review of Systems  Constitutional: Negative for chills and fever.  Respiratory: Negative for shortness of breath.   Cardiovascular: Negative for chest pain.  Skin: Positive for itching and rash.  Neurological: Negative for weakness.    Past Medical History:  Diagnosis Date  . Arthritis   . Chronic kidney disease    CKD- Stage  II  . Colon cancer ([place])    2014  . Colon polyps    adenomatous  . Diabetes mellitus without complication ([place])   . Eczema   . Hypercholesteremia   . Hypertension    Past Surgical History:  Procedure Laterality Date  . COLON SURGERY  2014 & 2015   resection of colon & removal of poylp ; Dr Kathleene Hazel, Dr Sherlene Shams  . COLONOSCOPY  09/29/2013  . COLONOSCOPY WITH PROPOFOL N/A 03/10/2015   Procedure: COLONOSCOPY WITH PROPOFOL;  Surgeon: Inda Castle, MD;  Location: [facility];  Service: Endoscopy;  Laterality: N/A;  . EXTERNAL EAR SURGERY  1954  . FLEXIBLE SIGMOIDOSCOPY N/A 07/21/2014   Procedure: FLEXIBLE SIGMOIDOSCOPY;  Surgeon: Inda Castle, MD;  Location: [facility];  Service: Endoscopy;  Laterality: N/A;   Social History:   reports that  has never smoked. he has never used smokeless tobacco. He reports that he drinks alcohol. He reports that he does not  use drugs.  Family History  Problem Relation Age of Onset  . Varicose Veins Mother   . Stroke Mother   . Glaucoma Mother   . Kidney disease Mother   . Hypertension Father   . Heart attack Father   . Cancer Brother        testicular cancer  . Hyperlipidemia Sister   . Meniere's disease Sister     Medications:   Medication List        Accurate as of 07/27/17  9:56 AM. Always use your most recent med list.          ferrous sulfate 325 (65 FE) MG tablet   finasteride 5 MG tablet Commonly known as:  PROSCAR Take 1 tablet (5 mg total) by mouth daily.   glipiZIDE 5 MG 24 hr tablet Commonly known as:  GLIPIZIDE XL Take 1 tablet (5 mg total) by mouth daily with breakfast.   glucose blood test strip Commonly known as:  ONE TOUCH ULTRA TEST Check blood sugar once daily as directed for diabetes DX E11.8   lisinopril 10 MG tablet Commonly known as:  PRINIVIL,ZESTRIL Take 0.5 tablets (5 mg total) by mouth daily.   metFORMIN 1000 MG tablet Commonly known as:  GLUCOPHAGE TAKE 1 TABLET BY MOUTH 2 TIMES DAILY WITH A MEAL.   saxagliptin HCl 5 MG Tabs tablet Commonly known as:  ONGLYZA Take one tablet by mouth once daily with largest meal to control blood sugar   simvastatin 80 MG tablet Commonly known as:  ZOCOR TAKE ONE TABLET BY MOUTH EVERY EVENING FOR HYPERLIPIDEMIA   tamsulosin 0.4 MG Caps capsule Commonly known as:  FLOMAX        Physical Exam:  Vitals:   07/27/17 0944  BP: 124/76  Pulse: 68  Resp: 17  Temp: 97.8 F (36.6 C)  TempSrc: Oral  SpO2: 98%  Weight: 205 lb (93 kg)  Height: 5\' 5"  (1.651 m)   Body mass index is 34.11 kg/m.  Physical Exam  Constitutional: He appears well-developed and well-nourished.  Cardiovascular: Normal rate and regular rhythm.  Pulmonary/Chest: Effort normal and breath sounds normal.  Skin: Skin is warm and dry. Rash noted. There is erythema.  Scattered raised red patches which are clustered on right side of back,  bilateral sides of abdomen, bilateral arms and hands and spots on legs as well. Excoriation to some areas    Labs reviewed: Basic Metabolic Panel: Recent Labs    09/15/16 0822 01/03/17 0808 04/24/17 0844  NA 137 138 137  K 4.9 5.3 5.0  CL 103 105 103  CO2 23 22 27   GLUCOSE 92 98 103*  BUN 33* 32* 29*  CREATININE 1.37* 1.31* 1.29*  CALCIUM 9.0 8.9 9.1   Liver Function Tests: Recent Labs    04/24/17 0844  AST 21  ALT 21  BILITOT 0.7  PROT 7.1   No results for input(s): LIPASE, AMYLASE in the last 8760 hours. No results for input(s): AMMONIA in the last 8760 hours. CBC: Recent Labs    09/15/16 0822 04/24/17 0844  WBC 6.5 7.4  NEUTROABS 4,095 4,914  HGB 13.0* 13.4  HCT 40.1 39.9  MCV 88.1 86.7  PLT 164 172   Lipid Panel: Recent Labs    09/15/16 0822 01/03/17 0808 04/24/17 0844  CHOL 141 140 149  HDL 42 40* 43  LDLCALC 70 50  --   TRIG 147 250* 182*  CHOLHDL 3.4 3.5 3.5   TSH: No results for input(s): TSH in the last 8760 hours. A1C: Lab Results  Component Value Date   HGBA1C 6.1 (H) 04/24/2017     Assessment/Plan 1. Severe eczema - predniSONE (STERAPRED UNI-PAK 21 TAB) 10 MG (21) TBPK tablet; Use as directed  Dispense: 21 tablet; Refill: 0 -to use Cerave lotion daily and as needed -to increase water intake  Next appt: 1 month with Dr Mariea Clonts, sooner if needed  [place] K. Harle Battiest  [facility] [phone] 8 am - 5 pm) [phone] (after hours)

## 2017-07-31 ENCOUNTER — Other Ambulatory Visit: Payer: Self-pay | Admitting: Internal Medicine

## 2017-08-13 ENCOUNTER — Other Ambulatory Visit: Payer: Self-pay | Admitting: Internal Medicine

## 2017-08-24 ENCOUNTER — Other Ambulatory Visit: Payer: Medicare Other

## 2017-08-24 DIAGNOSIS — E782 Mixed hyperlipidemia: Secondary | ICD-10-CM

## 2017-08-24 DIAGNOSIS — E118 Type 2 diabetes mellitus with unspecified complications: Secondary | ICD-10-CM

## 2017-08-24 DIAGNOSIS — I10 Essential (primary) hypertension: Secondary | ICD-10-CM

## 2017-08-24 DIAGNOSIS — D500 Iron deficiency anemia secondary to blood loss (chronic): Secondary | ICD-10-CM

## 2017-08-25 LAB — LIPID PANEL
Cholesterol: 147 mg/dL (ref <200)
HDL: 51 mg/dL (ref >40)
LDL Cholesterol (Calc): 75 mg/dL (calc)
Non-HDL Cholesterol (Calc): 96 mg/dL (calc) (ref <130)
Total CHOL/HDL Ratio: 2.9 (calc) (ref <5.0)
Triglycerides: 127 mg/dL (ref <150)

## 2017-08-25 LAB — CBC WITH DIFFERENTIAL/PLATELET
Basophils Absolute: 37 cells/uL (ref 0–200)
Basophils Relative: 0.6 %
Eosinophils Absolute: 130 cells/uL (ref 15–500)
Eosinophils Relative: 2.1 %
HCT: 40.2 % (ref 38.5–50.0)
Hemoglobin: 13.5 g/dL (ref 13.2–17.1)
Lymphs Abs: 1370 cells/uL (ref 850–3900)
MCH: 29 pg (ref 27.0–33.0)
MCHC: 33.6 g/dL (ref 32.0–36.0)
MCV: 86.5 fL (ref 80.0–100.0)
MPV: 9.8 fL (ref 7.5–12.5)
Monocytes Relative: 9.9 %
Neutro Abs: 4049 cells/uL (ref 1500–7800)
Neutrophils Relative %: 65.3 %
Platelets: 170 10*3/uL (ref 140–400)
RBC: 4.65 10*6/uL (ref 4.20–5.80)
RDW: 13 % (ref 11.0–15.0)
Total Lymphocyte: 22.1 %
WBC mixed population: 614 cells/uL (ref 200–950)
WBC: 6.2 10*3/uL (ref 3.8–10.8)

## 2017-08-25 LAB — BASIC METABOLIC PANEL
BUN/Creatinine Ratio: 16 (calc) (ref 6–22)
BUN: 24 mg/dL (ref 7–25)
CO2: 27 mmol/L (ref 20–32)
Calcium: 9.3 mg/dL (ref 8.6–10.3)
Chloride: 105 mmol/L (ref 98–110)
Creat: 1.48 mg/dL — ABNORMAL HIGH (ref 0.70–1.18)
Glucose, Bld: 92 mg/dL (ref 65–99)
Potassium: 4.9 mmol/L (ref 3.5–5.3)
Sodium: 139 mmol/L (ref 135–146)

## 2017-08-25 LAB — HEMOGLOBIN A1C
Hgb A1c MFr Bld: 6.5 % of total Hgb — ABNORMAL HIGH (ref <5.7)
Mean Plasma Glucose: 140 (calc)
eAG (mmol/L): 7.7 (calc)

## 2017-08-28 ENCOUNTER — Encounter: Payer: Self-pay | Admitting: Internal Medicine

## 2017-08-28 ENCOUNTER — Ambulatory Visit (INDEPENDENT_AMBULATORY_CARE_PROVIDER_SITE_OTHER): Payer: Medicare Other | Admitting: Internal Medicine

## 2017-08-28 VITALS — BP 130/70 | HR 73 | Temp 98.2°F | Wt 201.0 lb

## 2017-08-28 DIAGNOSIS — H6123 Impacted cerumen, bilateral: Secondary | ICD-10-CM | POA: Diagnosis not present

## 2017-08-28 DIAGNOSIS — R21 Rash and other nonspecific skin eruption: Secondary | ICD-10-CM | POA: Diagnosis not present

## 2017-08-28 DIAGNOSIS — E118 Type 2 diabetes mellitus with unspecified complications: Secondary | ICD-10-CM

## 2017-08-28 DIAGNOSIS — D500 Iron deficiency anemia secondary to blood loss (chronic): Secondary | ICD-10-CM

## 2017-08-28 DIAGNOSIS — I10 Essential (primary) hypertension: Secondary | ICD-10-CM | POA: Diagnosis not present

## 2017-08-28 MED ORDER — CLOTRIMAZOLE-BETAMETHASONE 1-0.05 % EX CREA: 1.0000 "application " | TOPICAL_CREAM | Freq: Two times a day (BID) | CUTANEOUS | 0 refills | Status: DC

## 2017-08-28 NOTE — Progress Notes (Signed)
Location:  [facility] clinic Provider:  Tiffany L. Mariea Clonts, D.O., C.M.D.  Code Status: DNR  Goals of Care:  Advanced Directives 07/27/2017  Does Patient Have a Medical Advance Directive? No  Type of Advance Directive -  Does patient want to make changes to medical advance directive? -  Copy of [place] in Chart? -     Chief Complaint  Patient presents with  . Medical Management of Chronic Issues    65mth follow-up    HPI: Patient is a 77 y.o. male seen today for medical management of chronic diseases.  Presents today alone in good spirits. He reports sleeping well, though he gets up to void (1-2) times a night to. He is eating well and feeds himself. He  showers and dresses hisself. He reports no incontinence. He denies falls. He denies memory problems. He still drives himself, though he prefers to not drive at night. He lives in sisters basement, but wants to move to [place]. He had a recent flare of eczema (07/27/17) which he states has mostly cleared post prednisone, though he still itches.  DM: Fasting bloods sugar at home is average 120. Diet recall: Breakfast: eggs, juice, toast Lunch: juice, peaches-no sugar, sandwich, soups Dinner: hamburger  Still has a scattered rash from previous visit, he was treated with prednisone for severe eczema, he thinks this helped some. He continues to have itching with rash as it didn't clear it up. He is continuing to apply a cream he is unsure of the name.   Past Medical History:  Diagnosis Date  . Arthritis   . Chronic kidney disease    CKD- Stage  II  . Colon cancer ([place])    2014  . Colon polyps    adenomatous  . Diabetes mellitus without complication ([place])   . Eczema   . Hypercholesteremia   . Hypertension     Past Surgical History:  Procedure Laterality Date  . COLON SURGERY  2014 & 2015   resection of colon & removal of poylp ; Dr Kathleene Hazel, Dr Sherlene Shams  . COLONOSCOPY  09/29/2013  . COLONOSCOPY WITH PROPOFOL N/A  03/10/2015   Procedure: COLONOSCOPY WITH PROPOFOL;  Surgeon: Inda Castle, MD;  [facility];  Service: Endoscopy;  Laterality: N/A;  . EXTERNAL EAR SURGERY  1954  . FLEXIBLE SIGMOIDOSCOPY N/A 07/21/2014   Procedure: FLEXIBLE SIGMOIDOSCOPY;  Surgeon: Inda Castle, MD;  [facility];  Service: Endoscopy;  Laterality: N/A;    No Known Allergies  Outpatient Encounter Medications as of 08/28/2017  Medication Sig  . ferrous sulfate 325 (65 FE) MG tablet Take 325 mg by mouth daily.  . finasteride (PROSCAR) 5 MG tablet Take 1 tablet (5 mg total) by mouth daily.  Marland Kitchen glipiZIDE (GLUCOTROL XL) 5 MG 24 hr tablet TAKE ONE TABLET BY MOUTH EVERY MORNING WITH BREAKFAST  . glucose blood (ONE TOUCH ULTRA TEST) test strip Check blood sugar once daily as directed for diabetes DX E11.8  . lisinopril (PRINIVIL,ZESTRIL) 10 MG tablet Take 0.5 tablets (5 mg total) by mouth daily.  . metFORMIN (GLUCOPHAGE) 1000 MG tablet TAKE 1 TABLET BY MOUTH 2 TIMES DAILY WITH A MEAL.  . saxagliptin HCl (ONGLYZA) 5 MG TABS tablet Take one tablet by mouth once daily with largest meal to control blood sugar  . simvastatin (ZOCOR) 80 MG tablet TAKE ONE TABLET BY MOUTH EVERY EVENING FOR HYPERLIPIDEMIA  . tamsulosin (FLOMAX) 0.4 MG CAPS capsule   . [DISCONTINUED] predniSONE (STERAPRED UNI-PAK 21  TAB) 10 MG (21) TBPK tablet Use as directed   No facility-administered encounter medications on file as of 08/28/2017.     Review of Systems:  Review of Systems  Constitutional: Negative.   HENT: Positive for congestion and hearing loss. Negative for sinus pain.   Eyes: Negative.   Respiratory: Negative.   Cardiovascular: Negative.   Gastrointestinal: Negative.   Genitourinary: Negative.   Musculoskeletal:       Left leg arthritis   Skin: Negative.   Neurological: Negative.   Psychiatric/Behavioral: Negative for memory loss. The patient does not have insomnia.     Health Maintenance  Topic Date Due  . FOOT  EXAM  09/19/2017  . OPHTHALMOLOGY EXAM  11/17/2017  . HEMOGLOBIN A1C  02/21/2018  . TETANUS/TDAP  02/08/2023  . INFLUENZA VACCINE  Completed  . PNA vac Low Risk Adult  Completed    Physical Exam: Vitals:   08/28/17 1441  BP: 130/70  Pulse: 73  Temp: 98.2 F (36.8 C)  TempSrc: Oral  SpO2: 94%  Weight: 201 lb (91.2 kg)   Body mass index is 33.45 kg/m. Physical Exam  Constitutional: He is oriented to person, place, and time. He appears well-developed and well-nourished.  HENT:  Head: Normocephalic.  Cerumen impaction bilaterally HOH  Cardiovascular: Normal rate, regular rhythm, normal heart sounds and intact distal pulses.  Pulmonary/Chest: Effort normal and breath sounds normal.  Abdominal: Soft. Bowel sounds are normal.  Musculoskeletal: Normal range of motion.  Neurological: He is alert and oriented to person, place, and time.  Skin: Skin is warm and dry. Rash noted. Rash is papular, maculopapular and urticarial.  Diffused maculopapular on the back with papular lesions in the center aspect of back and lateral aspect of flank bilaterally.    Psychiatric: He has a normal mood and affect. His behavior is normal.  Vitals reviewed.   Labs reviewed: Basic Metabolic Panel: Recent Labs    01/03/17 0808 04/24/17 0844 08/24/17 0812  NA 138 137 139  K 5.3 5.0 4.9  CL 105 103 105  CO2 22 27 27   GLUCOSE 98 103* 92  BUN 32* 29* 24  CREATININE 1.31* 1.29* 1.48*  CALCIUM 8.9 9.1 9.3   Liver Function Tests: Recent Labs    04/24/17 0844  AST 21  ALT 21  BILITOT 0.7  PROT 7.1   No results for input(s): LIPASE, AMYLASE in the last 8760 hours. No results for input(s): AMMONIA in the last 8760 hours. CBC: Recent Labs    09/15/16 0822 04/24/17 0844 08/24/17 0812  WBC 6.5 7.4 6.2  NEUTROABS 4,095 4,914 4,049  HGB 13.0* 13.4 13.5  HCT 40.1 39.9 40.2  MCV 88.1 86.7 86.5  PLT 164 172 170   Lipid Panel: Recent Labs    09/15/16 0822 01/03/17 0808 04/24/17 0844  08/24/17 0812  CHOL 141 140 149 147  HDL 42 40* 43 51  LDLCALC 70 50  --   --   TRIG 147 250* 182* 127  CHOLHDL 3.4 3.5 3.5 2.9   Lab Results  Component Value Date   HGBA1C 6.5 (H) 08/24/2017    Procedures since last visit: No results found.  Assessment/Plan  1. Type 2 diabetes mellitus with complication, without long-term current use of insulin (HCC) He A1c was 6.5 which is trending up over the last several checks. He is on good control with 3 oral medications.  He reports a diet still high in sugar and processed foods. The O[place]lyza is costly, but most other alternatives  are just as much.    - Lipid panel; Future - Hemoglobin A1c; Future  2. Essential hypertension, benign His blood pressure is stable today in office and the last three office visits. Will continue his current therapy.   - Basic metabolic panel; Future  3. Iron deficiency anemia due to chronic blood loss There was continued improvement in hemoglobin, will continue iron therapy. He does not report constipation issues at this time.  - CBC with Differential/Platelet; Future  4. Bilateral impacted cerumen Bilateral irrigation with warm water and peroxide today in office. He tolerated it well.   5. Rash of back He continues to have a diffused and scattered rash on the back and flank bilaterally. There is some concern with this being fungal in nature as it continues to itch post prednisone treatment. Will provide Lotrisone cream to help finish clearing the rash.  - clotrimazole-betamethasone (LOTRISONE) cream; Apply 1 application topically 2 (two) times daily. To itchy areas on shoulder blades and sides  Dispense: 45 g; Refill: 0   Labs/tests ordered:   Orders Placed This Encounter  Procedures  . CBC with Differential/Platelet    Standing Status:   Future    Standing Expiration Date:   04/28/2018  . Basic metabolic panel    Standing Status:   Future    Standing Expiration Date:   04/28/2018    Order Specific  Question:   Has the patient fasted?    Answer:   Yes  . Lipid panel    Standing Status:   Future    Standing Expiration Date:   04/28/2018    Order Specific Question:   Has the patient fasted?    Answer:   Yes  . Hemoglobin A1c    Standing Status:   Future    Standing Expiration Date:   04/28/2018   Next appt:     Karen Kays, DNP [facility] [phone], [place] [address] Cell Phone (Mon-Fri 8am-5pm):  [phone] On Call:  [phone] & follow prompts after 5pm & weekends Office Phone:  [phone] Office Fax:  [phone]

## 2017-08-30 ENCOUNTER — Other Ambulatory Visit: Payer: Self-pay | Admitting: Internal Medicine

## 2017-09-13 ENCOUNTER — Other Ambulatory Visit: Payer: Self-pay | Admitting: Internal Medicine

## 2017-09-20 ENCOUNTER — Other Ambulatory Visit: Payer: Self-pay | Admitting: Internal Medicine

## 2017-10-11 ENCOUNTER — Telehealth: Payer: Self-pay | Admitting: Internal Medicine

## 2017-10-11 NOTE — Telephone Encounter (Signed)
I called the patient to scheduled AWV-S (last AWV 09/19/16).  There was no answer and no option to leave a message. VDM (DD)

## 2017-10-17 ENCOUNTER — Other Ambulatory Visit: Payer: Self-pay | Admitting: Internal Medicine

## 2017-11-08 NOTE — Telephone Encounter (Signed)
°  °  °  I called the patient again to schedule AWV-S (last AWV 09/19/16).  There was no answer and no option to leave a message. VDM (DD)

## 2017-11-14 ENCOUNTER — Other Ambulatory Visit: Payer: Self-pay | Admitting: Internal Medicine

## 2017-11-20 DIAGNOSIS — E109 Type 1 diabetes mellitus without complications: Secondary | ICD-10-CM | POA: Diagnosis not present

## 2017-11-20 DIAGNOSIS — H2513 Age-related nuclear cataract, bilateral: Secondary | ICD-10-CM | POA: Diagnosis not present

## 2017-11-20 DIAGNOSIS — H25013 Cortical age-related cataract, bilateral: Secondary | ICD-10-CM | POA: Diagnosis not present

## 2017-11-23 ENCOUNTER — Encounter: Payer: Self-pay | Admitting: Nurse Practitioner

## 2017-11-23 ENCOUNTER — Ambulatory Visit (INDEPENDENT_AMBULATORY_CARE_PROVIDER_SITE_OTHER): Payer: Medicare Other | Admitting: Nurse Practitioner

## 2017-11-23 VITALS — BP 128/70 | HR 70 | Temp 97.6°F | Ht 65.0 in | Wt 201.6 lb

## 2017-11-23 DIAGNOSIS — H6123 Impacted cerumen, bilateral: Secondary | ICD-10-CM | POA: Diagnosis not present

## 2017-11-23 NOTE — Progress Notes (Signed)
Careteam: Patient Care Team: Gayland Curry, DO as PCP - General (Geriatric Medicine)  Advanced Directive information    No Known Allergies  Chief Complaint  Patient presents with  . Acute Visit    Pt c/o of ear wax in both ears, needs cleaning     HPI: Patient is a 77 y.o. male seen in the office today to have ears cleaned out. Pt reports right ear with decreased hearing and would like it cleaned out. No pain or drainage. Has to have ears cleaned out ~3 months.   Review of Systems:  Review of Systems  Constitutional: Negative for chills and fever.  HENT: Positive for hearing loss. Negative for ear discharge and ear pain.     Past Medical History:  Diagnosis Date  . Arthritis   . Chronic kidney disease    CKD- Stage  II  . Colon cancer ([place])    2014  . Colon polyps    adenomatous  . Diabetes mellitus without complication ([place])   . Eczema   . Hypercholesteremia   . Hypertension    Past Surgical History:  Procedure Laterality Date  . COLON SURGERY  2014 & 2015   resection of colon & removal of poylp ; Dr Kathleene Hazel, Dr Sherlene Shams  . COLONOSCOPY  09/29/2013  . COLONOSCOPY WITH PROPOFOL N/A 03/10/2015   Procedure: COLONOSCOPY WITH PROPOFOL;  Surgeon: Inda Castle, MD;  [facility];  Service: Endoscopy;  Laterality: N/A;  . EXTERNAL EAR SURGERY  1954  . FLEXIBLE SIGMOIDOSCOPY N/A 07/21/2014   Procedure: FLEXIBLE SIGMOIDOSCOPY;  Surgeon: Inda Castle, MD;  [facility];  Service: Endoscopy;  Laterality: N/A;   Social History:   reports that he has never smoked. He has never used smokeless tobacco. He reports that he drinks alcohol. He reports that he does not use drugs.  Family History  Problem Relation Age of Onset  . Varicose Veins Mother   . Stroke Mother   . Glaucoma Mother   . Kidney disease Mother   . Hypertension Father   . Heart attack Father   . Cancer Brother        testicular cancer  . Hyperlipidemia Sister   . Meniere's  disease Sister     Medications: Patient's Medications  New Prescriptions   No medications on file  Previous Medications   CLOTRIMAZOLE-BETAMETHASONE (LOTRISONE) CREAM    Apply 1 application topically 2 (two) times daily. To itchy areas on shoulder blades and sides   FERROUS SULFATE 325 (65 FE) MG TABLET    Take 325 mg by mouth daily.   FINASTERIDE (PROSCAR) 5 MG TABLET    TAKE ONE TABLET BY MOUTH DAILY   GLIPIZIDE (GLUCOTROL XL) 5 MG 24 HR TABLET    TAKE ONE TABLET BY MOUTH EVERY MORNING WITH BREAKFAST   GLUCOSE BLOOD (ONE TOUCH ULTRA TEST) TEST STRIP    USE TO CHECK BLOOD SUGARS ONCE DAILY AS DIRECTED FOR DIABETES  dxE11.8   LISINOPRIL (PRINIVIL,ZESTRIL) 10 MG TABLET    Take 0.5 tablets (5 mg total) by mouth daily.   METFORMIN (GLUCOPHAGE) 1000 MG TABLET    TAKE 1 TABLET BY MOUTH 2 TIMES DAILY WITH A MEAL.   SAXAGLIPTIN HCL (ONGLYZA) 5 MG TABS TABLET    TAKE ONE TABLET BY MOUTH DAILY WITH LARGEST MEAL TO CONTROL BLOOD SUGAR   SIMVASTATIN (ZOCOR) 80 MG TABLET    TAKE ONE TABLET BY MOUTH EVERY EVENING FOR HYPERLIPIDEMIA   TAMSULOSIN (FLOMAX) 0.4 MG  CAPS CAPSULE      Modified Medications   No medications on file  Discontinued Medications   No medications on file     Physical Exam:  Vitals:   11/23/17 0820  BP: 128/70  Pulse: 70  Temp: 97.6 F (36.4 C)  TempSrc: Oral  SpO2: 96%  Weight: 201 lb 9.6 oz (91.4 kg)  Height: 5\' 5"  (1.651 m)   Body mass index is 33.55 kg/m.  Physical Exam  Constitutional: He appears well-developed and well-nourished.  HENT:  Right Ear: External ear normal.  Left Ear: External ear normal.  Increased cerumen in ears bilaterally   Neurological: He is alert.  Skin: Skin is warm and dry.  Psychiatric: He has a normal mood and affect.    Labs reviewed: Basic Metabolic Panel: Recent Labs    01/03/17 0808 04/24/17 0844 08/24/17 0812  NA 138 137 139  K 5.3 5.0 4.9  CL 105 103 105  CO2 22 27 27   GLUCOSE 98 103* 92  BUN 32* 29* 24    CREATININE 1.31* 1.29* 1.48*  CALCIUM 8.9 9.1 9.3   Liver Function Tests: Recent Labs    04/24/17 0844  AST 21  ALT 21  BILITOT 0.7  PROT 7.1   No results for input(s): LIPASE, AMYLASE in the last 8760 hours. No results for input(s): AMMONIA in the last 8760 hours. CBC: Recent Labs    04/24/17 0844 08/24/17 0812  WBC 7.4 6.2  NEUTROABS 4,914 4,049  HGB 13.4 13.5  HCT 39.9 40.2  MCV 86.7 86.5  PLT 172 170   Lipid Panel: Recent Labs    01/03/17 0808 04/24/17 0844 08/24/17 0812  CHOL 140 149 147  HDL 40* 43 51  LDLCALC 50 78 75  TRIG 250* 182* 127  CHOLHDL 3.5 3.5 2.9   TSH: No results for input(s): TSH in the last 8760 hours. A1C: Lab Results  Component Value Date   HGBA1C 6.5 (H) 08/24/2017     Assessment/Plan 1. Excessive cerumen in ear canal, bilateral -removed via curette, pt tolerated well.   Next appt: ~11/2017 with Dr Sharee Holster [facility] [place], [facility] [phone]

## 2017-12-11 ENCOUNTER — Other Ambulatory Visit: Payer: Self-pay | Admitting: Internal Medicine

## 2017-12-11 DIAGNOSIS — N182 Chronic kidney disease, stage 2 (mild): Principal | ICD-10-CM

## 2017-12-11 DIAGNOSIS — E1122 Type 2 diabetes mellitus with diabetic chronic kidney disease: Secondary | ICD-10-CM

## 2017-12-11 DIAGNOSIS — I10 Essential (primary) hypertension: Secondary | ICD-10-CM

## 2017-12-14 ENCOUNTER — Telehealth: Payer: Self-pay | Admitting: Internal Medicine

## 2017-12-14 NOTE — Telephone Encounter (Signed)
I called the pt to schedule AWV, but there was no answer and no option to leave a message. VDM (DD) °

## 2017-12-21 ENCOUNTER — Other Ambulatory Visit: Payer: Medicare Other

## 2017-12-21 DIAGNOSIS — I10 Essential (primary) hypertension: Secondary | ICD-10-CM

## 2017-12-21 DIAGNOSIS — E118 Type 2 diabetes mellitus with unspecified complications: Secondary | ICD-10-CM

## 2017-12-21 DIAGNOSIS — D500 Iron deficiency anemia secondary to blood loss (chronic): Secondary | ICD-10-CM | POA: Diagnosis not present

## 2017-12-21 NOTE — Telephone Encounter (Signed)
I called the pt to schedule AWV at 1:45 on 01/08/18, but there was no answer and no option to leave a message. VDM (DD)

## 2017-12-22 LAB — BASIC METABOLIC PANEL
BUN/Creatinine Ratio: 20 (calc) (ref 6–22)
BUN: 26 mg/dL — ABNORMAL HIGH (ref 7–25)
CO2: 25 mmol/L (ref 20–32)
Calcium: 9 mg/dL (ref 8.6–10.3)
Chloride: 105 mmol/L (ref 98–110)
Creat: 1.33 mg/dL — ABNORMAL HIGH (ref 0.70–1.18)
Glucose, Bld: 101 mg/dL — ABNORMAL HIGH (ref 65–99)
Potassium: 4.8 mmol/L (ref 3.5–5.3)
Sodium: 138 mmol/L (ref 135–146)

## 2017-12-22 LAB — LIPID PANEL
Cholesterol: 150 mg/dL (ref <200)
HDL: 42 mg/dL (ref >40)
LDL Cholesterol (Calc): 85 mg/dL (calc)
Non-HDL Cholesterol (Calc): 108 mg/dL (calc) (ref <130)
Total CHOL/HDL Ratio: 3.6 (calc) (ref <5.0)
Triglycerides: 124 mg/dL (ref <150)

## 2017-12-22 LAB — CBC WITH DIFFERENTIAL/PLATELET
Basophils Absolute: 42 cells/uL (ref 0–200)
Basophils Relative: 0.6 %
Eosinophils Absolute: 112 cells/uL (ref 15–500)
Eosinophils Relative: 1.6 %
HCT: 39.3 % (ref 38.5–50.0)
Hemoglobin: 13.5 g/dL (ref 13.2–17.1)
Lymphs Abs: 1365 cells/uL (ref 850–3900)
MCH: 29.3 pg (ref 27.0–33.0)
MCHC: 34.4 g/dL (ref 32.0–36.0)
MCV: 85.2 fL (ref 80.0–100.0)
MPV: 10.2 fL (ref 7.5–12.5)
Monocytes Relative: 11 %
Neutro Abs: 4711 cells/uL (ref 1500–7800)
Neutrophils Relative %: 67.3 %
Platelets: 174 10*3/uL (ref 140–400)
RBC: 4.61 10*6/uL (ref 4.20–5.80)
RDW: 12.7 % (ref 11.0–15.0)
Total Lymphocyte: 19.5 %
WBC mixed population: 770 cells/uL (ref 200–950)
WBC: 7 10*3/uL (ref 3.8–10.8)

## 2017-12-22 LAB — HEMOGLOBIN A1C
Hgb A1c MFr Bld: 6.5 % of total Hgb — ABNORMAL HIGH (ref <5.7)
Mean Plasma Glucose: 140 (calc)
eAG (mmol/L): 7.7 (calc)

## 2018-01-08 ENCOUNTER — Ambulatory Visit (INDEPENDENT_AMBULATORY_CARE_PROVIDER_SITE_OTHER): Payer: Medicare Other | Admitting: Internal Medicine

## 2018-01-08 ENCOUNTER — Encounter: Payer: Self-pay | Admitting: Internal Medicine

## 2018-01-08 VITALS — BP 120/70 | HR 75 | Temp 98.0°F | Ht 65.0 in | Wt 204.0 lb

## 2018-01-08 DIAGNOSIS — E1169 Type 2 diabetes mellitus with other specified complication: Secondary | ICD-10-CM

## 2018-01-08 DIAGNOSIS — N182 Chronic kidney disease, stage 2 (mild): Secondary | ICD-10-CM

## 2018-01-08 DIAGNOSIS — I10 Essential (primary) hypertension: Secondary | ICD-10-CM

## 2018-01-08 DIAGNOSIS — E785 Hyperlipidemia, unspecified: Secondary | ICD-10-CM

## 2018-01-08 DIAGNOSIS — D500 Iron deficiency anemia secondary to blood loss (chronic): Secondary | ICD-10-CM

## 2018-01-08 DIAGNOSIS — E1122 Type 2 diabetes mellitus with diabetic chronic kidney disease: Secondary | ICD-10-CM | POA: Diagnosis not present

## 2018-01-08 DIAGNOSIS — N402 Nodular prostate without lower urinary tract symptoms: Secondary | ICD-10-CM

## 2018-01-08 MED ORDER — ATORVASTATIN CALCIUM 80 MG PO TABS: 80.0000 mg | ORAL_TABLET | Freq: Every day | ORAL | 3 refills | Status: DC

## 2018-01-08 NOTE — Progress Notes (Signed)
Location:  [facility] clinic Provider:  Florette Thai L. Mariea Clonts, D.O., C.M.D.  Code Status: DNR Goals of Care:  Advanced Directives 01/08/2018  Does Patient Have a Medical Advance Directive? Yes  Type of Advance Directive [place]  Does patient want to make changes to medical advance directive? No - Patient declined  Copy of [place] in Chart? Yes     Chief Complaint  Patient presents with  . Medical Management of Chronic Issues    31mth follow-up    HPI: Patient is a 77 y.o. [name] seen today for medical management of chronic diseases.    LDL has trended up from last check 75 to 85.  On max zocor.    Cr stable at 1.33.  DMII:  hba1c 6.5  Remains obese.  Needs foot exam and eye exam.  Foot exam done today.  He denies concerns.  No pain.  No dark BMs or bloody bms.  Sleeps well at night.  Gets up just once at night to urinate.    Says he only has a little earwax but not enough to flush (he was correct).  Past Medical History:  Diagnosis Date  . Arthritis   . Chronic kidney disease    CKD- Stage  II  . Colon cancer ([place])    2014  . Colon polyps    adenomatous  . Diabetes mellitus without complication ([place])   . Eczema   . Hypercholesteremia   . Hypertension     Past Surgical History:  Procedure Laterality Date  . COLON SURGERY  2014 & 2015   resection of colon & removal of poylp ; Dr Kathleene Hazel, Dr Sherlene Shams  . COLONOSCOPY  09/29/2013  . COLONOSCOPY WITH PROPOFOL N/A 03/10/2015   Procedure: COLONOSCOPY WITH PROPOFOL;  Surgeon: Inda Castle, MD;  Location: [facility];  Service: Endoscopy;  Laterality: N/A;  . EXTERNAL EAR SURGERY  1954  . FLEXIBLE SIGMOIDOSCOPY N/A 07/21/2014   Procedure: FLEXIBLE SIGMOIDOSCOPY;  Surgeon: Inda Castle, MD;  Location: [facility];  Service: Endoscopy;  Laterality: N/A;    No Known Allergies  Outpatient Encounter Medications as of 01/08/2018  Medication Sig  . clotrimazole-betamethasone (LOTRISONE)  cream Apply 1 application topically 2 (two) times daily. To itchy areas on shoulder blades and sides  . ferrous sulfate 325 (65 FE) MG tablet Take 325 mg by mouth daily.  . finasteride (PROSCAR) 5 MG tablet TAKE ONE TABLET BY MOUTH DAILY  . glipiZIDE (GLUCOTROL XL) 5 MG 24 hr tablet TAKE ONE TABLET BY MOUTH EVERY MORNING WITH BREAKFAST  . glucose blood (ONE TOUCH ULTRA TEST) test strip USE TO CHECK BLOOD SUGARS ONCE DAILY AS DIRECTED FOR DIABETES  dxE11.8  . lisinopril (PRINIVIL,ZESTRIL) 10 MG tablet TAKE ONE-HALF TABLET BY MOUTH DAILY  . metFORMIN (GLUCOPHAGE) 1000 MG tablet TAKE 1 TABLET BY MOUTH 2 TIMES DAILY WITH A MEAL.  . saxagliptin HCl (ONGLYZA) 5 MG TABS tablet TAKE ONE TABLET BY MOUTH DAILY WITH LARGEST MEAL TO CONTROL BLOOD SUGAR  . simvastatin (ZOCOR) 80 MG tablet TAKE ONE TABLET BY MOUTH EVERY EVENING FOR HYPERLIPIDEMIA  . tamsulosin (FLOMAX) 0.4 MG CAPS capsule    No facility-administered encounter medications on file as of 01/08/2018.     Review of Systems:  Review of Systems  Constitutional: Negative for chills, diaphoresis, fever, malaise/fatigue and weight loss.  HENT: Positive for hearing loss. Negative for congestion.   Eyes: Negative for blurred vision.  Respiratory: Negative for cough and shortness of breath.  Cardiovascular: Negative for chest pain, palpitations and leg swelling.  Gastrointestinal: Negative for abdominal pain, blood in stool, constipation, diarrhea and melena.  Genitourinary: Negative for dysuria.       1x nocturia at hs  Musculoskeletal: Negative for back pain, falls and joint pain.  Skin: Negative for itching and rash.  Neurological: Positive for sensory change. Negative for dizziness and loss of consciousness.       Of feet on exam today  Psychiatric/Behavioral: Negative for depression and memory loss. The patient is not nervous/anxious and does not have insomnia.     Health Maintenance  Topic Date Due  . FOOT EXAM  09/19/2017  .  OPHTHALMOLOGY EXAM  11/17/2017  . INFLUENZA VACCINE  03/01/2018  . HEMOGLOBIN A1C  06/23/2018  . TETANUS/TDAP  02/08/2023  . PNA vac Low Risk Adult  Completed    Physical Exam: Vitals:   01/08/18 1430  BP: 120/70  Pulse: 75  Temp: 98 F (36.7 C)  TempSrc: Oral  SpO2: 93%  Weight: 204 lb (92.5 kg)  Height: 5\' 5"  (1.651 m)   Body mass index is 33.95 kg/m. Physical Exam  Constitutional: He is oriented to person, place, and time. He appears well-developed and well-nourished. No distress.  HENT:  Head: Normocephalic and atraumatic.  Eyes:  Wears dark sunglasses  Cardiovascular: Normal rate, regular rhythm, normal heart sounds and intact distal pulses.  Pulmonary/Chest: Effort normal and breath sounds normal. No respiratory distress.  Abdominal: Soft. Bowel sounds are normal.  Musculoskeletal: Normal range of motion.  Neurological: He is alert and oriented to person, place, and time.  Foot exam done today  Skin: Skin is warm and dry.  Psychiatric: He has a normal mood and affect.    Labs reviewed: Basic Metabolic Panel: Recent Labs    04/24/17 0844 08/24/17 0812 12/21/17 0807  NA 137 139 138  K 5.0 4.9 4.8  CL 103 105 105  CO2 27 27 25   GLUCOSE 103* 92 101*  BUN 29* 24 26*  CREATININE 1.29* 1.48* 1.33*  CALCIUM 9.1 9.3 9.0   Liver Function Tests: Recent Labs    04/24/17 0844  AST 21  ALT 21  BILITOT 0.7  PROT 7.1   No results for input(s): LIPASE, AMYLASE in the last 8760 hours. No results for input(s): AMMONIA in the last 8760 hours. CBC: Recent Labs    04/24/17 0844 08/24/17 0812 12/21/17 0807  WBC 7.4 6.2 7.0  NEUTROABS 4,914 4,049 4,711  HGB 13.4 13.5 13.5  HCT 39.9 40.2 39.3  MCV 86.7 86.5 85.2  PLT 172 170 174   Lipid Panel: Recent Labs    04/24/17 0844 08/24/17 0812 12/21/17 0807  CHOL 149 147 150  HDL 43 51 42  LDLCALC 78 75 85  TRIG 182* 127 124  CHOLHDL 3.5 2.9 3.6   Lab Results  Component Value Date   HGBA1C 6.5 (H)  12/21/2017    Assessment/Plan 1. Hyperlipidemia associated with type 2 diabetes mellitus ([place]) - worse w/ LDL up to 85 - atorvastatin (LIPITOR) 80 MG tablet; Take 1 tablet (80 mg total) by mouth daily.  Dispense: 90 tablet; Refill: 3 - Lipid panel; Future  2. Essential hypertension, benign - bp at goal, cont same regimen - Basic metabolic panel; Future  3. Iron deficiency anemia due to chronic blood loss - cont iron supplement--h/h stable now - CBC with Differential/Platelet; Future  4. Prostate nodule without urinary obstruction -cont proscar therapy, no new symptoms, doing fine  5. Type 2  diabetes mellitus with stage 2 chronic kidney disease, without long-term current use of insulin ([facility]) - cont same regimen, improve diet, change zocor to lipitor in hopes of improved  - Hemoglobin A1c; Future  Labs/tests ordered:   Orders Placed This Encounter  Procedures  . Hemoglobin A1c    Standing Status:   Future    Standing Expiration Date:   09/10/2018  . Lipid panel    Standing Status:   Future    Standing Expiration Date:   09/10/2018    Order Specific Question:   Has the patient fasted?    Answer:   Yes  . CBC with Differential/Platelet    Standing Status:   Future    Standing Expiration Date:   09/10/2018  . Basic metabolic panel    Standing Status:   Future    Standing Expiration Date:   09/10/2018    Order Specific Question:   Has the patient fasted?    Answer:   Yes   Next appt:  05/24/2018 med mgt, labs before   [place] Patrizia Paule, D.O. [facility] [address], [place] [address] Cell Phone (Mon-Fri 8am-5pm):  [phone] On Call:  [phone] & follow prompts after 5pm & weekends Office Phone:  [phone] Office Fax:  [phone]

## 2018-02-22 ENCOUNTER — Other Ambulatory Visit: Payer: Self-pay | Admitting: Internal Medicine

## 2018-03-11 ENCOUNTER — Other Ambulatory Visit: Payer: Self-pay | Admitting: Internal Medicine

## 2018-03-29 NOTE — Telephone Encounter (Signed)
I left a message w/ pt's sister asking her to confirm pt's AWV-S w/ Clarise Cruz on 05/22/18 after labs. VDM (DD)

## 2018-04-20 NOTE — Telephone Encounter (Signed)
The patient called me back and confirmed the AWV appt w/ Clarise Cruz after labs on 05/22/18. VDM (DD)

## 2018-04-26 ENCOUNTER — Other Ambulatory Visit: Payer: Self-pay | Admitting: Internal Medicine

## 2018-04-30 ENCOUNTER — Encounter: Payer: Self-pay | Admitting: Nurse Practitioner

## 2018-04-30 ENCOUNTER — Ambulatory Visit (INDEPENDENT_AMBULATORY_CARE_PROVIDER_SITE_OTHER): Payer: Medicare Other | Admitting: Nurse Practitioner

## 2018-04-30 VITALS — BP 124/76 | HR 68 | Temp 97.9°F | Ht 65.0 in | Wt 205.2 lb

## 2018-04-30 DIAGNOSIS — H6123 Impacted cerumen, bilateral: Secondary | ICD-10-CM

## 2018-04-30 DIAGNOSIS — Z23 Encounter for immunization: Secondary | ICD-10-CM

## 2018-04-30 NOTE — Progress Notes (Signed)
Careteam: Patient Care Team: Gayland Curry, DO as PCP - General (Geriatric Medicine)  Advanced Directive information Does Patient Have a Medical Advance Directive?: No  No Known Allergies  Chief Complaint  Patient presents with  . Acute Visit    pt is being seen due to his ears feeling stopped up for about 3 or more months.   . Audit C screening    Score of 4     HPI: Patient is a 77 y.o. male seen in the office today due to ears feeling full and stopped up. Pt reports hearing has been hard for several weeks.  No pain or drainage. Pt with hx of cerumen impactions                 Review of Systems:  Review of Systems  Constitutional: Negative for chills, fever and malaise/fatigue.  HENT: Positive for hearing loss.        Ear fullness  Skin: Negative for itching and rash.  Neurological: Loss of consciousness:     Past Medical History:  Diagnosis Date  . Arthritis   . Chronic kidney disease    CKD- Stage  II  . Colon cancer ([place])    2014  . Colon polyps    adenomatous  . Diabetes mellitus without complication ([place])   . Eczema   . Hypercholesteremia   . Hypertension    Past Surgical History:  Procedure Laterality Date  . COLON SURGERY  2014 & 2015   resection of colon & removal of poylp ; Dr Kathleene Hazel, Dr Sherlene Shams  . COLONOSCOPY  09/29/2013  . COLONOSCOPY WITH PROPOFOL N/A 03/10/2015   Procedure: COLONOSCOPY WITH PROPOFOL;  Surgeon: Inda Castle, MD;  [facility]: [facility];  Service: Endoscopy;  Laterality: N/A;  . EXTERNAL EAR SURGERY  1954  . FLEXIBLE SIGMOIDOSCOPY N/A 07/21/2014   Procedure: FLEXIBLE SIGMOIDOSCOPY;  Surgeon: Inda Castle, MD;  Location: [facility];  Service: Endoscopy;  Laterality: N/A;   Social History:   reports that he has never smoked. He has never used smokeless tobacco. He reports that he drinks alcohol. He reports that he does not use drugs.  Family History  Problem Relation Age of Onset  . Varicose Veins Mother   .  Stroke Mother   . Glaucoma Mother   . Kidney disease Mother   . Hypertension Father   . Heart attack Father   . Cancer Brother        testicular cancer  . Hyperlipidemia Sister   . Meniere's disease Sister     Medications: Patient's Medications  New Prescriptions   No medications on file  Previous Medications   ATORVASTATIN (LIPITOR) 80 MG TABLET    Take 1 tablet (80 mg total) by mouth daily.   CLOTRIMAZOLE-BETAMETHASONE (LOTRISONE) CREAM    Apply 1 application topically 2 (two) times daily. To itchy areas on shoulder blades and sides   FERROUS SULFATE 325 (65 FE) MG TABLET    Take 325 mg by mouth daily.   FINASTERIDE (PROSCAR) 5 MG TABLET    TAKE ONE TABLET BY MOUTH DAILY   GLIPIZIDE (GLUCOTROL XL) 5 MG 24 HR TABLET    TAKE ONE TABLET BY MOUTH EVERY MORNING WITH BREAKFAST   GLUCOSE BLOOD (ONE TOUCH ULTRA TEST) TEST STRIP    USE TO CHECK BLOOD SUGARS ONCE DAILY AS DIRECTED FOR DIABETES  dxE11.8   LISINOPRIL (PRINIVIL,ZESTRIL) 10 MG TABLET    TAKE ONE-HALF TABLET BY MOUTH DAILY   METFORMIN (  GLUCOPHAGE) 1000 MG TABLET    TAKE 1 TABLET BY MOUTH 2 TIMES DAILY WITH A MEAL.   SAXAGLIPTIN HCL (ONGLYZA) 5 MG TABS TABLET    TAKE ONE TABLET BY MOUTH DAILY WITH LARGEST MEAL TO CONTROL BLOOD SUGAR   TAMSULOSIN (FLOMAX) 0.4 MG CAPS CAPSULE    Take 0.4 mg by mouth daily.   Modified Medications   No medications on file  Discontinued Medications   No medications on file     Physical Exam:  Vitals:   04/30/18 0818  BP: 124/76  Pulse: 68  Temp: 97.9 F (36.6 C)  TempSrc: Oral  SpO2: 95%  Weight: 205 lb 3.2 oz (93.1 kg)  Height: 5\' 5"  (1.651 m)   Body mass index is 34.15 kg/m.  Physical Exam  Constitutional: He appears well-developed and well-nourished.  HENT:  Head: Normocephalic and atraumatic.  Right Ear: External ear normal.  Left Ear: External ear normal.  Bilateral cerumen impactions   Neck: Normal range of motion.  Lymphadenopathy:    He has no cervical adenopathy.     Labs reviewed: Basic Metabolic Panel: Recent Labs    08/24/17 0812 12/21/17 0807  NA 139 138  K 4.9 4.8  CL 105 105  CO2 27 25  GLUCOSE 92 101*  BUN 24 26*  CREATININE 1.48* 1.33*  CALCIUM 9.3 9.0   Liver Function Tests: No results for input(s): AST, ALT, ALKPHOS, BILITOT, PROT, ALBUMIN in the last 8760 hours. No results for input(s): LIPASE, AMYLASE in the last 8760 hours. No results for input(s): AMMONIA in the last 8760 hours. CBC: Recent Labs    08/24/17 0812 12/21/17 0807  WBC 6.2 7.0  NEUTROABS 4,049 4,711  HGB 13.5 13.5  HCT 40.2 39.3  MCV 86.5 85.2  PLT 170 174   Lipid Panel: Recent Labs    08/24/17 0812 12/21/17 0807  CHOL 147 150  HDL 51 42  LDLCALC 75 85  TRIG 127 124  CHOLHDL 2.9 3.6   TSH: No results for input(s): TSH in the last 8760 hours. A1C: Lab Results  Component Value Date   HGBA1C 6.5 (H) 12/21/2017     Assessment/Plan 1. Bilateral hearing loss due to cerumen impaction Ear lavage done by MA however ear curette used to fully remove impaction. Pt tolerated well and was able to hear better once complete.   Next appt: to keep appts as scheduled. 05/22/2018 [employer]. [place], [facility] [phone]

## 2018-05-21 ENCOUNTER — Other Ambulatory Visit: Payer: Medicare Other

## 2018-05-22 ENCOUNTER — Other Ambulatory Visit: Payer: Medicare Other

## 2018-05-22 ENCOUNTER — Ambulatory Visit (INDEPENDENT_AMBULATORY_CARE_PROVIDER_SITE_OTHER): Payer: Medicare Other

## 2018-05-22 VITALS — BP 142/78 | HR 75 | Temp 98.0°F | Ht 65.0 in | Wt 204.0 lb

## 2018-05-22 DIAGNOSIS — Z0000 Encounter for general adult medical examination without abnormal findings: Secondary | ICD-10-CM | POA: Diagnosis not present

## 2018-05-22 DIAGNOSIS — I10 Essential (primary) hypertension: Secondary | ICD-10-CM | POA: Diagnosis not present

## 2018-05-22 DIAGNOSIS — D500 Iron deficiency anemia secondary to blood loss (chronic): Secondary | ICD-10-CM | POA: Diagnosis not present

## 2018-05-22 DIAGNOSIS — E1169 Type 2 diabetes mellitus with other specified complication: Secondary | ICD-10-CM | POA: Diagnosis not present

## 2018-05-22 DIAGNOSIS — E785 Hyperlipidemia, unspecified: Secondary | ICD-10-CM | POA: Diagnosis not present

## 2018-05-22 DIAGNOSIS — E1122 Type 2 diabetes mellitus with diabetic chronic kidney disease: Secondary | ICD-10-CM | POA: Diagnosis not present

## 2018-05-22 NOTE — Patient Instructions (Signed)
Justin [name] , Thank you for taking time to come for your Medicare Wellness Visit. I appreciate your ongoing commitment to your health goals. Please review the following plan we discussed and let me know if I can assist you in the future.   Screening recommendations/referrals: Colonoscopy excluded, over age 78 Recommended yearly ophthalmology/optometry visit for glaucoma screening and checkup Recommended yearly dental visit for hygiene and checkup  Vaccinations: Influenza vaccine up to date Pneumococcal vaccine up to date, completed Tdap vaccine up to date, due 02/08/2023 Shingles vaccine due, declined    Advanced directives: In chart  Conditions/risks identified: none  Next appointment: Dr. Mariea Clonts 05/24/2018 @ 8:30am            Justin Dense, RN 05/27/2019 @ 8:30am  Preventive Care 65 Years and Older, Male Preventive care refers to lifestyle choices and visits with your health care provider that can promote health and wellness. What does preventive care include?  A yearly physical exam. This is also called an annual well check.  Dental exams once or twice a year.  Routine eye exams. Ask your health care provider how often you should have your eyes checked.  Personal lifestyle choices, including:  Daily care of your teeth and gums.  Regular physical activity.  Eating a healthy diet.  Avoiding tobacco and drug use.  Limiting alcohol use.  Practicing safe sex.  Taking low doses of aspirin every day.  Taking vitamin and mineral supplements as recommended by your health care provider. What happens during an annual well check? The services and screenings done by your health care provider during your annual well check will depend on your age, overall health, lifestyle risk factors, and family history of disease. Counseling  Your health care provider may ask you questions about your:  Alcohol use.  Tobacco use.  Drug use.  Emotional well-being.  Home and relationship  well-being.  Sexual activity.  Eating habits.  History of falls.  Memory and ability to understand (cognition).  Work and work Statistician. Screening  You may have the following tests or measurements:  Height, weight, and BMI.  Blood pressure.  Lipid and cholesterol levels. These may be checked every 5 years, or more frequently if you are over 18 years old.  Skin check.  Lung cancer screening. You may have this screening every year starting at age 39 if you have a 30-pack-year history of smoking and currently smoke or have quit within the past 15 years.  Fecal occult blood test (FOBT) of the stool. You may have this test every year starting at age 46.  Flexible sigmoidoscopy or colonoscopy. You may have a sigmoidoscopy every 5 years or a colonoscopy every 10 years starting at age 20.  Prostate cancer screening. Recommendations will vary depending on your family history and other risks.  Hepatitis C blood test.  Hepatitis B blood test.  Sexually transmitted disease (STD) testing.  Diabetes screening. This is done by checking your blood sugar (glucose) after you have not eaten for a while (fasting). You may have this done every 1-3 years.  Abdominal aortic aneurysm (AAA) screening. You may need this if you are a current or former smoker.  Osteoporosis. You may be screened starting at age 75 if you are at high risk. Talk with your health care provider about your test results, treatment options, and if necessary, the need for more tests. Vaccines  Your health care provider may recommend certain vaccines, such as:  Influenza vaccine. This is recommended every year.  Tetanus, diphtheria, and acellular pertussis (Tdap, Td) vaccine. You may need a Td booster every 10 years.  Zoster vaccine. You may need this after age 78.  Pneumococcal 13-valent conjugate (PCV13) vaccine. One dose is recommended after age 75.  Pneumococcal polysaccharide (PPSV23) vaccine. One dose is  recommended after age 60. Talk to your health care provider about which screenings and vaccines you need and how often you need them. This information is not intended to replace advice given to you by your health care provider. Make sure you discuss any questions you have with your health care provider. Document Released: 08/14/2015 Document Revised: 04/06/2016 Document Reviewed: 05/19/2015 Elsevier Interactive Patient Education  2017 [place] Prevention in the Home Falls can cause injuries. They can happen to people of all ages. There are many things you can do to make your home safe and to help prevent falls. What can I do on the outside of my home?  Regularly fix the edges of walkways and driveways and fix any cracks.  Remove anything that might make you trip as you walk through a door, such as a raised step or threshold.  Trim any bushes or trees on the path to your home.  Use bright outdoor lighting.  Clear any walking paths of anything that might make someone trip, such as rocks or tools.  Regularly check to see if handrails are loose or broken. Make sure that both sides of any steps have handrails.  Any raised decks and porches should have guardrails on the edges.  Have any leaves, snow, or ice cleared regularly.  Use sand or salt on walking paths during winter.  Clean up any spills in your garage right away. This includes oil or grease spills. What can I do in the bathroom?  Use night lights.  Install grab bars by the toilet and in the tub and shower. Do not use towel bars as grab bars.  Use non-skid mats or decals in the tub or shower.  If you need to sit down in the shower, use a plastic, non-slip stool.  Keep the floor dry. Clean up any water that spills on the floor as soon as it happens.  Remove soap buildup in the tub or shower regularly.  Attach bath mats securely with double-sided non-slip rug tape.  Do not have throw rugs and other things on  the floor that can make you trip. What can I do in the bedroom?  Use night lights.  Make sure that you have a light by your bed that is easy to reach.  Do not use any sheets or blankets that are too big for your bed. They should not hang down onto the floor.  Have a firm chair that has side arms. You can use this for support while you get dressed.  Do not have throw rugs and other things on the floor that can make you trip. What can I do in the kitchen?  Clean up any spills right away.  Avoid walking on wet floors.  Keep items that you use a lot in easy-to-reach places.  If you need to reach something above you, use a strong step stool that has a grab bar.  Keep electrical cords out of the way.  Do not use floor polish or wax that makes floors slippery. If you must use wax, use non-skid floor wax.  Do not have throw rugs and other things on the floor that can make you trip. What can I do  with my stairs?  Do not leave any items on the stairs.  Make sure that there are handrails on both sides of the stairs and use them. Fix handrails that are broken or loose. Make sure that handrails are as [place] as the stairways.  Check any carpeting to make sure that it is firmly attached to the stairs. Fix any carpet that is loose or worn.  Avoid having throw rugs at the top or bottom of the stairs. If you do have throw rugs, attach them to the floor with carpet tape.  Make sure that you have a light switch at the top of the stairs and the bottom of the stairs. If you do not have them, ask someone to add them for you. What else can I do to help prevent falls?  Wear shoes that:  Do not have high heels.  Have rubber bottoms.  Are comfortable and fit you well.  Are closed at the toe. Do not wear sandals.  If you use a stepladder:  Make sure that it is fully opened. Do not climb a closed stepladder.  Make sure that both sides of the stepladder are locked into place.  Ask someone to  hold it for you, if possible.  Clearly mark and make sure that you can see:  Any grab bars or handrails.  First and last steps.  Where the edge of each step is.  Use tools that help you move around (mobility aids) if they are needed. These include:  Canes.  Walkers.  Scooters.  Crutches.  Turn on the lights when you go into a dark area. Replace any light bulbs as soon as they burn out.  Set up your furniture so you have a clear path. Avoid moving your furniture around.  If any of your floors are uneven, fix them.  If there are any pets around you, be aware of where they are.  Review your medicines with your doctor. Some medicines can make you feel dizzy. This can increase your chance of falling. Ask your doctor what other things that you can do to help prevent falls. This information is not intended to replace advice given to you by your health care provider. Make sure you discuss any questions you have with your health care provider. Document Released: 05/14/2009 Document Revised: 12/24/2015 Document Reviewed: 08/22/2014 Elsevier Interactive Patient Education  2017 [employer].

## 2018-05-22 NOTE — Progress Notes (Signed)
Subjective:   Justin [name] is a 76 y.o. male who presents for Medicare Annual/Subsequent preventive examination.  Last AWV-09/19/2016      Objective:    Vitals: BP (!) 142/78 (BP Location: Left Arm, Patient Position: Sitting)   Pulse 75   Temp 98 F (36.7 C) (Oral)   Ht 5\' 5"  (1.651 m)   Wt 204 lb (92.5 kg)   SpO2 93%   BMI 33.95 kg/m   Body mass index is 33.95 kg/m.  Advanced Directives 05/22/2018 04/30/2018 01/08/2018 07/27/2017 01/05/2017 10/03/2016 09/19/2016  Does Patient Have a Medical Advance Directive? Yes No Yes No Yes Yes Yes  Type of Advance Directive Healthcare Power of [place]  Does patient want to make changes to medical advance directive? No - Patient declined - No - Patient declined - - - -  Copy of [place] in Chart? Yes - Yes - Yes Yes -    Tobacco Social History   Tobacco Use  Smoking Status Never Smoker  Smokeless Tobacco Never Used     Counseling given: Not Answered   Clinical Intake:  Pre-visit preparation completed: No  Pain : No/denies pain     Diabetes: No  How often do you need to have someone help you when you read instructions, pamphlets, or other written materials from your doctor or pharmacy?: 1 - Never What is the last grade level you completed in school?: High school  Interpreter Needed?: No  Information entered by :: Tyson Dense, RN  Past Medical History:  Diagnosis Date  . Arthritis   . Chronic kidney disease    CKD- Stage  II  . Colon cancer ([place])    2014  . Colon polyps    adenomatous  . Diabetes mellitus without complication ([place])   . Eczema   . Hypercholesteremia   . Hypertension    Past Surgical History:  Procedure Laterality Date  . COLON SURGERY  2014 & 2015   resection of colon & removal of poylp ; Dr Kathleene Hazel, Dr Sherlene Shams  . COLONOSCOPY  09/29/2013  . COLONOSCOPY  WITH PROPOFOL N/A 03/10/2015   Procedure: COLONOSCOPY WITH PROPOFOL;  Surgeon: Inda Castle, MD;  Location: [facility];  Service: Endoscopy;  Laterality: N/A;  . EXTERNAL EAR SURGERY  1954  . FLEXIBLE SIGMOIDOSCOPY N/A 07/21/2014   Procedure: FLEXIBLE SIGMOIDOSCOPY;  Surgeon: Inda Castle, MD;  Location: [facility];  Service: Endoscopy;  Laterality: N/A;   Family History  Problem Relation Age of Onset  . Varicose Veins Mother   . Stroke Mother   . Glaucoma Mother   . Kidney disease Mother   . Hypertension Father   . Heart attack Father   . Cancer Brother        testicular cancer  . Hyperlipidemia Sister   . Meniere's disease Sister    Social History   Socioeconomic History  . Marital status: Single    Spouse name: Not on file  . Number of children: Not on file  . Years of education: Not on file  . Highest education level: Not on file  Occupational History  . Not on file  Social Needs  . Financial resource strain: Not hard at all  . Food insecurity:    Worry: Never true    Inability: Never true  . Transportation needs:    Medical: No    Non-medical: No  Tobacco Use  . Smoking status: Never Smoker  . Smokeless tobacco: Never Used  Substance and Sexual Activity  . Alcohol use: Yes    Alcohol/week: 7.0 standard drinks    Types: 7 Cans of beer per week    Comment: one beer a day  . Drug use: No  . Sexual activity: Never  Lifestyle  . Physical activity:    Days per week: 7 days    Minutes per session: 10 min  . Stress: Not at all  Relationships  . Social connections:    Talks on phone: More than three times a week    Gets together: More than three times a week    Attends religious service: More than 4 times per year    Active member of club or organization: No    Attends meetings of clubs or organizations: Never    Relationship status: Never married  Other Topics Concern  . Not on file  Social History Narrative   Single, lives in 2 stories home with 3  people and 1 dog.     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 atorvastatin (LIPITOR) 80 MG tablet Take 1 tablet (80 mg total) by mouth daily.  . clotrimazole-betamethasone (LOTRISONE) cream Apply 1 application topically 2 (two) times daily. To itchy areas on shoulder blades and sides  . ferrous sulfate 325 (65 FE) MG tablet Take 325 mg by mouth daily.  . finasteride (PROSCAR) 5 MG tablet TAKE ONE TABLET BY MOUTH DAILY  . glipiZIDE (GLUCOTROL XL) 5 MG 24 hr tablet TAKE ONE TABLET BY MOUTH EVERY MORNING WITH BREAKFAST  . glucose blood (ONE TOUCH ULTRA TEST) test strip USE TO CHECK BLOOD SUGARS ONCE DAILY AS DIRECTED FOR DIABETES  dxE11.8  . lisinopril (PRINIVIL,ZESTRIL) 10 MG tablet TAKE ONE-HALF TABLET BY MOUTH DAILY  . metFORMIN (GLUCOPHAGE) 1000 MG tablet TAKE 1 TABLET BY MOUTH 2 TIMES DAILY WITH A MEAL.  . saxagliptin HCl (ONGLYZA) 5 MG TABS tablet TAKE ONE TABLET BY MOUTH DAILY WITH LARGEST MEAL TO CONTROL BLOOD SUGAR  . tamsulosin (FLOMAX) 0.4 MG CAPS capsule Take 0.4 mg by mouth daily.    No facility-administered encounter medications on file as of 05/22/2018.     Activities of Daily Living In your present state of health, do you have any difficulty performing the following activities: 05/22/2018  Hearing? Y  Vision? N  Difficulty concentrating or making decisions? N  Walking or climbing stairs? N  Dressing or bathing? N  Doing errands, shopping? N  Preparing Food and eating ? N  Using the Toilet? N  In the past six months, have you accidently leaked urine? N  Do you have problems with loss of bowel control? N  Managing your Medications? N  Managing your Finances? N  Housekeeping or managing your Housekeeping? N  Some recent data might be hidden    Patient Care Team: Gayland Curry, DO as PCP - General (Geriatric Medicine)   Assessment:    This is a routine wellness examination for Justin [name].  Exercise Activities and Dietary recommendations Current Exercise Habits: Home exercise routine, Type of exercise: walking, Time (Minutes): 15, Frequency (Times/Week): 7, Weekly Exercise (Minutes/Week): 105, Intensity: Mild, Exercise limited by: None identified  Goals    . <enter goal here>     Starting 09/19/16, I will maintain  my current lifestyle.        Fall Risk Fall Risk  05/22/2018 04/30/2018 11/23/2017 08/28/2017 07/27/2017  Falls in the past year? No No No No No  Number falls in past yr: - - - - -  Comment - - - - -  Injury with Fall? - - - - -  Comment - - - - -   Is the patient's home free of loose throw rugs in walkways, pet beds, electrical cords, etc?   yes      Grab bars in the bathroom? yes      Handrails on the stairs?   yes      Adequate lighting?   yes  Depression Screen PHQ 2/9 Scores 05/22/2018 01/08/2018 08/28/2017 04/27/2017  PHQ - 2 Score 0 0 0 0    Cognitive Function MMSE - Mini Mental State Exam 05/22/2018 09/19/2016 08/13/2015 05/30/2014  Orientation to time 5 5 5 5   Orientation to Place 4 5 4 4   Registration 3 3 3 3   Attention/ Calculation 5 1 5 3   Recall 2 3 3 2   Language- name 2 objects 2 2 2 2   Language- repeat 1 1 1 1   Language- follow 3 step command 3 3 3 3   Language- read & follow direction 1 1 1 1   Write a sentence 1 1 1 1   Copy design 0 0 0 0  Total score 27 25 28 25         Immunization History  Administered Date(s) Administered  . Influenza, High Dose Seasonal PF 04/27/2017, 04/30/2018  . Influenza,inj,Quad PF,6+ Mos 05/14/2015, 06/03/2016  . Influenza-Unspecified 05/26/2011, 08/15/2012, 05/01/2013  . Pneumococcal Conjugate-13 08/13/2015  . Pneumococcal Polysaccharide-23 03/12/2009  . Tdap 02/07/2013  . Zoster 01/03/2016    Qualifies for Shingles Vaccine? Yes, educated and declined  Screening Tests Health Maintenance  Topic Date Due  . OPHTHALMOLOGY EXAM  11/17/2017  .  HEMOGLOBIN A1C  06/23/2018  . FOOT EXAM  01/09/2019  . TETANUS/TDAP  02/08/2023  . INFLUENZA VACCINE  Completed  . PNA vac Low Risk Adult  Completed   Cancer Screenings: Lung: Low Dose CT Chest recommended if Age 44-80 years, 30 pack-year currently smoking OR have quit w/in 15years. Patient does not qualify. Colorectal: up to date  Additional Screenings:  Hepatitis C Screening: declined  Sees Dr. Michel Santee for annual eye exams, records requested    Plan:    I have personally reviewed and addressed the Medicare Annual Wellness questionnaire and have noted the following in the patient's chart:  A. Medical and social history B. Use of alcohol, tobacco or illicit drugs  C. Current medications and supplements D. Functional ability and status E.  Nutritional status F.  Physical activity G. Advance directives H. List of other physicians I.  Hospitalizations, surgeries, and ER visits in previous 12 months J.  [place] to include hearing, vision, cognitive, depression L. Referrals and appointments - none  In addition, I have reviewed and discussed with patient certain preventive protocols, quality metrics, and best practice recommendations. A written personalized care plan for preventive services as well as general preventive health recommendations were provided to patient.  See attached scanned questionnaire for additional information.   Signed,   Tyson Dense, RN Nurse Health Advisor  Patient Concerns: Mole on back that he would like removed

## 2018-05-23 LAB — LIPID PANEL
Cholesterol: 112 mg/dL (ref <200)
HDL: 33 mg/dL — ABNORMAL LOW (ref >40)
LDL Cholesterol (Calc): 56 mg/dL (calc)
Non-HDL Cholesterol (Calc): 79 mg/dL (calc) (ref <130)
Total CHOL/HDL Ratio: 3.4 (calc) (ref <5.0)
Triglycerides: 148 mg/dL (ref <150)

## 2018-05-23 LAB — CBC WITH DIFFERENTIAL/PLATELET
Basophils Absolute: 29 cells/uL (ref 0–200)
Basophils Relative: 0.4 %
Eosinophils Absolute: 101 cells/uL (ref 15–500)
Eosinophils Relative: 1.4 %
HCT: 37.8 % — ABNORMAL LOW (ref 38.5–50.0)
Hemoglobin: 12.9 g/dL — ABNORMAL LOW (ref 13.2–17.1)
Lymphs Abs: 1469 cells/uL (ref 850–3900)
MCH: 29.8 pg (ref 27.0–33.0)
MCHC: 34.1 g/dL (ref 32.0–36.0)
MCV: 87.3 fL (ref 80.0–100.0)
MPV: 10.7 fL (ref 7.5–12.5)
Monocytes Relative: 9.5 %
Neutro Abs: 4918 cells/uL (ref 1500–7800)
Neutrophils Relative %: 68.3 %
Platelets: 163 10*3/uL (ref 140–400)
RBC: 4.33 10*6/uL (ref 4.20–5.80)
RDW: 12.9 % (ref 11.0–15.0)
Total Lymphocyte: 20.4 %
WBC mixed population: 684 cells/uL (ref 200–950)
WBC: 7.2 10*3/uL (ref 3.8–10.8)

## 2018-05-23 LAB — BASIC METABOLIC PANEL
BUN/Creatinine Ratio: 19 (calc) (ref 6–22)
BUN: 24 mg/dL (ref 7–25)
CO2: 29 mmol/L (ref 20–32)
Calcium: 9.4 mg/dL (ref 8.6–10.3)
Chloride: 103 mmol/L (ref 98–110)
Creat: 1.28 mg/dL — ABNORMAL HIGH (ref 0.70–1.18)
Glucose, Bld: 100 mg/dL — ABNORMAL HIGH (ref 65–99)
Potassium: 4.8 mmol/L (ref 3.5–5.3)
Sodium: 139 mmol/L (ref 135–146)

## 2018-05-23 LAB — HEMOGLOBIN A1C
Hgb A1c MFr Bld: 6.7 % of total Hgb — ABNORMAL HIGH (ref <5.7)
Mean Plasma Glucose: 146 (calc)
eAG (mmol/L): 8.1 (calc)

## 2018-05-24 ENCOUNTER — Encounter: Payer: Self-pay | Admitting: Internal Medicine

## 2018-05-24 ENCOUNTER — Ambulatory Visit (INDEPENDENT_AMBULATORY_CARE_PROVIDER_SITE_OTHER): Payer: Medicare Other | Admitting: Internal Medicine

## 2018-05-24 VITALS — BP 128/88 | HR 63 | Temp 97.8°F | Ht 65.0 in | Wt 206.0 lb

## 2018-05-24 DIAGNOSIS — H6123 Impacted cerumen, bilateral: Secondary | ICD-10-CM | POA: Diagnosis not present

## 2018-05-24 DIAGNOSIS — E1169 Type 2 diabetes mellitus with other specified complication: Secondary | ICD-10-CM | POA: Diagnosis not present

## 2018-05-24 DIAGNOSIS — E1122 Type 2 diabetes mellitus with diabetic chronic kidney disease: Secondary | ICD-10-CM

## 2018-05-24 DIAGNOSIS — D500 Iron deficiency anemia secondary to blood loss (chronic): Secondary | ICD-10-CM

## 2018-05-24 DIAGNOSIS — E785 Hyperlipidemia, unspecified: Secondary | ICD-10-CM

## 2018-05-24 DIAGNOSIS — R238 Other skin changes: Secondary | ICD-10-CM | POA: Diagnosis not present

## 2018-05-24 DIAGNOSIS — N182 Chronic kidney disease, stage 2 (mild): Secondary | ICD-10-CM

## 2018-05-24 DIAGNOSIS — I10 Essential (primary) hypertension: Secondary | ICD-10-CM

## 2018-05-24 NOTE — Progress Notes (Signed)
Location:   [facility]   Place of Service:   clinic  Provider: Annessa Satre L. Mariea Clonts, D.O., C.M.D.  Goals of Care:  Advanced Directives 05/24/2018  Does Patient Have a Medical Advance Directive? Yes  Type of Advance Directive [place]  Does patient want to make changes to medical advance directive? No - Patient declined  Copy of [place] in Chart? Yes   Chief Complaint  Patient presents with  . Medical Management of Chronic Issues    57mth follow-up, discuss labs    HPI: Patient is a 77 y.o. male seen today for medical management of chronic diseases.    He is doing pretty good.    He has a stuffy nose for several weeks.  Has not felt sick, just stuffy.  Some cough.  No sore throat.  Discussed allergies.    He has a mole in the center of his back.  It's annoying to him.  He wants it removed.  Sugars are 125-137 range.  Discussed being careful about sweets and pasta which drive his sugar up. Eats vegetables, meat, pasta.  hba1c trending up.  6.7 vs 6.5 (and before that 6.1)  BP is at upper limit of normal after he hurried in this morning.  He's on a full tablet of lisinopril 5mg .  Recheck 128/88.  Reports it fluctuates.  No dark stools or blood in stools.  No pain with bms.  Having good bms.    Urinates 3-4 times a day.  Gets up at night just once--has to have a bm then also.  No urinary complaints.   Past Medical History:  Diagnosis Date  . Arthritis   . Chronic kidney disease    CKD- Stage  II  . Colon cancer ([place])    2014  . Colon polyps    adenomatous  . Diabetes mellitus without complication ([place])   . Eczema   . Hypercholesteremia   . Hypertension     Past Surgical History:  Procedure Laterality Date  . COLON SURGERY  2014 & 2015   resection of colon & removal of poylp ; Dr Kathleene Hazel, Dr Sherlene Shams  . COLONOSCOPY  09/29/2013  . COLONOSCOPY WITH PROPOFOL N/A 03/10/2015   Procedure: COLONOSCOPY WITH PROPOFOL;  Surgeon: Inda Castle, MD;  Location: [facility];  Service: Endoscopy;  Laterality: N/A;  . EXTERNAL EAR SURGERY  1954  . FLEXIBLE SIGMOIDOSCOPY N/A 07/21/2014   Procedure: FLEXIBLE SIGMOIDOSCOPY;  Surgeon: Inda Castle, MD;  Location: [facility];  Service: Endoscopy;  Laterality: N/A;    No Known Allergies  Outpatient Encounter Medications as of 05/24/2018  Medication Sig  . atorvastatin (LIPITOR) 80 MG tablet Take 1 tablet (80 mg total) by mouth daily.  . ferrous sulfate 325 (65 FE) MG tablet Take 325 mg by mouth daily.  . finasteride (PROSCAR) 5 MG tablet TAKE ONE TABLET BY MOUTH DAILY  . glipiZIDE (GLUCOTROL XL) 5 MG 24 hr tablet TAKE ONE TABLET BY MOUTH EVERY MORNING WITH BREAKFAST  . glucose blood (ONE TOUCH ULTRA TEST) test strip USE TO CHECK BLOOD SUGARS ONCE DAILY AS DIRECTED FOR DIABETES  dxE11.8  . lisinopril (PRINIVIL,ZESTRIL) 10 MG tablet TAKE ONE-HALF TABLET BY MOUTH DAILY  . metFORMIN (GLUCOPHAGE) 1000 MG tablet TAKE 1 TABLET BY MOUTH 2 TIMES DAILY WITH A MEAL.  . saxagliptin HCl (ONGLYZA) 5 MG TABS tablet TAKE ONE TABLET BY MOUTH DAILY WITH LARGEST MEAL TO CONTROL BLOOD SUGAR  . tamsulosin (FLOMAX) 0.4 MG CAPS  capsule Take 0.4 mg by mouth daily.   . [DISCONTINUED] clotrimazole-betamethasone (LOTRISONE) cream Apply 1 application topically 2 (two) times daily. To itchy areas on shoulder blades and sides  . [DISCONTINUED] lisinopril (PRINIVIL,ZESTRIL) 10 MG tablet Take 10 mg by mouth daily.   No facility-administered encounter medications on file as of 05/24/2018.     Review of Systems:  Review of Systems  Constitutional: Negative for chills, fever and malaise/fatigue.  HENT: Positive for congestion and hearing loss.   Eyes: Negative for blurred vision.  Respiratory: Positive for cough. Negative for shortness of breath.   Cardiovascular: Negative for chest pain and palpitations.  Gastrointestinal: Negative for abdominal pain, blood in stool, constipation, diarrhea and melena.    Genitourinary: Negative for dysuria.  Musculoskeletal: Negative for falls and joint pain.  Skin: Negative for itching and rash.  Neurological: Negative for dizziness and loss of consciousness.  Endo/Heme/Allergies: Does not bruise/bleed easily.  Psychiatric/Behavioral: Negative for depression and memory loss. The patient is not nervous/anxious and does not have insomnia.     Health Maintenance  Topic Date Due  . OPHTHALMOLOGY EXAM  11/17/2017  . HEMOGLOBIN A1C  11/21/2018  . FOOT EXAM  01/09/2019  . TETANUS/TDAP  02/08/2023  . INFLUENZA VACCINE  Completed  . PNA vac Low Risk Adult  Completed    Physical Exam: Vitals:   05/24/18 0837  BP: 140/80  Pulse: 63  Temp: 97.8 F (36.6 C)  TempSrc: Oral  SpO2: 94%  Weight: 206 lb (93.4 kg)  Height: 5\' 5"  (1.651 m)   Body mass index is 34.28 kg/m. Physical Exam  Constitutional: He is oriented to person, place, and time. He appears well-developed and well-nourished. No distress.  HENT:  Head: Normocephalic and atraumatic.  HOH, but improved after flushing a couple of weeks ago  Eyes:  glasses  Cardiovascular: Normal rate, regular rhythm, normal heart sounds and intact distal pulses.  Pulmonary/Chest: Effort normal and breath sounds normal. No respiratory distress.  Abdominal: Bowel sounds are normal.  Musculoskeletal: Normal range of motion. He exhibits no tenderness.  Neurological: He is alert and oriented to person, place, and time.  Skin: Skin is warm and dry.  Raised papule central back, pink in color, nontender, but itchy and irritating to pt  Psychiatric: He has a normal mood and affect.    Labs reviewed: Basic Metabolic Panel: Recent Labs    08/24/17 0812 12/21/17 0807 05/22/18 0802  NA 139 138 139  K 4.9 4.8 4.8  CL 105 105 103  CO2 27 25 29   GLUCOSE 92 101* 100*  BUN 24 26* 24  CREATININE 1.48* 1.33* 1.28*  CALCIUM 9.3 9.0 9.4   Liver Function Tests: No results for input(s): AST, ALT, ALKPHOS, BILITOT,  PROT, ALBUMIN in the last 8760 hours. No results for input(s): LIPASE, AMYLASE in the last 8760 hours. No results for input(s): AMMONIA in the last 8760 hours. CBC: Recent Labs    08/24/17 0812 12/21/17 0807 05/22/18 0802  WBC 6.2 7.0 7.2  NEUTROABS 4,049 4,711 4,918  HGB 13.5 13.5 12.9*  HCT 40.2 39.3 37.8*  MCV 86.5 85.2 87.3  PLT 170 174 163   Lipid Panel: Recent Labs    08/24/17 0812 12/21/17 0807 05/22/18 0802  CHOL 147 150 112  HDL 51 42 33*  LDLCALC 75 85 56  TRIG 127 124 148  CHOLHDL 2.9 3.6 3.4   Lab Results  Component Value Date   HGBA1C 6.7 (H) 05/22/2018   Assessment/Plan 1. Benign papule -  present midback and pt wants removed--we no longer have cryo capability - Ambulatory referral to Dermatology  2. Bilateral hearing loss due to cerumen impaction -better today, cerumen not currently impacted  3. Hyperlipidemia associated with type 2 diabetes mellitus (HCC) -lipids trended up, encouraged improved dietary choices   4. Type 2 diabetes mellitus with stage 2 chronic kidney disease, without long-term current use of insulin (HCC) -reduce starchy foods -cont same diabetes meds -recommended battery change to see if machine works better for him--if not, he may need to get a new machine b/c it's not always giving him readings  5. Essential hypertension, benign -bp at goal after he relaxed for visit, cont lisinopril 5mg  daily  6. Iron deficiency anemia due to chronic blood loss -h/h has been stable, continue iron  Labs/tests ordered:   Orders Placed This Encounter  Procedures  . Ambulatory referral to Dermatology    Referral Priority:   Routine    Referral Type:   Consultation    Referral Reason:   Specialty Services Required    Requested Specialty:   Dermatology    Number of Visits Requested:   1    Next appt:  09/27/2018 med mgt, fasting labs before   [place]. Kadeja Granada, D.O. [facility] [address], [place] [address] Cell Phone (Mon-Fri 8am-5pm):  [phone] On Call:  [phone] & follow prompts after 5pm & weekends Office Phone:  [phone] Office Fax:  [phone]

## 2018-06-04 ENCOUNTER — Other Ambulatory Visit: Payer: Self-pay | Admitting: Internal Medicine

## 2018-07-08 ENCOUNTER — Other Ambulatory Visit: Payer: Self-pay | Admitting: Internal Medicine

## 2018-07-15 ENCOUNTER — Other Ambulatory Visit: Payer: Self-pay | Admitting: Internal Medicine

## 2018-07-23 ENCOUNTER — Telehealth: Payer: Self-pay | Admitting: *Deleted

## 2018-07-23 MED ORDER — LINAGLIPTIN 5 MG PO TABS: 5.0000 mg | ORAL_TABLET | Freq: Every day | ORAL | 3 refills | Status: DC

## 2018-07-23 NOTE — Telephone Encounter (Signed)
Patient called and stated that he received a letter in the mail from the insurance stating they will no longer cover his Onglyza starting 08/01/2018. The alternatives is Januvia and Tradjenta. Please Advise.

## 2018-07-23 NOTE — Telephone Encounter (Signed)
[facility] for Justin [name] [phone] to return call.

## 2018-07-23 NOTE — Telephone Encounter (Signed)
Mardene Celeste notified and agreed. Faxed Rx per request

## 2018-07-23 NOTE — Telephone Encounter (Signed)
Prefer tradjenta 5mg  daily.  We can send in for January.

## 2018-08-14 ENCOUNTER — Ambulatory Visit (INDEPENDENT_AMBULATORY_CARE_PROVIDER_SITE_OTHER): Payer: Medicare Other | Admitting: Family

## 2018-08-14 ENCOUNTER — Encounter: Payer: Self-pay | Admitting: Family

## 2018-08-14 VITALS — BP 122/70 | HR 71 | Temp 97.8°F | Ht 65.0 in | Wt 189.6 lb

## 2018-08-14 DIAGNOSIS — H6123 Impacted cerumen, bilateral: Secondary | ICD-10-CM

## 2018-08-14 NOTE — Progress Notes (Signed)
Provider:   FNP-C  Gayland Curry, DO  Patient Care Team: Gayland Curry, DO as PCP - General (Geriatric Medicine)  Extended Emergency Contact Information Primary Emergency Contact: Debeer,Patricia Address: [address]          [place], [place] [address] Johnnette Litter of [place] Phone: [phone] Relation: Sister   Goals of care: Advanced Directive information Advanced Directives 08/14/2018  Does Patient Have a Medical Advance Directive? Yes  Type of Advance Directive [place]  Does patient want to make changes to medical advance directive? No - Patient declined  Copy of [place] in Chart? Yes - validated most recent copy scanned in chart (See row information)     Chief Complaint  Patient presents with  . Acute Visit    Impacted ears     HPI:  Pt is a 78 y.o. male seen today for an acute visit for evaluation of bilateral cerumen impaction.He denies any pain,ringing or discharge from the ears.Also denies any fever or signs and symptoms of upper respiratory tract infections.   Past Medical History:  Diagnosis Date  . Arthritis   . Chronic kidney disease    CKD- Stage  II  . Colon cancer ([place])    2014  . Colon polyps    adenomatous  . Diabetes mellitus without complication ([place])   . Eczema   . Hypercholesteremia   . Hypertension    Past Surgical History:  Procedure Laterality Date  . COLON SURGERY  2014 & 2015   resection of colon & removal of poylp ; Dr Kathleene Hazel, Dr Sherlene Shams  . COLONOSCOPY  09/29/2013  . COLONOSCOPY WITH PROPOFOL N/A 03/10/2015   Procedure: COLONOSCOPY WITH PROPOFOL;  Surgeon: Inda Castle, MD;  Location: [facility];  Service: Endoscopy;  Laterality: N/A;  . EXTERNAL EAR SURGERY  1954  . FLEXIBLE SIGMOIDOSCOPY N/A 07/21/2014   Procedure: FLEXIBLE SIGMOIDOSCOPY;  Surgeon: Inda Castle, MD;  Location: [facility];  Service: Endoscopy;  Laterality: N/A;    No Known  Allergies  Outpatient Encounter Medications as of 08/14/2018  Medication Sig  . atorvastatin (LIPITOR) 80 MG tablet Take 1 tablet (80 mg total) by mouth daily.  . ferrous sulfate 325 (65 FE) MG tablet Take 325 mg by mouth daily.  . finasteride (PROSCAR) 5 MG tablet TAKE ONE TABLET BY MOUTH DAILY  . glipiZIDE (GLUCOTROL XL) 5 MG 24 hr tablet TAKE ONE TABLET BY MOUTH EVERY MORNING WITH BREAKFAST  . glucose blood (ONE TOUCH ULTRA TEST) test strip USE TO CHECK BLOOD SUGARS ONCE DAILY AS DIRECTED FOR DIABETES  dxE11.8  . linagliptin (TRADJENTA) 5 MG TABS tablet Take 1 tablet (5 mg total) by mouth daily.  Marland Kitchen lisinopril (PRINIVIL,ZESTRIL) 10 MG tablet TAKE ONE-HALF TABLET BY MOUTH DAILY  . lisinopril (PRINIVIL,ZESTRIL) 5 MG tablet TAKE ONE TABLET BY MOUTH DAILY  . metFORMIN (GLUCOPHAGE) 1000 MG tablet TAKE 1 TABLET BY MOUTH 2 TIMES DAILY WITH A MEAL.  . tamsulosin (FLOMAX) 0.4 MG CAPS capsule Take 0.4 mg by mouth daily.    No facility-administered encounter medications on file as of 08/14/2018.     Review of Systems  Constitutional: Negative for chills and fever.  HENT: Positive for hearing loss. Negative for congestion, ear discharge, ear pain, rhinorrhea, sinus pressure, sinus pain, sneezing, sore throat and tinnitus.   Eyes: Negative for discharge, redness and itching.  Respiratory: Negative for cough, shortness of breath and wheezing.   Cardiovascular: Negative for chest pain, palpitations and  leg swelling.  Neurological: Negative for dizziness, light-headedness and headaches.    Immunization History  Administered Date(s) Administered  . Influenza, High Dose Seasonal PF 04/27/2017, 04/30/2018  . Influenza,inj,Quad PF,6+ Mos 05/14/2015, 06/03/2016  . Influenza-Unspecified 05/26/2011, 08/15/2012, 05/01/2013  . Pneumococcal Conjugate-13 08/13/2015  . Pneumococcal Polysaccharide-23 03/12/2009  . Tdap 02/07/2013  . Zoster 01/03/2016   Pertinent  Health Maintenance Due  Topic Date Due  .  OPHTHALMOLOGY EXAM  11/17/2017  . HEMOGLOBIN A1C  11/21/2018  . FOOT EXAM  01/09/2019  . INFLUENZA VACCINE  Completed  . PNA vac Low Risk Adult  Completed   Fall Risk  05/22/2018 04/30/2018 11/23/2017 08/28/2017 07/27/2017  Falls in the past year? No No No No No  Number falls in past yr: - - - - -  Comment - - - - -  Injury with Fall? - - - - -  Comment - - - - -    Vitals:   08/14/18 0817  BP: 122/70  Pulse: 71  Temp: 97.8 F (36.6 C)  TempSrc: Oral  SpO2: 91%  Weight: 189 lb 9.6 oz (86 kg)  Height: 5\' 5"  (1.651 m)   Body mass index is 31.55 kg/m. Physical Exam Vitals signs reviewed.  Constitutional:      General: He is not in acute distress.    Appearance: He is obese. He is not ill-appearing.  HENT:     Head: Normocephalic.     Right Ear: There is impacted cerumen.     Left Ear: There is impacted cerumen.     Ears:     Comments: Bilateral ear cerumen lavaged with warm water.Left ear large amounts of cerumen removed with alligator forceps.bilateral TM clear without any signs of infections.Patient tolerated procedure well.     Nose: Nose normal. No congestion or rhinorrhea.     Mouth/Throat:     Mouth: Mucous membranes are moist.     Pharynx: Oropharynx is clear. No oropharyngeal exudate or posterior oropharyngeal erythema.  Eyes:     General: No scleral icterus.       Right eye: No discharge.        Left eye: No discharge.     Conjunctiva/sclera: Conjunctivae normal.     Pupils: Pupils are equal, round, and reactive to light.  Neck:     Musculoskeletal: Normal range of motion. No muscular tenderness.  Lymphadenopathy:     Cervical: No cervical adenopathy.  Skin:    General: Skin is warm and dry.     Findings: No erythema.  Neurological:     Mental Status: He is alert and oriented to person, place, and time.  Psychiatric:        Mood and Affect: Mood normal.        Behavior: Behavior normal.     Labs reviewed: Recent Labs    08/24/17 0812 12/21/17 0807  05/22/18 0802  NA 139 138 139  K 4.9 4.8 4.8  CL 105 105 103  CO2 27 25 29   GLUCOSE 92 101* 100*  BUN 24 26* 24  CREATININE 1.48* 1.33* 1.28*  CALCIUM 9.3 9.0 9.4    Recent Labs    08/24/17 0812 12/21/17 0807 05/22/18 0802  WBC 6.2 7.0 7.2  NEUTROABS 4,049 4,711 4,918  HGB 13.5 13.5 12.9*  HCT 40.2 39.3 37.8*  MCV 86.5 85.2 87.3  PLT 170 174 163   No results found for: TSH Lab Results  Component Value Date   HGBA1C 6.7 (H) 05/22/2018   Lab  Results  Component Value Date   CHOL 112 05/22/2018   HDL 33 (L) 05/22/2018   LDLCALC 56 05/22/2018   TRIG 148 05/22/2018   CHOLHDL 3.4 05/22/2018    Significant Diagnostic Results in last 30 days:  No results found.  Assessment/Plan   Bilateral impacted cerumen Bilateral ear lavaged with warm water large amounts of cerumen obtain from left ear removed with alligator forceps.patient tolerated procedure well.No bleeding or pain reported.Notify provider for any pain in the ears or fever.   Family/ staff Communication: Reviewed plan of care with patient.  Labs/tests ordered: None    C , NP

## 2018-08-14 NOTE — Patient Instructions (Signed)
Notify provider for any pain in the ears or fever.

## 2018-08-20 ENCOUNTER — Telehealth: Payer: Self-pay

## 2018-08-20 NOTE — Telephone Encounter (Signed)
Incoming fax received from [facility] indicating Onglyza is no longer on patient's formulary. [facility] is preferred alternative. Please send new rx for patient can not afford Onglyza anymore  After reviewing record it appears that rx for Tradjenta was sent to pharmacy on July 23, 2018 and it was noted that [place] would no longer be covered starting in 2020.  I called [facility] and left message informing them to check records for Tradjenta rx sent on 07/23/2018, I provided required refill details in the event that rx was not received

## 2018-09-04 ENCOUNTER — Other Ambulatory Visit: Payer: Self-pay | Admitting: *Deleted

## 2018-09-04 MED ORDER — TAMSULOSIN HCL 0.4 MG PO CAPS: 0.4000 mg | ORAL_CAPSULE | Freq: Every day | ORAL | 0 refills | Status: DC

## 2018-09-04 NOTE — Telephone Encounter (Signed)
[facility] requested refill.  

## 2018-09-07 ENCOUNTER — Other Ambulatory Visit: Payer: Self-pay | Admitting: Internal Medicine

## 2018-09-12 ENCOUNTER — Emergency Department (HOSPITAL_BASED_OUTPATIENT_CLINIC_OR_DEPARTMENT_OTHER)
Admission: EM | Admit: 2018-09-12 | Discharge: 2018-09-13 | Disposition: A | Discharge status: Home / Self Care | Payer: Medicare Other | Attending: Emergency Medicine | Admitting: Emergency Medicine

## 2018-09-12 ENCOUNTER — Emergency Department (HOSPITAL_BASED_OUTPATIENT_CLINIC_OR_DEPARTMENT_OTHER): Payer: Medicare Other

## 2018-09-12 ENCOUNTER — Other Ambulatory Visit: Payer: Self-pay

## 2018-09-12 ENCOUNTER — Encounter (HOSPITAL_BASED_OUTPATIENT_CLINIC_OR_DEPARTMENT_OTHER): Payer: Self-pay

## 2018-09-12 DIAGNOSIS — R112 Nausea with vomiting, unspecified: Secondary | ICD-10-CM | POA: Diagnosis not present

## 2018-09-12 DIAGNOSIS — E1122 Type 2 diabetes mellitus with diabetic chronic kidney disease: Secondary | ICD-10-CM | POA: Insufficient documentation

## 2018-09-12 DIAGNOSIS — I129 Hypertensive chronic kidney disease with stage 1 through stage 4 chronic kidney disease, or unspecified chronic kidney disease: Secondary | ICD-10-CM | POA: Diagnosis not present

## 2018-09-12 DIAGNOSIS — N182 Chronic kidney disease, stage 2 (mild): Secondary | ICD-10-CM | POA: Insufficient documentation

## 2018-09-12 DIAGNOSIS — R42 Dizziness and giddiness: Secondary | ICD-10-CM | POA: Diagnosis not present

## 2018-09-12 DIAGNOSIS — R410 Disorientation, unspecified: Secondary | ICD-10-CM | POA: Diagnosis not present

## 2018-09-12 LAB — CBC
HCT: 42.7 % (ref 39.0–52.0)
Hemoglobin: 13.4 g/dL (ref 13.0–17.0)
MCH: 28.3 pg (ref 26.0–34.0)
MCHC: 31.4 g/dL (ref 30.0–36.0)
MCV: 90.3 fL (ref 80.0–100.0)
Platelets: 163 10*3/uL (ref 150–400)
RBC: 4.73 MIL/uL (ref 4.22–5.81)
RDW: 13.2 % (ref 11.5–15.5)
WBC: 13.1 10*3/uL — ABNORMAL HIGH (ref 4.0–10.5)
nRBC: 0 % (ref 0.0–0.2)

## 2018-09-12 LAB — BASIC METABOLIC PANEL
Anion gap: 8 (ref 5–15)
BUN: 32 mg/dL — ABNORMAL HIGH (ref 8–23)
CO2: 25 mmol/L (ref 22–32)
Calcium: 9.2 mg/dL (ref 8.9–10.3)
Chloride: 104 mmol/L (ref 98–111)
Creatinine, Ser: 1.29 mg/dL — ABNORMAL HIGH (ref 0.61–1.24)
GFR calc Af Amer: >60 mL/min (ref >60)
GFR calc non Af Amer: 53 mL/min — ABNORMAL LOW (ref >60)
GLUCOSE: 120 mg/dL — AB (ref 70–99)
Potassium: 4.5 mmol/L (ref 3.5–5.1)
Sodium: 137 mmol/L (ref 135–145)

## 2018-09-12 LAB — URINALYSIS, ROUTINE W REFLEX MICROSCOPIC
Bilirubin Urine: NEGATIVE
Glucose, UA: NEGATIVE mg/dL
Hgb urine dipstick: NEGATIVE
Ketones, ur: NEGATIVE mg/dL
Leukocytes,Ua: NEGATIVE
Nitrite: NEGATIVE
Protein, ur: NEGATIVE mg/dL
SPECIFIC GRAVITY, URINE: 1.02 (ref 1.005–1.030)
pH: 6 (ref 5.0–8.0)

## 2018-09-12 LAB — TROPONIN I: Troponin I: <0.03 ng/mL (ref <0.03)

## 2018-09-12 LAB — CBG MONITORING, ED: GLUCOSE-CAPILLARY: 100 mg/dL — AB (ref 70–99)

## 2018-09-12 MED ORDER — SODIUM CHLORIDE 0.9% FLUSH
3.0000 mL | Freq: Once | INTRAVENOUS | Status: DC
  Filled 2018-09-12: qty 3

## 2018-09-12 NOTE — ED Provider Notes (Signed)
Emergency Department Provider Note   I have reviewed the triage vital signs and the nursing notes.   HISTORY  Chief Complaint Dizziness   HPI DAMEAN POFFENBERGER is a 78 y.o. male with PMH of CKD, DM, HLD, and HTN presents to the ED for evaluation of acute onset dizziness and difficulty walking. Symptoms began at 3:30 PM while driving home from bowling.  The patient describes vertigo type symptoms and nausea with vomiting.  He returned home where his power of attorney, who is at the bedside, stated that he could not walk.  He was complaining of mild dizziness at that time and had 3 more episodes of vomiting.  No ringing in the ears.  Patient has had some nasal congestion but no earache or other symptoms.  The patient denies any speech change, difficulty swallowing, unilateral weakness/numbness.  No prior history of stroke.  They got in the car and drove to urgent care.  After approximately 45 minutes his symptoms have resolved.  He is no longer feeling dizzy and walked into urgent care without difficulty.  While at urgent care, the patient was referred immediately to the emergency department with concern for heart attack.  Patient is not experiencing any chest pain, palpitations, shortness of breath, or diaphoresis.  Past Medical History:  Diagnosis Date  . Arthritis   . Chronic kidney disease    CKD- Stage  II  . Colon cancer ([facility])    2014  . Colon polyps    adenomatous  . Diabetes mellitus without complication ([place])   . Eczema   . Hypercholesteremia   . Hypertension     Patient Active Problem List   Diagnosis Date Noted  . Mid back pain 03/25/2014  . Tubular adenoma of colon 03/25/2014  . CN (constipation) 03/25/2014  . Type 2 diabetes mellitus with stage 2 chronic kidney disease, without long-term current use of insulin ([place]) 03/25/2014  . Anemia, iron deficiency 03/25/2014  . Essential hypertension, benign 03/25/2014  . Hyperlipidemia 03/25/2014  . Personal history of colon  cancer, stage II 03/25/2014  . Prostate nodule without urinary obstruction 03/25/2014    Past Surgical History:  Procedure Laterality Date  . COLON SURGERY  2014 & 2015   resection of colon & removal of poylp ; Dr Kathleene Hazel, Dr Sherlene Shams  . COLONOSCOPY  09/29/2013  . COLONOSCOPY WITH PROPOFOL N/A 03/10/2015   Procedure: COLONOSCOPY WITH PROPOFOL;  Surgeon: Inda Castle, MD;  [facility];  Service: Endoscopy;  Laterality: N/A;  . EXTERNAL EAR SURGERY  1954  . FLEXIBLE SIGMOIDOSCOPY N/A 07/21/2014   Procedure: FLEXIBLE SIGMOIDOSCOPY;  Surgeon: Inda Castle, MD;  [facility];  Service: Endoscopy;  Laterality: N/A;    Allergies Patient has no known allergies.  Family History  Problem Relation Age of Onset  . Varicose Veins Mother   . Stroke Mother   . Glaucoma Mother   . Kidney disease Mother   . Hypertension Father   . Heart attack Father   . Cancer Brother        testicular cancer  . Hyperlipidemia Sister   . Meniere's disease Sister     Social History Social History   Tobacco Use  . Smoking status: Never Smoker  . Smokeless tobacco: Never Used  Substance Use Topics  . Alcohol use: Yes    Alcohol/week: 7.0 standard drinks    Types: 7 Cans of beer per week    Comment: daily  . Drug use: No  Review of Systems  Constitutional: No fever/chills Eyes: No visual changes. ENT: No sore throat. Positive vertigo.  Cardiovascular: Denies chest pain. Respiratory: Denies shortness of breath. Gastrointestinal: No abdominal pain. Positive nausea and vomiting.  No diarrhea.  No constipation. Genitourinary: Negative for dysuria. Musculoskeletal: Negative for back pain. Skin: Negative for rash. Neurological: Negative for headaches, focal weakness or numbness. Gait instability.   10-point ROS otherwise negative.  ____________________________________________   PHYSICAL EXAM:  VITAL SIGNS: ED Triage Vitals  Enc Vitals Group     BP 09/12/18 1905  (!) 149/89     Pulse Rate 09/12/18 1905 86     Resp 09/12/18 1905 (!) 22     Temp 09/12/18 1905 97.7 F (36.5 C)     Temp Source 09/12/18 1905 Oral     SpO2 09/12/18 1905 96 %     Weight 09/12/18 1904 190 lb (86.2 kg)     Height 09/12/18 1904 5\' 5"  (1.651 m)     Pain Score 09/12/18 1901 0   Constitutional: Alert and oriented. Well appearing and in no acute distress. Eyes: Conjunctivae are normal. PERRL. EOMI. Head: Atraumatic. Nose: No congestion/rhinnorhea. Mouth/Throat: Mucous membranes are moist.  Oropharynx non-erythematous. Neck: No stridor. Cardiovascular: Normal rate, regular rhythm. Good peripheral circulation. Grossly normal heart sounds.   Respiratory: Normal respiratory effort.  No retractions. Lungs CTAB. Gastrointestinal: Soft and nontender. No distention.  Musculoskeletal: No lower extremity tenderness nor edema. No gross deformities of extremities. Neurologic:  Normal speech and language. No gross focal neurologic deficits are appreciated. No pronator drift. Normal CN exam 2-12. Normal strength and sensation in the upper and lower extremities.  Skin:  Skin is warm, dry and intact. No rash noted.  ____________________________________________   LABS (all labs ordered are listed, but only abnormal results are displayed)  Labs Reviewed  BASIC METABOLIC PANEL - Abnormal; Notable for the following components:      Result Value   Glucose, Bld 120 (*)    BUN 32 (*)    Creatinine, Ser 1.29 (*)    GFR calc non Af Amer 53 (*)    All other components within normal limits  CBC - Abnormal; Notable for the following components:   WBC 13.1 (*)    All other components within normal limits  CBG MONITORING, ED - Abnormal; Notable for the following components:   Glucose-Capillary 100 (*)    All other components within normal limits  URINALYSIS, ROUTINE W REFLEX MICROSCOPIC  TROPONIN I   ____________________________________________  EKG   EKG  Interpretation  Date/Time:  Wednesday September 12 2018 19:03:08 EST Ventricular Rate:  82 PR Interval:    QRS Duration: 98 QT Interval:  353 QTC Calculation: 413 R Axis:   53 Text Interpretation:  Sinus rhythm No STEMI.  Confirmed by Nanda Quinton [phone]) on 09/12/2018 7:23:20 PM Also confirmed by Nanda Quinton [phone]), editor Philomena Doheny [phone])  on 09/13/2018 8:19:22 AM       ____________________________________________  RADIOLOGY  Ct Head Wo Contrast  Result Date: 09/12/2018 CLINICAL DATA:  Dizziness EXAM: CT HEAD WITHOUT CONTRAST TECHNIQUE: Contiguous axial images were obtained from the base of the skull through the vertex without intravenous contrast. COMPARISON:  None. FINDINGS: Brain: Mild age related volume loss. No acute intracranial abnormality. Specifically, no hemorrhage, hydrocephalus, mass lesion, acute infarction, or significant intracranial injury. Vascular: No hyperdense vessel or unexpected calcification. Skull: No acute calvarial abnormality. Sinuses/Orbits: Visualized paranasal sinuses and mastoids clear. Orbital soft tissues unremarkable. Other: None IMPRESSION: No acute intracranial  abnormality. Electronically Signed   By: Rolm Baptise M.D.   On: 09/12/2018 19:47   Mr Brain Wo Contrast  Result Date: 09/13/2018 CLINICAL DATA:  Dizziness, unsteady gait and vomiting EXAM: MRI HEAD WITHOUT CONTRAST TECHNIQUE: Multiplanar, multiecho pulse sequences of the brain and surrounding structures were obtained without intravenous contrast. COMPARISON:  Head CT 09/12/2018 FINDINGS: BRAIN: There is no acute infarct, acute hemorrhage, hydrocephalus or extra-axial collection. The midline structures are normal. No midline shift or other mass effect. Multifocal white matter hyperintensity, most commonly due to chronic ischemic microangiopathy. Generalized atrophy without lobar predilection. Susceptibility-sensitive sequences show no chronic microhemorrhage or superficial siderosis. VASCULAR:  Major intracranial arterial and venous sinus flow voids are normal. SKULL AND UPPER CERVICAL SPINE: Calvarial bone marrow signal is normal. There is no skull base mass. Visualized upper cervical spine and soft tissues are normal. SINUSES/ORBITS: No fluid levels or advanced mucosal thickening. No mastoid or middle ear effusion. The orbits are normal. IMPRESSION: Atrophy and chronic microvascular ischemia without acute abnormality. Electronically Signed   By: Ulyses Jarred M.D.   On: 09/13/2018 05:15    ____________________________________________   PROCEDURES  Procedure(s) performed:   Procedures  None ____________________________________________   INITIAL IMPRESSION / ASSESSMENT AND PLAN / ED COURSE  Pertinent labs & imaging results that were available during my care of the patient were reviewed by me and considered in my medical decision making (see chart for details).  She presents to the emergency department for evaluation of nausea, vomiting, vertigo, and gait instability.  Symptoms began abruptly at 3:30 PM while driving and resolved approximately 45 minutes later.  Patient is neurologically intact.  He is not exhibiting any vertigo symptoms with head movement.  No tinnitus.  My suspicion for central vertigo/TIA is somewhat elevated.  Plan for CT imaging of the head along with screening lab work.  EKG with no ischemic changes.  Troponin pending.  No specific ACS findings or symptoms.   CT and labs normal. Discussed with Dr. Malen Gauze. Plan for ED-ED transfer for MRI and admit if positive for CVA. Dr. Tyrone Nine is the accepting EDP.  ____________________________________________  FINAL CLINICAL IMPRESSION(S) / ED DIAGNOSES  Final diagnoses:  Dizziness     MEDICATIONS GIVEN DURING THIS VISIT:  Medications  sodium chloride flush (NS) 0.9 % injection 3 mL (has no administration in time range)     NEW OUTPATIENT MEDICATIONS STARTED DURING THIS VISIT:  Discharge Medication List as of  09/13/2018  6:01 AM    START taking these medications   Details  meclizine (ANTIVERT) 12.5 MG tablet Take 1 tablet (12.5 mg total) by mouth 3 (three) times daily as needed for dizziness., Starting Thu 09/13/2018, Normal        Note:  This document was prepared using Dragon voice recognition software and may include unintentional dictation errors.  Nanda Quinton, MD Emergency Medicine    Long, [facility], MD 09/13/18 [phone]

## 2018-09-12 NOTE — ED Notes (Signed)
Pt on monitor 

## 2018-09-12 NOTE — ED Triage Notes (Signed)
Per pt and sister, POA due to "special needs"-pt with onset dizziness while driving ~201EO-FH drove ~20 min-had unsteady gait, vomited x 3-pt taken to tx area w/c upon arrival-pt denies pain

## 2018-09-12 NOTE — ED Notes (Signed)
ED Provider at bedside. 

## 2018-09-12 NOTE — ED Notes (Signed)
C/o dizziness onset around 1530 this pm x 20 minutes  Dizziness pain  Pt has diff hearing

## 2018-09-12 NOTE — Discharge Instructions (Addendum)
Your work-up in the emergency department today has been reassuring.  Your MRI does not show any evidence of stroke.  Your dizziness is likely caused by peripheral vertigo which should resolve on its own eventually.  You may take meclizine as prescribed for management of any persistent dizziness.  Be cautious as this medication may make you sleepy/drowsy.  Follow-up with your primary care doctor to ensure that symptoms resolve.

## 2018-09-12 NOTE — ED Provider Notes (Signed)
11:55 PM Patient care assumed in transfer for further evaluation of dizziness.  Seen initially by MD long at [facility].  Initial evaluation has been reassuring.  Plan for MRI.  If MRI without acute or emergent process, patient stable for discharge and outpatient follow-up.  Neurology service previously consulted.  Patient stable at this time with no complaints.  Pleasant, in no distress.  4:40 AM Patient transported to MRI.  5:48 AM MRI without evidence of acute change or CVA. Likely peripheral vertigo. Will discharge with 12.5mg  Meclizine to take PRN. Encouraged PCP follow up.  Return precautions discussed and provided. Patient discharged in stable condition with no unaddressed concerns.   Vitals:   09/12/18 2100 09/12/18 2130 09/12/18 2302 09/13/18 0020  BP: 118/68 116/85 132/75 128/72  Pulse: 68 71 68 71  Resp:   18 16  Temp:   (!) 97.4 F (36.3 C) 97.7 F (36.5 C)  TempSrc:   Oral Oral  SpO2: 94% 93% 97% 95%  Weight:      Height:        Results for orders placed or performed during the hospital encounter of 26/37/85  Basic metabolic panel  Result Value Ref Range   Sodium 137 135 - 145 mmol/L   Potassium 4.5 3.5 - 5.1 mmol/L   Chloride 104 98 - 111 mmol/L   CO2 25 22 - 32 mmol/L   Glucose, Bld 120 (H) 70 - 99 mg/dL   BUN 32 (H) 8 - 23 mg/dL   Creatinine, Ser 1.29 (H) 0.61 - 1.24 mg/dL   Calcium 9.2 8.9 - 10.3 mg/dL   GFR calc non Af Amer 53 (L) >60 mL/min   GFR calc Af Amer >60 >60 mL/min   Anion gap 8 5 - 15  CBC  Result Value Ref Range   WBC 13.1 (H) 4.0 - 10.5 K/uL   RBC 4.73 4.22 - 5.81 MIL/uL   Hemoglobin 13.4 13.0 - 17.0 g/dL   HCT 42.7 39.0 - 52.0 %   MCV 90.3 80.0 - 100.0 fL   MCH 28.3 26.0 - 34.0 pg   MCHC 31.4 30.0 - 36.0 g/dL   RDW 13.2 11.5 - 15.5 %   Platelets 163 150 - 400 K/uL   nRBC 0.0 0.0 - 0.2 %  Urinalysis, Routine w reflex microscopic  Result Value Ref Range   Color, Urine YELLOW YELLOW   APPearance CLEAR CLEAR   Specific  Gravity, Urine 1.020 1.005 - 1.030   pH 6.0 5.0 - 8.0   Glucose, UA NEGATIVE NEGATIVE mg/dL   Hgb urine dipstick NEGATIVE NEGATIVE   Bilirubin Urine NEGATIVE NEGATIVE   Ketones, ur NEGATIVE NEGATIVE mg/dL   Protein, ur NEGATIVE NEGATIVE mg/dL   Nitrite NEGATIVE NEGATIVE   Leukocytes,Ua NEGATIVE NEGATIVE  Troponin I - ONCE - STAT  Result Value Ref Range   Troponin I <0.03 <0.03 ng/mL  POC CBG, ED  Result Value Ref Range   Glucose-Capillary 100 (H) 70 - 99 mg/dL   Ct Head Wo Contrast  Result Date: 09/12/2018 CLINICAL DATA:  Dizziness EXAM: CT HEAD WITHOUT CONTRAST TECHNIQUE: Contiguous axial images were obtained from the base of the skull through the vertex without intravenous contrast. COMPARISON:  None. FINDINGS: Brain: Mild age related volume loss. No acute intracranial abnormality. Specifically, no hemorrhage, hydrocephalus, mass lesion, acute infarction, or significant intracranial injury. Vascular: No hyperdense vessel or unexpected calcification. Skull: No acute calvarial abnormality. Sinuses/Orbits: Visualized paranasal sinuses and mastoids clear. Orbital soft tissues unremarkable. Other: None IMPRESSION:  No acute intracranial abnormality. Electronically Signed   By: Rolm Baptise M.D.   On: 09/12/2018 19:47   Mr Brain Wo Contrast  Result Date: 09/13/2018 CLINICAL DATA:  Dizziness, unsteady gait and vomiting EXAM: MRI HEAD WITHOUT CONTRAST TECHNIQUE: Multiplanar, multiecho pulse sequences of the brain and surrounding structures were obtained without intravenous contrast. COMPARISON:  Head CT 09/12/2018 FINDINGS: BRAIN: There is no acute infarct, acute hemorrhage, hydrocephalus or extra-axial collection. The midline structures are normal. No midline shift or other mass effect. Multifocal white matter hyperintensity, most commonly due to chronic ischemic microangiopathy. Generalized atrophy without lobar predilection. Susceptibility-sensitive sequences show no chronic microhemorrhage or  superficial siderosis. VASCULAR: Major intracranial arterial and venous sinus flow voids are normal. SKULL AND UPPER CERVICAL SPINE: Calvarial bone marrow signal is normal. There is no skull base mass. Visualized upper cervical spine and soft tissues are normal. SINUSES/ORBITS: No fluid levels or advanced mucosal thickening. No mastoid or middle ear effusion. The orbits are normal. IMPRESSION: Atrophy and chronic microvascular ischemia without acute abnormality. Electronically Signed   By: Ulyses Jarred M.D.   On: 09/13/2018 05:15      Antonietta Breach, PA-C 09/13/18 0601    Ezequiel Essex, MD 09/13/18 0830

## 2018-09-13 ENCOUNTER — Telehealth: Payer: Self-pay | Admitting: *Deleted

## 2018-09-13 ENCOUNTER — Emergency Department (HOSPITAL_COMMUNITY): Payer: Medicare Other

## 2018-09-13 DIAGNOSIS — R42 Dizziness and giddiness: Secondary | ICD-10-CM | POA: Diagnosis not present

## 2018-09-13 MED ORDER — MECLIZINE HCL 12.5 MG PO TABS: 12.5000 mg | ORAL_TABLET | Freq: Three times a day (TID) | ORAL | 0 refills | Status: DC | PRN

## 2018-09-13 NOTE — ED Notes (Signed)
Patient verbalizes understanding of medications and discharge instructions. No further questions at this time. VSS and patient ambulatory at discharge.   

## 2018-09-13 NOTE — ED Notes (Signed)
Debbie called- sending someone to get the pt for MRI

## 2018-09-13 NOTE — Telephone Encounter (Signed)
Sister calling asking for patient to be seen after ED visit, appt scheduled with Janett Billow

## 2018-09-13 NOTE — ED Notes (Signed)
Called MRI for update and informed patient.

## 2018-09-14 ENCOUNTER — Ambulatory Visit (INDEPENDENT_AMBULATORY_CARE_PROVIDER_SITE_OTHER): Payer: Medicare Other | Admitting: Nurse Practitioner

## 2018-09-14 ENCOUNTER — Encounter: Payer: Self-pay | Admitting: Nurse Practitioner

## 2018-09-14 VITALS — BP 120/70 | HR 74 | Temp 97.7°F | Ht 65.0 in | Wt 200.0 lb

## 2018-09-14 DIAGNOSIS — D72829 Elevated white blood cell count, unspecified: Secondary | ICD-10-CM

## 2018-09-14 DIAGNOSIS — N182 Chronic kidney disease, stage 2 (mild): Secondary | ICD-10-CM

## 2018-09-14 DIAGNOSIS — E782 Mixed hyperlipidemia: Secondary | ICD-10-CM | POA: Diagnosis not present

## 2018-09-14 DIAGNOSIS — R42 Dizziness and giddiness: Secondary | ICD-10-CM | POA: Diagnosis not present

## 2018-09-14 DIAGNOSIS — E1122 Type 2 diabetes mellitus with diabetic chronic kidney disease: Secondary | ICD-10-CM | POA: Diagnosis not present

## 2018-09-14 NOTE — Progress Notes (Signed)
Careteam: Patient Care Team: Gayland Curry, DO as PCP - General (Geriatric Medicine)  Advanced Directive information    No Known Allergies  Chief Complaint  Patient presents with  . Acute Visit    follow-up from ED     HPI: Patient is a 78 y.o. male seen in the office today for follow up after ED. Pt had acute onset dizziness and difficulty walking and vomiting. Symptoms began at 3:30 PM while driving home from bowling.  The patient described vertigo type symptoms and nausea with vomiting.  He returned home where his power of attorney stated that he could not walk.  He was complaining of mild dizziness at that time and had 3 more episodes of vomiting.  No ringing in the ears.  Patient has had some nasal congestion but no earache or other symptoms.  The patient denies any speech change, difficulty swallowing, unilateral weakness/numbness.  No prior history of stroke.  They got in the car and drove to urgent care.  After approximately 45 minutes his symptoms have resolved.  Patient is not experiencing any chest pain, palpitations, shortness of breath, or diaphoresis. He went to ED and had CT then was referred to [facility] MRI which were both negative. Thought to be peripheral vertigo and discharged him on meclizine but he has not needed. He has been in normal health. He is recovering from not sleeping during his ED visit.      Review of Systems:  Review of Systems  Constitutional: Negative for chills, fever and malaise/fatigue.  HENT: Positive for congestion and hearing loss.   Eyes: Negative for blurred vision.  Respiratory: Negative for cough, sputum production and shortness of breath.   Cardiovascular: Negative for chest pain, palpitations and leg swelling.  Gastrointestinal: Negative for abdominal pain, constipation and diarrhea.  Genitourinary: Negative for dysuria.  Musculoskeletal: Negative for falls and joint pain.  Skin: Negative for itching and rash.  Neurological: Negative  for dizziness and loss of consciousness.  Endo/Heme/Allergies: Does not bruise/bleed easily.  Psychiatric/Behavioral: Negative for depression and memory loss. The patient is not nervous/anxious and does not have insomnia.     Past Medical History:  Diagnosis Date  . Arthritis   . Chronic kidney disease    CKD- Stage  II  . Colon cancer ([place])    2014  . Colon polyps    adenomatous  . Diabetes mellitus without complication ([place])   . Eczema   . Hypercholesteremia   . Hypertension    Past Surgical History:  Procedure Laterality Date  . COLON SURGERY  2014 & 2015   resection of colon & removal of poylp ; Dr Kathleene Hazel, Dr Sherlene Shams  . COLONOSCOPY  09/29/2013  . COLONOSCOPY WITH PROPOFOL N/A 03/10/2015   Procedure: COLONOSCOPY WITH PROPOFOL;  Surgeon: Inda Castle, MD;  Location: [facility];  Service: Endoscopy;  Laterality: N/A;  . EXTERNAL EAR SURGERY  1954  . FLEXIBLE SIGMOIDOSCOPY N/A 07/21/2014   Procedure: FLEXIBLE SIGMOIDOSCOPY;  Surgeon: Inda Castle, MD;  Location: [facility];  Service: Endoscopy;  Laterality: N/A;   Social History:   reports that he has never smoked. He has never used smokeless tobacco. He reports current alcohol use of about 7.0 standard drinks of alcohol per week. He reports that he does not use drugs.  Family History  Problem Relation Age of Onset  . Varicose Veins Mother   . Stroke Mother   . Glaucoma Mother   . Kidney disease Mother   .  Hypertension Father   . Heart attack Father   . Cancer Brother        testicular cancer  . Hyperlipidemia Sister   . Meniere's disease Sister     Medications: Patient's Medications  New Prescriptions   No medications on file  Previous Medications   ATORVASTATIN (LIPITOR) 80 MG TABLET    Take 1 tablet (80 mg total) by mouth daily.   FERROUS SULFATE 325 (65 FE) MG TABLET    Take 325 mg by mouth daily.   FINASTERIDE (PROSCAR) 5 MG TABLET    TAKE ONE TABLET BY MOUTH DAILY   GLIPIZIDE (GLUCOTROL XL) 5 MG  24 HR TABLET    TAKE ONE TABLET BY MOUTH EVERY MORNING WITH BREAKFAST   GLUCOSE BLOOD TEST STRIP    USE TO CHECK BLOOD SUGARS ONCE DAILY AS DIRECTED FOR DIABETES   LINAGLIPTIN (TRADJENTA) 5 MG TABS TABLET    Take 1 tablet (5 mg total) by mouth daily.   LISINOPRIL (PRINIVIL,ZESTRIL) 10 MG TABLET    TAKE ONE-HALF TABLET BY MOUTH DAILY   LISINOPRIL (PRINIVIL,ZESTRIL) 5 MG TABLET    TAKE ONE TABLET BY MOUTH DAILY   MECLIZINE (ANTIVERT) 12.5 MG TABLET    Take 1 tablet (12.5 mg total) by mouth 3 (three) times daily as needed for dizziness.   METFORMIN (GLUCOPHAGE) 1000 MG TABLET    TAKE 1 TABLET BY MOUTH 2 TIMES DAILY WITH A MEAL.   TAMSULOSIN (FLOMAX) 0.4 MG CAPS CAPSULE    Take 1 capsule (0.4 mg total) by mouth daily.  Modified Medications   No medications on file  Discontinued Medications   No medications on file     Physical Exam:  Vitals:   09/14/18 1102  BP: 120/70  Pulse: 74  Temp: 97.7 F (36.5 C)  TempSrc: Oral  SpO2: 93%  Weight: 200 lb (90.7 kg)  Height: 5\' 5"  (1.651 m)   Body mass index is 33.28 kg/m.  Physical Exam Constitutional:      General: He is not in acute distress.    Appearance: He is well-developed.  HENT:     Head: Normocephalic and atraumatic.     Right Ear: Tympanic membrane and ear canal normal. There is no impacted cerumen.     Left Ear: Tympanic membrane and ear canal normal. There is no impacted cerumen.     Nose: No congestion or rhinorrhea.     Mouth/Throat:     Mouth: Mucous membranes are moist.  Eyes:     Pupils: Pupils are equal, round, and reactive to light.     Comments: glasses  Cardiovascular:     Rate and Rhythm: Normal rate and regular rhythm.     Heart sounds: Normal heart sounds.  Pulmonary:     Effort: Pulmonary effort is normal. No respiratory distress.     Breath sounds: Normal breath sounds.  Abdominal:     General: Bowel sounds are normal.  Musculoskeletal: Normal range of motion.        General: No tenderness.  Skin:     General: Skin is warm and dry.     Comments: Raised papule central back, pink in color, nontender, but itchy and irritating to pt  Neurological:     Mental Status: He is alert and oriented to person, place, and time.     Labs reviewed: Basic Metabolic Panel: Recent Labs    12/21/17 0807 05/22/18 0802 09/12/18 1906  NA 138 139 137  K 4.8 4.8 4.5  CL 105  103 104  CO2 25 29 25   GLUCOSE 101* 100* 120*  BUN 26* 24 32*  CREATININE 1.33* 1.28* 1.29*  CALCIUM 9.0 9.4 9.2   Liver Function Tests: No results for input(s): AST, ALT, ALKPHOS, BILITOT, PROT, ALBUMIN in the last 8760 hours. No results for input(s): LIPASE, AMYLASE in the last 8760 hours. No results for input(s): AMMONIA in the last 8760 hours. CBC: Recent Labs    12/21/17 0807 05/22/18 0802 09/12/18 1906  WBC 7.0 7.2 13.1*  NEUTROABS 4,711 4,918  --   HGB 13.5 12.9* 13.4  HCT 39.3 37.8* 42.7  MCV 85.2 87.3 90.3  PLT 174 163 163   Lipid Panel: Recent Labs    12/21/17 0807 05/22/18 0802  CHOL 150 112  HDL 42 33*  LDLCALC 85 56  TRIG 124 148  CHOLHDL 3.6 3.4   TSH: No results for input(s): TSH in the last 8760 hours. A1C: Lab Results  Component Value Date   HGBA1C 6.7 (H) 05/22/2018     Assessment/Plan 1. Leukocytosis, unspecified type -no signs or symptoms of infection however with elevated wbc noted, will follow up lab today - CBC with Differential/Platelets  2. Mixed hyperlipidemia - Hepatic Function Panel  3. Vertigo Has resolved, No additional episodes  4. Type 2 diabetes mellitus with stage 2 chronic kidney disease, without long-term current use of insulin ([place]) - request Hemoglobin A1c today as he has upcoming lab appt  Next appt: as scheduled with Dr Sharee Holster K. Harvey, [facility] [phone]

## 2018-09-15 LAB — HEMOGLOBIN A1C
Hgb A1c MFr Bld: 7.2 % of total Hgb — ABNORMAL HIGH (ref <5.7)
Mean Plasma Glucose: 160 (calc)
eAG (mmol/L): 8.9 (calc)

## 2018-09-15 LAB — HEPATIC FUNCTION PANEL
AG Ratio: 1.6 (calc) (ref 1.0–2.5)
ALKALINE PHOSPHATASE (APISO): 119 U/L (ref 35–144)
ALT: 32 U/L (ref 9–46)
AST: 28 U/L (ref 10–35)
Albumin: 4.2 g/dL (ref 3.6–5.1)
BILIRUBIN INDIRECT: 0.7 mg/dL (ref 0.2–1.2)
Bilirubin, Direct: 0.2 mg/dL (ref 0.0–0.2)
Globulin: 2.7 g/dL (calc) (ref 1.9–3.7)
Total Bilirubin: 0.9 mg/dL (ref 0.2–1.2)
Total Protein: 6.9 g/dL (ref 6.1–8.1)

## 2018-09-15 LAB — CBC WITH DIFFERENTIAL/PLATELET
Absolute Monocytes: 907 cells/uL (ref 200–950)
Basophils Absolute: 49 cells/uL (ref 0–200)
Basophils Relative: 0.6 %
Eosinophils Absolute: 97 cells/uL (ref 15–500)
Eosinophils Relative: 1.2 %
HCT: 39.9 % (ref 38.5–50.0)
Hemoglobin: 13.4 g/dL (ref 13.2–17.1)
Lymphs Abs: 1580 cells/uL (ref 850–3900)
MCH: 28.9 pg (ref 27.0–33.0)
MCHC: 33.6 g/dL (ref 32.0–36.0)
MCV: 86.2 fL (ref 80.0–100.0)
MPV: 10.7 fL (ref 7.5–12.5)
Monocytes Relative: 11.2 %
Neutro Abs: 5468 cells/uL (ref 1500–7800)
Neutrophils Relative %: 67.5 %
Platelets: 172 10*3/uL (ref 140–400)
RBC: 4.63 10*6/uL (ref 4.20–5.80)
RDW: 12.9 % (ref 11.0–15.0)
Total Lymphocyte: 19.5 %
WBC: 8.1 10*3/uL (ref 3.8–10.8)

## 2018-09-17 ENCOUNTER — Telehealth: Payer: Self-pay | Admitting: *Deleted

## 2018-09-17 NOTE — Telephone Encounter (Signed)
Larena Glassman DeBeer called and stated that patient is still Dizzy and Nauseated, No Fever. Wants to know how do you know it is Positional vs Meniere's and what is the next step.   Larena Glassman is requesting to speak with you directly and wants you to call her at : [phone]

## 2018-09-17 NOTE — Telephone Encounter (Signed)
Did he try the meclizine as recommended in the ED? Would recommended meclizine 12.5 mg by mouth every 6 hours as needed dizziness.

## 2018-09-17 NOTE — Telephone Encounter (Signed)
Mardene Celeste stated patient DID NOT try the Meclizine. She stated that we need to get to the root of why he is having the dizziness.   Scheduled an appointment for Wednesday to discuss. She stated that she will also be there at the appointment.

## 2018-09-18 NOTE — Telephone Encounter (Signed)
Thank you. It is okay to take meclizine 12.5 mg by mouth every 8 hours to help these episodes.

## 2018-09-18 NOTE — Telephone Encounter (Signed)
Noted  

## 2018-09-19 ENCOUNTER — Telehealth: Payer: Self-pay

## 2018-09-19 ENCOUNTER — Encounter: Payer: Self-pay | Admitting: Nurse Practitioner

## 2018-09-19 ENCOUNTER — Ambulatory Visit (INDEPENDENT_AMBULATORY_CARE_PROVIDER_SITE_OTHER): Payer: Medicare Other | Admitting: Nurse Practitioner

## 2018-09-19 VITALS — BP 118/66 | HR 77 | Temp 97.9°F | Resp 10 | Ht 65.0 in | Wt 199.0 lb

## 2018-09-19 DIAGNOSIS — R42 Dizziness and giddiness: Secondary | ICD-10-CM

## 2018-09-19 NOTE — Patient Instructions (Signed)
Start Claritin or Zyrtec 10 mg (this is over the counter) daily  Referral has been placed for ENT- (vestibular specialist)
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
## 2018-09-20 ENCOUNTER — Encounter: Payer: Self-pay | Admitting: *Deleted

## 2018-09-21 NOTE — Telephone Encounter (Signed)
Left message yesterday informing patient's sister that patient does not need any additional labs per Dr.Reed. Future lab appointment canceled

## 2018-09-24 ENCOUNTER — Other Ambulatory Visit: Payer: Medicare Other

## 2018-09-26 DIAGNOSIS — H9190 Unspecified hearing loss, unspecified ear: Secondary | ICD-10-CM | POA: Diagnosis not present

## 2018-09-26 DIAGNOSIS — R42 Dizziness and giddiness: Secondary | ICD-10-CM | POA: Diagnosis not present

## 2018-09-27 ENCOUNTER — Encounter: Payer: Self-pay | Admitting: Internal Medicine

## 2018-09-27 ENCOUNTER — Ambulatory Visit (INDEPENDENT_AMBULATORY_CARE_PROVIDER_SITE_OTHER): Payer: Medicare Other | Admitting: Internal Medicine

## 2018-09-27 VITALS — BP 122/70 | HR 73 | Temp 98.0°F | Ht 65.0 in | Wt 199.0 lb

## 2018-09-27 DIAGNOSIS — E669 Obesity, unspecified: Secondary | ICD-10-CM

## 2018-09-27 DIAGNOSIS — Z6833 Body mass index (BMI) 33.0-33.9, adult: Secondary | ICD-10-CM

## 2018-09-27 DIAGNOSIS — I10 Essential (primary) hypertension: Secondary | ICD-10-CM | POA: Diagnosis not present

## 2018-09-27 DIAGNOSIS — R42 Dizziness and giddiness: Secondary | ICD-10-CM

## 2018-09-27 DIAGNOSIS — H7292 Unspecified perforation of tympanic membrane, left ear: Secondary | ICD-10-CM

## 2018-09-27 DIAGNOSIS — E1122 Type 2 diabetes mellitus with diabetic chronic kidney disease: Secondary | ICD-10-CM | POA: Diagnosis not present

## 2018-09-27 DIAGNOSIS — E782 Mixed hyperlipidemia: Secondary | ICD-10-CM | POA: Diagnosis not present

## 2018-09-27 DIAGNOSIS — N182 Chronic kidney disease, stage 2 (mild): Secondary | ICD-10-CM

## 2018-09-27 MED ORDER — LINAGLIPTIN 5 MG PO TABS: 5.0000 mg | ORAL_TABLET | Freq: Every day | ORAL | 1 refills | Status: DC

## 2018-09-27 MED ORDER — METFORMIN HCL 1000 MG PO TABS: 1000.0000 mg | ORAL_TABLET | Freq: Every day | ORAL | 1 refills | Status: DC

## 2018-09-27 MED ORDER — FINASTERIDE 5 MG PO TABS: 5.0000 mg | ORAL_TABLET | Freq: Every day | ORAL | 1 refills | Status: DC

## 2018-09-27 NOTE — Patient Instructions (Signed)
I recommend you avoid driving for at least 6 months.  You are at risk for sudden dizziness.  This change should help you to lose weight and make better food choices.  Avoid fast food which is high in salt.  Salt can make you have episodes of dizziness.

## 2018-09-27 NOTE — Progress Notes (Signed)
Location:  [facility] clinic Provider:  Emmette Katt L. Mariea Clonts, D.O., C.M.D.   Goals of Care:  Advanced Directives 09/12/2018  Does Patient Have a Medical Advance Directive? Yes  Type of Advance Directive [place]  Does patient want to make changes to medical advance directive? -  Copy of [place] in Chart? -     Chief Complaint  Patient presents with  . Medical Management of Chronic Issues    60mth follow-up    HPI: Patient is a 78 y.o. male seen today for medical management of chronic diseases.    DMII:  Control has worsened recently.  He had been making frequent trips to [employer] before he started with vertigo.    He's unable to drive due to his vertigo episode.    He is now walking with a cane and there are plans for vestibular rehab, an otol[facility] appt [facility] and hearing testing.    He remains on statin therapy for his cholesterol due to diabetes.  Past Medical History:  Diagnosis Date  . Arthritis   . Chronic kidney disease    CKD- Stage  II  . Colon cancer ([place])    2014  . Colon polyps    adenomatous  . Diabetes mellitus without complication ([place])   . Eczema   . Hypercholesteremia   . Hypertension     Past Surgical History:  Procedure Laterality Date  . COLON SURGERY  2014 & 2015   resection of colon & removal of poylp ; Dr Kathleene Hazel, Dr Sherlene Shams  . COLONOSCOPY  09/29/2013  . COLONOSCOPY WITH PROPOFOL N/A 03/10/2015   Procedure: COLONOSCOPY WITH PROPOFOL;  Surgeon: Inda Castle, MD;  Location: [facility];  Service: Endoscopy;  Laterality: N/A;  . EXTERNAL EAR SURGERY  1954  . FLEXIBLE SIGMOIDOSCOPY N/A 07/21/2014   Procedure: FLEXIBLE SIGMOIDOSCOPY;  Surgeon: Inda Castle, MD;  Location: [facility];  Service: Endoscopy;  Laterality: N/A;    No Known Allergies  Outpatient Encounter Medications as of 09/27/2018  Medication Sig  . atorvastatin (LIPITOR) 80 MG tablet Take 1 tablet (80 mg total) by mouth daily.  .  ferrous sulfate 325 (65 FE) MG tablet Take 325 mg by mouth daily.  . finasteride (PROSCAR) 5 MG tablet TAKE ONE TABLET BY MOUTH DAILY  . glipiZIDE (GLUCOTROL XL) 5 MG 24 hr tablet TAKE ONE TABLET BY MOUTH EVERY MORNING WITH BREAKFAST  . glucose blood test strip USE TO CHECK BLOOD SUGARS ONCE DAILY AS DIRECTED FOR DIABETES  . linagliptin (TRADJENTA) 5 MG TABS tablet Take 1 tablet (5 mg total) by mouth daily.  Marland Kitchen lisinopril (PRINIVIL,ZESTRIL) 5 MG tablet TAKE ONE TABLET BY MOUTH DAILY  . meclizine (ANTIVERT) 12.5 MG tablet Take 1 tablet (12.5 mg total) by mouth 3 (three) times daily as needed for dizziness.  . metFORMIN (GLUCOPHAGE) 1000 MG tablet Take 1,000 mg by mouth daily with breakfast.  . tamsulosin (FLOMAX) 0.4 MG CAPS capsule Take 1 capsule (0.4 mg total) by mouth daily.   No facility-administered encounter medications on file as of 09/27/2018.     Review of Systems:  Review of Systems  Constitutional: Negative for chills, fever and malaise/fatigue.  HENT: Positive for ear pain and hearing loss.        Left ear pain and pinhole in tm  Eyes: Positive for blurred vision.  Respiratory: Negative for cough and shortness of breath.   Cardiovascular: Negative for chest pain, palpitations and leg swelling.  Gastrointestinal: Negative for  abdominal pain, constipation, heartburn, nausea and vomiting.  Genitourinary: Negative for dysuria.  Musculoskeletal: Negative for falls and joint pain.  Neurological: Negative for dizziness and loss of consciousness.       Says no dizziness in 2.5 weeks, but his family questions this b/c he's all about wanting to drive again  Endo/Heme/Allergies: Does not bruise/bleed easily.  Psychiatric/Behavioral: Negative for depression and memory loss. The patient is not nervous/anxious and does not have insomnia.        Intellectual disability    Health Maintenance  Topic Date Due  . OPHTHALMOLOGY EXAM  11/17/2017  . FOOT EXAM  01/09/2019  . HEMOGLOBIN A1C   03/15/2019  . TETANUS/TDAP  02/08/2023  . INFLUENZA VACCINE  Completed  . PNA vac Low Risk Adult  Completed    Physical Exam: Vitals:   09/27/18 0852  BP: 122/70  Pulse: 73  Temp: 98 F (36.7 C)  TempSrc: Oral  SpO2: 96%  Weight: 199 lb (90.3 kg)  Height: 5\' 5"  (1.651 m)   Body mass index is 33.12 kg/m. Physical Exam Vitals signs reviewed.  Constitutional:      Appearance: Normal appearance.  HENT:     Right Ear: Tympanic membrane normal.     Ears:     Comments: Perforated TM left (small hole); increased cerumen Cardiovascular:     Rate and Rhythm: Normal rate and regular rhythm.     Pulses: Normal pulses.     Heart sounds: Normal heart sounds.  Pulmonary:     Effort: Pulmonary effort is normal.     Breath sounds: Normal breath sounds.  Abdominal:     General: Bowel sounds are normal.  Musculoskeletal: Normal range of motion.  Skin:    General: Skin is warm and dry.  Neurological:     General: No focal deficit present.     Mental Status: He is alert and oriented to person, place, and time.     Comments: Drags left leg slightly when walking with cane  Psychiatric:        Mood and Affect: Mood normal.     Comments: Upset about not being able to drive     Labs reviewed: Basic Metabolic Panel: Recent Labs    12/21/17 0807 05/22/18 0802 09/12/18 1906  NA 138 139 137  K 4.8 4.8 4.5  CL 105 103 104  CO2 25 29 25   GLUCOSE 101* 100* 120*  BUN 26* 24 32*  CREATININE 1.33* 1.28* 1.29*  CALCIUM 9.0 9.4 9.2   Liver Function Tests: Recent Labs    09/14/18 1122  AST 28  ALT 32  BILITOT 0.9  PROT 6.9   No results for input(s): LIPASE, AMYLASE in the last 8760 hours. No results for input(s): AMMONIA in the last 8760 hours. CBC: Recent Labs    12/21/17 0807 05/22/18 0802 09/12/18 1906 09/14/18 1122  WBC 7.0 7.2 13.1* 8.1  NEUTROABS 4,711 4,918  --  5,468  HGB 13.5 12.9* 13.4 13.4  HCT 39.3 37.8* 42.7 39.9  MCV 85.2 87.3 90.3 86.2  PLT 174 163 163  172   Lipid Panel: Recent Labs    12/21/17 0807 05/22/18 0802  CHOL 150 112  HDL 42 33*  LDLCALC 85 56  TRIG 124 148  CHOLHDL 3.6 3.4   Lab Results  Component Value Date   HGBA1C 7.2 (H) 09/14/2018    Procedures since last visit: Ct Head Wo Contrast  Result Date: 09/12/2018 CLINICAL DATA:  Dizziness EXAM: CT HEAD WITHOUT CONTRAST  TECHNIQUE: Contiguous axial images were obtained from the base of the skull through the vertex without intravenous contrast. COMPARISON:  None. FINDINGS: Brain: Mild age related volume loss. No acute intracranial abnormality. Specifically, no hemorrhage, hydrocephalus, mass lesion, acute infarction, or significant intracranial injury. Vascular: No hyperdense vessel or unexpected calcification. Skull: No acute calvarial abnormality. Sinuses/Orbits: Visualized paranasal sinuses and mastoids clear. Orbital soft tissues unremarkable. Other: None IMPRESSION: No acute intracranial abnormality. Electronically Signed   By: Rolm Baptise M.D.   On: 09/12/2018 19:47   Mr Brain Wo Contrast  Result Date: 09/13/2018 CLINICAL DATA:  Dizziness, unsteady gait and vomiting EXAM: MRI HEAD WITHOUT CONTRAST TECHNIQUE: Multiplanar, multiecho pulse sequences of the brain and surrounding structures were obtained without intravenous contrast. COMPARISON:  Head CT 09/12/2018 FINDINGS: BRAIN: There is no acute infarct, acute hemorrhage, hydrocephalus or extra-axial collection. The midline structures are normal. No midline shift or other mass effect. Multifocal white matter hyperintensity, most commonly due to chronic ischemic microangiopathy. Generalized atrophy without lobar predilection. Susceptibility-sensitive sequences show no chronic microhemorrhage or superficial siderosis. VASCULAR: Major intracranial arterial and venous sinus flow voids are normal. SKULL AND UPPER CERVICAL SPINE: Calvarial bone marrow signal is normal. There is no skull base mass. Visualized upper cervical spine and  soft tissues are normal. SINUSES/ORBITS: No fluid levels or advanced mucosal thickening. No mastoid or middle ear effusion. The orbits are normal. IMPRESSION: Atrophy and chronic microvascular ischemia without acute abnormality. Electronically Signed   By: Ulyses Jarred M.D.   On: 09/13/2018 05:15    Assessment/Plan 1. Type 2 diabetes mellitus with stage 2 chronic kidney disease, without long-term current use of insulin (HCC) -cont current regimen -cut down on sugar and sodium in diet -get copy of eye exam  2. Vertigo -suspect BPPV episode, but due to family history of meniere's is seeing specialist about that -dx not given at specialty appt as pt did not tolerate testing -advised NOT to drive for at least 6 mos due to his vertigo which is unpredictable--he is adamant he's going to drive anyway  3. Perforated tympanic membrane, left -newly noted -for E[facility] app[facility] now at [facility] -likely explains pain in his ear -unclear if this led to vertigo  4. Mixed hyperlipidemia -cont to work on diet and cont statin therapy -should get better when he cannot go to [employer]  5. Essential hypertension, benign -cont bp regimen with ace  6. Body mass index (BMI) of 33.0-33.9 in adult -cont to work on dietary changes now that he's going to be more closely monitored by his sister and brother-in-law  65. Obesity (BMI 30-39.9) -continues, work on dietary changes, counseling provided and instructions  Labs/tests ordered: No orders of the defined types were placed in this encounter.   Next appt:  02/04/2019  Sabel Hornbeck L. Maize Brittingham, D.O. [facility] [address] [place], [place] [address] Cell Phone (Mon-Fri 8am-5pm):  [phone] On Call:  [phone] & follow prompts after 5pm & weekends Office Phone:  [phone] Office Fax:  [phone]

## 2018-10-07 ENCOUNTER — Other Ambulatory Visit: Payer: Self-pay | Admitting: Internal Medicine

## 2018-10-07 DIAGNOSIS — E1169 Type 2 diabetes mellitus with other specified complication: Secondary | ICD-10-CM

## 2018-10-07 DIAGNOSIS — E785 Hyperlipidemia, unspecified: Principal | ICD-10-CM

## 2018-10-22 ENCOUNTER — Other Ambulatory Visit: Payer: Self-pay | Admitting: Internal Medicine

## 2018-11-07 ENCOUNTER — Other Ambulatory Visit: Payer: Self-pay | Admitting: Internal Medicine

## 2018-11-07 ENCOUNTER — Other Ambulatory Visit: Payer: Self-pay | Admitting: *Deleted

## 2018-11-07 MED ORDER — BLOOD GLUCOSE METER KIT: PACK | 0 refills | Status: DC

## 2018-11-07 NOTE — Progress Notes (Signed)
Received a request from [place] for a One Touch Delica Plus (Lancets and Lancing Device ). I called in the Rx and sent in a printed copy

## 2018-12-04 ENCOUNTER — Other Ambulatory Visit: Payer: Self-pay | Admitting: Internal Medicine

## 2018-12-05 ENCOUNTER — Other Ambulatory Visit: Payer: Self-pay | Admitting: Internal Medicine

## 2018-12-25 ENCOUNTER — Telehealth: Payer: Self-pay | Admitting: Internal Medicine

## 2018-12-25 NOTE — Telephone Encounter (Signed)
Dr. Lynnda Shields is a chiropractor and per her office she sent pt with a handwritten order to get a xray. Not sure we need to do anything at this point.

## 2018-12-25 NOTE — Telephone Encounter (Signed)
Dee, please call that number find out who Dr. Lynnda Shields is.  ?chiropractor  And what part of the back needs to be xrayed.

## 2018-12-27 DIAGNOSIS — H906 Mixed conductive and sensorineural hearing loss, bilateral: Secondary | ICD-10-CM | POA: Diagnosis not present

## 2018-12-30 ENCOUNTER — Other Ambulatory Visit: Payer: Self-pay | Admitting: Internal Medicine

## 2019-01-01 ENCOUNTER — Telehealth: Payer: Self-pay

## 2019-01-01 DIAGNOSIS — G8929 Other chronic pain: Secondary | ICD-10-CM

## 2019-01-01 DIAGNOSIS — M545 Low back pain, unspecified: Secondary | ICD-10-CM

## 2019-01-01 NOTE — Telephone Encounter (Signed)
Looks like his chiropractor requested this back in 2017 is that what he means?

## 2019-01-01 NOTE — Telephone Encounter (Signed)
Patient called stating he is suppose to have a back xray once a year at [place]. Patient would like a return call once order placed.  Please advise

## 2019-01-01 NOTE — Telephone Encounter (Signed)
I ordered mid and low back xrays for [facility] [employer] can go whenever it's convenient now.  Last time it was [facility] that wanted them.

## 2019-01-01 NOTE — Telephone Encounter (Signed)
Tried calling patient twice and each time theres a recording stating the person is not able to receive calls at this time.   Will try again later.

## 2019-01-02 NOTE — Telephone Encounter (Signed)
Tried calling patient. Recording stating patient is NOT able to receive calls at this time.

## 2019-01-03 ENCOUNTER — Other Ambulatory Visit: Payer: Self-pay | Admitting: Internal Medicine

## 2019-01-03 ENCOUNTER — Ambulatory Visit
Admission: RE | Admit: 2019-01-03 | Discharge: 2019-01-03 | Disposition: A | Discharge status: Home / Self Care | Payer: Medicare Other | Source: Ambulatory Visit | Attending: Internal Medicine | Admitting: Internal Medicine

## 2019-01-03 ENCOUNTER — Other Ambulatory Visit: Payer: Self-pay

## 2019-01-03 DIAGNOSIS — M47816 Spondylosis without myelopathy or radiculopathy, lumbar region: Secondary | ICD-10-CM | POA: Diagnosis not present

## 2019-01-03 DIAGNOSIS — G8929 Other chronic pain: Secondary | ICD-10-CM

## 2019-01-03 DIAGNOSIS — M546 Pain in thoracic spine: Secondary | ICD-10-CM | POA: Diagnosis not present

## 2019-01-03 NOTE — Telephone Encounter (Signed)
Tried calling patient. Recording stating patient is NOT able to receive calls at this time.   Forwarded to [place].

## 2019-01-03 NOTE — Telephone Encounter (Signed)
Spoke with sister and she will let pt know and in the future please call sister, pt is hard of hearing.

## 2019-01-04 ENCOUNTER — Other Ambulatory Visit: Payer: Self-pay | Admitting: Internal Medicine

## 2019-01-04 DIAGNOSIS — R9389 Abnormal findings on diagnostic imaging of other specified body structures: Secondary | ICD-10-CM

## 2019-01-09 DIAGNOSIS — M9903 Segmental and somatic dysfunction of lumbar region: Secondary | ICD-10-CM | POA: Diagnosis not present

## 2019-01-09 DIAGNOSIS — M9904 Segmental and somatic dysfunction of sacral region: Secondary | ICD-10-CM | POA: Diagnosis not present

## 2019-01-09 DIAGNOSIS — S338XXA Sprain of other parts of lumbar spine and pelvis, initial encounter: Secondary | ICD-10-CM | POA: Diagnosis not present

## 2019-01-09 DIAGNOSIS — S39012A Strain of muscle, fascia and tendon of lower back, initial encounter: Secondary | ICD-10-CM | POA: Diagnosis not present

## 2019-01-09 DIAGNOSIS — M9902 Segmental and somatic dysfunction of thoracic region: Secondary | ICD-10-CM | POA: Diagnosis not present

## 2019-01-11 DIAGNOSIS — M9904 Segmental and somatic dysfunction of sacral region: Secondary | ICD-10-CM | POA: Diagnosis not present

## 2019-01-11 DIAGNOSIS — S39012A Strain of muscle, fascia and tendon of lower back, initial encounter: Secondary | ICD-10-CM | POA: Diagnosis not present

## 2019-01-11 DIAGNOSIS — M9902 Segmental and somatic dysfunction of thoracic region: Secondary | ICD-10-CM | POA: Diagnosis not present

## 2019-01-11 DIAGNOSIS — M9903 Segmental and somatic dysfunction of lumbar region: Secondary | ICD-10-CM | POA: Diagnosis not present

## 2019-01-11 DIAGNOSIS — S338XXA Sprain of other parts of lumbar spine and pelvis, initial encounter: Secondary | ICD-10-CM | POA: Diagnosis not present

## 2019-01-14 DIAGNOSIS — M9904 Segmental and somatic dysfunction of sacral region: Secondary | ICD-10-CM | POA: Diagnosis not present

## 2019-01-14 DIAGNOSIS — S39012A Strain of muscle, fascia and tendon of lower back, initial encounter: Secondary | ICD-10-CM | POA: Diagnosis not present

## 2019-01-14 DIAGNOSIS — S338XXA Sprain of other parts of lumbar spine and pelvis, initial encounter: Secondary | ICD-10-CM | POA: Diagnosis not present

## 2019-01-14 DIAGNOSIS — M9903 Segmental and somatic dysfunction of lumbar region: Secondary | ICD-10-CM | POA: Diagnosis not present

## 2019-01-14 DIAGNOSIS — M9902 Segmental and somatic dysfunction of thoracic region: Secondary | ICD-10-CM | POA: Diagnosis not present

## 2019-01-15 ENCOUNTER — Other Ambulatory Visit: Payer: Self-pay | Admitting: Internal Medicine

## 2019-01-16 DIAGNOSIS — S39012A Strain of muscle, fascia and tendon of lower back, initial encounter: Secondary | ICD-10-CM | POA: Diagnosis not present

## 2019-01-16 DIAGNOSIS — M9903 Segmental and somatic dysfunction of lumbar region: Secondary | ICD-10-CM | POA: Diagnosis not present

## 2019-01-16 DIAGNOSIS — M9902 Segmental and somatic dysfunction of thoracic region: Secondary | ICD-10-CM | POA: Diagnosis not present

## 2019-01-16 DIAGNOSIS — S338XXA Sprain of other parts of lumbar spine and pelvis, initial encounter: Secondary | ICD-10-CM | POA: Diagnosis not present

## 2019-01-16 DIAGNOSIS — M9904 Segmental and somatic dysfunction of sacral region: Secondary | ICD-10-CM | POA: Diagnosis not present

## 2019-01-21 DIAGNOSIS — Z7984 Long term (current) use of oral hypoglycemic drugs: Secondary | ICD-10-CM | POA: Diagnosis not present

## 2019-01-21 DIAGNOSIS — H2513 Age-related nuclear cataract, bilateral: Secondary | ICD-10-CM | POA: Diagnosis not present

## 2019-01-21 DIAGNOSIS — E119 Type 2 diabetes mellitus without complications: Secondary | ICD-10-CM | POA: Diagnosis not present

## 2019-01-25 ENCOUNTER — Other Ambulatory Visit: Payer: Medicare Other

## 2019-01-28 ENCOUNTER — Other Ambulatory Visit: Payer: Medicare Other

## 2019-01-28 ENCOUNTER — Other Ambulatory Visit: Payer: Self-pay | Admitting: Internal Medicine

## 2019-01-28 ENCOUNTER — Other Ambulatory Visit: Payer: Self-pay

## 2019-01-28 DIAGNOSIS — I10 Essential (primary) hypertension: Secondary | ICD-10-CM | POA: Diagnosis not present

## 2019-01-28 DIAGNOSIS — Z6833 Body mass index (BMI) 33.0-33.9, adult: Secondary | ICD-10-CM

## 2019-01-28 DIAGNOSIS — E1122 Type 2 diabetes mellitus with diabetic chronic kidney disease: Secondary | ICD-10-CM | POA: Diagnosis not present

## 2019-01-28 DIAGNOSIS — E782 Mixed hyperlipidemia: Secondary | ICD-10-CM

## 2019-01-28 DIAGNOSIS — E669 Obesity, unspecified: Secondary | ICD-10-CM

## 2019-01-28 DIAGNOSIS — N182 Chronic kidney disease, stage 2 (mild): Secondary | ICD-10-CM

## 2019-01-29 ENCOUNTER — Other Ambulatory Visit: Payer: Self-pay | Admitting: Internal Medicine

## 2019-01-29 LAB — CBC WITH DIFFERENTIAL/PLATELET
Absolute Monocytes: 766 cells/uL (ref 200–950)
Basophils Absolute: 41 cells/uL (ref 0–200)
Basophils Relative: 0.6 %
Eosinophils Absolute: 117 cells/uL (ref 15–500)
Eosinophils Relative: 1.7 %
HCT: 39.9 % (ref 38.5–50.0)
Hemoglobin: 13.2 g/dL (ref 13.2–17.1)
Lymphs Abs: 1297 cells/uL (ref 850–3900)
MCH: 29.4 pg (ref 27.0–33.0)
MCHC: 33.1 g/dL (ref 32.0–36.0)
MCV: 88.9 fL (ref 80.0–100.0)
MPV: 10.4 fL (ref 7.5–12.5)
Monocytes Relative: 11.1 %
Neutro Abs: 4678 cells/uL (ref 1500–7800)
Neutrophils Relative %: 67.8 %
Platelets: 165 10*3/uL (ref 140–400)
RBC: 4.49 10*6/uL (ref 4.20–5.80)
RDW: 12.9 % (ref 11.0–15.0)
Total Lymphocyte: 18.8 %
WBC: 6.9 10*3/uL (ref 3.8–10.8)

## 2019-01-29 LAB — COMPLETE METABOLIC PANEL WITH GFR
AG Ratio: 1.7 (calc) (ref 1.0–2.5)
ALT: 18 U/L (ref 9–46)
AST: 20 U/L (ref 10–35)
Albumin: 4.3 g/dL (ref 3.6–5.1)
Alkaline phosphatase (APISO): 81 U/L (ref 35–144)
BUN/Creatinine Ratio: 23 (calc) — ABNORMAL HIGH (ref 6–22)
BUN: 28 mg/dL — ABNORMAL HIGH (ref 7–25)
CO2: 27 mmol/L (ref 20–32)
Calcium: 9.2 mg/dL (ref 8.6–10.3)
Chloride: 105 mmol/L (ref 98–110)
Creat: 1.21 mg/dL — ABNORMAL HIGH (ref 0.70–1.18)
GFR, Est African American: 66 mL/min/{1.73_m2} (ref >=60)
GFR, Est Non African American: 57 mL/min/{1.73_m2} — ABNORMAL LOW (ref >=60)
Globulin: 2.5 g/dL (calc) (ref 1.9–3.7)
Glucose, Bld: 117 mg/dL — ABNORMAL HIGH (ref 65–99)
Potassium: 4.8 mmol/L (ref 3.5–5.3)
Sodium: 140 mmol/L (ref 135–146)
Total Bilirubin: 0.7 mg/dL (ref 0.2–1.2)
Total Protein: 6.8 g/dL (ref 6.1–8.1)

## 2019-01-29 LAB — LIPID PANEL
Cholesterol: 117 mg/dL (ref <200)
HDL: 43 mg/dL (ref >=40)
LDL Cholesterol (Calc): 54 mg/dL (calc)
Non-HDL Cholesterol (Calc): 74 mg/dL (calc) (ref <130)
Total CHOL/HDL Ratio: 2.7 (calc) (ref <5.0)
Triglycerides: 115 mg/dL (ref <150)

## 2019-01-29 LAB — HEMOGLOBIN A1C
Hgb A1c MFr Bld: 6.6 % of total Hgb — ABNORMAL HIGH (ref <5.7)
Mean Plasma Glucose: 143 (calc)
eAG (mmol/L): 7.9 (calc)

## 2019-02-04 ENCOUNTER — Encounter: Payer: Self-pay | Admitting: Internal Medicine

## 2019-02-04 ENCOUNTER — Other Ambulatory Visit: Payer: Self-pay

## 2019-02-04 ENCOUNTER — Ambulatory Visit (INDEPENDENT_AMBULATORY_CARE_PROVIDER_SITE_OTHER): Payer: Medicare Other | Admitting: Internal Medicine

## 2019-02-04 VITALS — BP 120/68 | HR 94 | Temp 98.4°F | Ht 65.0 in | Wt 199.0 lb

## 2019-02-04 DIAGNOSIS — R935 Abnormal findings on diagnostic imaging of other abdominal regions, including retroperitoneum: Secondary | ICD-10-CM | POA: Diagnosis not present

## 2019-02-04 DIAGNOSIS — I10 Essential (primary) hypertension: Secondary | ICD-10-CM

## 2019-02-04 DIAGNOSIS — E782 Mixed hyperlipidemia: Secondary | ICD-10-CM

## 2019-02-04 DIAGNOSIS — S3210XS Unspecified fracture of sacrum, sequela: Secondary | ICD-10-CM

## 2019-02-04 DIAGNOSIS — G8929 Other chronic pain: Secondary | ICD-10-CM

## 2019-02-04 DIAGNOSIS — E1122 Type 2 diabetes mellitus with diabetic chronic kidney disease: Secondary | ICD-10-CM | POA: Diagnosis not present

## 2019-02-04 DIAGNOSIS — M545 Low back pain, unspecified: Secondary | ICD-10-CM

## 2019-02-04 DIAGNOSIS — D500 Iron deficiency anemia secondary to blood loss (chronic): Secondary | ICD-10-CM

## 2019-02-04 DIAGNOSIS — N402 Nodular prostate without lower urinary tract symptoms: Secondary | ICD-10-CM

## 2019-02-04 DIAGNOSIS — N182 Chronic kidney disease, stage 2 (mild): Secondary | ICD-10-CM

## 2019-02-04 MED ORDER — CLOTRIMAZOLE-BETAMETHASONE 1-0.05 % EX CREA: 1.0000 "application " | TOPICAL_CREAM | Freq: Two times a day (BID) | CUTANEOUS | 3 refills | Status: DC

## 2019-02-04 MED ORDER — DICLOFENAC SODIUM 1 % TD GEL: 4.0000 g | Freq: Four times a day (QID) | TRANSDERMAL | 3 refills | Status: DC

## 2019-02-04 NOTE — Progress Notes (Signed)
Location:  [facility] clinic Provider:  Margaree Sandhu L. Mariea Clonts, D.O., C.M.D.  Goals of Care:  Advanced Directives 09/12/2018  Does Patient Have a Medical Advance Directive? Yes  Type of Advance Directive [place]  Does patient want to make changes to medical advance directive? -  Copy of [place] in Chart? -     Chief Complaint  Patient presents with  . Medical Management of Chronic Issues    52mh follow-up    HPI: Patient is a 77y.o. male seen today for medical management of chronic diseases.    [facility]  He may have a small pinhole in the left ear.  Notes reviewed from Dr. May.  We reviewed his xrays of his thoracic and lumbar.    [place] due to getting up to urinate 1-2 times (1am, 4am).  Last PSA was 4.25.    Just saw Dr. 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    Has not had anymore headaches or dizzy spells.    Past Medical History:  Diagnosis Date  . Arthritis   . Chronic kidney disease    CKD- Stage  II  . Colon cancer ([place]    2014  . Colon polyps    adenomatous  . Diabetes [place] ([place]   . Eczema   . Hypercholesteremia   . Hypertension     Past Surgical History:  Procedure Laterality Date  . COLON SURGERY  2014 & 2015   resection of colon & removal of poylp ; Dr ZKathleene Hazel Dr GSherlene Shams . COLONOSCOPY  09/29/2013  . COLONOSCOPY WITH PROPOFOL N/A 03/10/2015   Procedure: COLONOSCOPY WITH PROPOFOL;  Surgeon: RInda Castle MD;  [facility];  Service: Endoscopy;  Laterality: N/A;  . EXTERNAL EAR SURGERY  1954  . FLEXIBLE SIGMOIDOSCOPY N/A 07/21/2014   Procedure: FLEXIBLE SIGMOIDOSCOPY;  Surgeon: RInda Castle MD;  [facility];  Service: Endoscopy;  Laterality: N/A;    No Known Allergies  Outpatient Encounter Medications as of 02/04/2019  Medication Sig  .  atorvastatin (LIPITOR) 80 MG tablet TAKE ONE TABLET BY MOUTH DAILY  . ferrous sulfate 325 (65 FE) MG tablet Take 325 mg by mouth daily.  . finasteride (PROSCAR) 5 MG tablet Take 1 tablet (5 mg total) by mouth daily.  .Marland KitchenglipiZIDE (GLUCOTROL XL) 5 MG 24 hr tablet TAKE ONE TABLET BY MOUTH EVERY MORNING WITH BREAKFAST  . glucose blood (ONE TOUCH ULTRA TEST) test strip USE TO CHECK BLOOD SUGARS ONCE DAILY AS DIRECTED FOR DIABETES E11.22  . Lancets (ONETOUCH DELICA PLUS [id number] MISC USE UP TO 4 TIMES A DAY AS DIRECTED  . lisinopril (ZESTRIL) 5 MG tablet TAKE ONE TABLET BY MOUTH DAILY  . meclizine (ANTIVERT) 12.5 MG tablet Take 1 tablet (12.5 mg total) by mouth 3 (three) times daily as needed for dizziness.  . metFORMIN (GLUCOPHAGE) 1000 MG tablet Take 1 tablet (1,000 mg total) by mouth daily with breakfast.  . tamsulosin (FLOMAX) 0.4 MG CAPS capsule TAKE ONE CAPSULE BY MOUTH DAILY  . TRADJENTA 5 MG TABS tablet TAKE ONE TABLET BY MOUTH DAILY  . [DISCONTINUED] blood glucose meter kit and supplies Dispense based on patient and insurance preference. Use up to four times daily as directed. (FOR ICD-10 E10.9, E11.9).   No facility-administered encounter medications on file as of 02/04/2019.  Review of Systems:  Review of Systems  Constitutional: Negative for chills, fever and malaise/fatigue.  HENT: Positive for hearing loss.        Left TM with small pinhole  Eyes: Positive for blurred vision.       Cataracts bilaterally  Respiratory: Negative for cough and shortness of breath.   Cardiovascular: Negative for chest pain, palpitations and leg swelling.  Gastrointestinal: Negative for abdominal pain, blood in stool, constipation and melena.  Genitourinary: Negative for dysuria.       Getting up twice at night to urinate  Musculoskeletal: Positive for back pain. Negative for falls and joint pain.  Skin: Negative for itching and rash.       Raised papule mid-back annoys him  Neurological: Negative  for dizziness, loss of consciousness, weakness and headaches.  Endo/Heme/Allergies: Bruises/bleeds easily.  Psychiatric/Behavioral: Negative for depression and memory loss. The patient is not nervous/anxious and does not have insomnia.     Health Maintenance  Topic Date Due  . OPHTHALMOLOGY EXAM  11/17/2017  . FOOT EXAM  01/09/2019  . INFLUENZA VACCINE  03/02/2019  . HEMOGLOBIN A1C  07/30/2019  . TETANUS/TDAP  02/08/2023  . PNA vac Low Risk Adult  Completed    Physical Exam: Vitals:   02/04/19 1437  BP: 140/70  Pulse: 94  Temp: 98.4 F (36.9 C)  TempSrc: Oral  SpO2: 94%  Weight: 199 lb (90.3 kg)  Height: _0  (1.651 m)   Body mass index is 33.12 kg/m. Physical Exam Vitals signs reviewed.  Constitutional:      General: He is not in acute distress.    Appearance: Normal appearance. He is obese. He is not ill-appearing or toxic-appearing.  HENT:     Head: Normocephalic and atraumatic.     Ears:     Comments: HOH Eyes:     Comments: Wears dark glasses  Cardiovascular:     Rate and Rhythm: Normal rate and regular rhythm.     Pulses: Normal pulses.     Heart sounds: Normal heart sounds.  Pulmonary:     Effort: Pulmonary effort is normal.     Breath sounds: Normal breath sounds.  Abdominal:     General: Bowel sounds are normal.     Palpations: Abdomen is soft. There is no mass.     Tenderness: There is no abdominal tenderness.  Musculoskeletal: Normal range of motion.        General: Tenderness present.  Skin:    General: Skin is warm and dry.  Neurological:     General: No focal deficit present.     Mental Status: He is alert and oriented to person, place, and time.  Psychiatric:        Mood and Affect: Mood normal.        Behavior: Behavior normal.        Thought Content: Thought content normal.        Judgment: Judgment normal.     Labs reviewed: Basic Metabolic Panel: Recent Labs    05/22/18 0802 09/12/18 1906 01/28/19 0804  NA 139 137 140  K  4.8 4.5 4.8  CL 103 104 105  CO2 _1 GLUCOSE 100* 120* 117*  BUN 24 32* 28*  CREATININE 1.28* 1.29* 1.21*  CALCIUM 9.4 9.2 9.2   Liver Function Tests: Recent Labs    09/14/18 1122 01/28/19 0804  AST 28 20  ALT 32 18  BILITOT 0.9 0.7  PROT 6.9 6.8   No results for  input(s): LIPASE, AMYLASE in the last 8760 hours. No results for input(s): AMMONIA in the last 8760 hours. CBC: Recent Labs    05/22/18 0802 09/12/18 1906 09/14/18 1122 01/28/19 0804  WBC 7.2 13.1* 8.1 6.9  NEUTROABS 4,918  --  5,468 4,678  HGB 12.9* 13.4 13.4 13.2  HCT 37.8* 42.7 39.9 39.9  MCV 87.3 90.3 86.2 88.9  PLT 163 163 172 165   Lipid Panel: Recent Labs    05/22/18 0802 01/28/19 0804  CHOL 112 117  HDL 33* 43  LDLCALC 56 54  TRIG 148 115  CHOLHDL 3.4 2.7   Lab Results  Component Value Date   HGBA1C 6.6 (H) 01/28/2019    Procedures since last visit: No results found.  Assessment/Plan 1. Chronic midline low back pain without sciatica - requests a topical treatment for his back -sees chiropractor for this but they were closed due to covid  - diclofenac sodium (VOLTAREN) 1 % GEL; Apply 4 g topically 4 (four) times daily. To lower back  Dispense: 150 g; Refill: 3  2. 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 Type 2 diabetes mellitus with stage 2 chronic kidney disease, without long-term current use of insulin (HCC) -cont current therapy--he's lost some weight and his hba1c has improved - COMPLETE METABOLIC PANEL WITH GFR; Future - Hemoglobin A1c; Future  4. Prostate nodule without urinary obstruction -noted and followed by urology -has 2x/night nocturia  5. Essential hypertension, benign -bp did improve on recheck--was 140 and came down to 706C systolic - COMPLETE METABOLIC PANEL WITH  GFR; Future  6. Mixed hyperlipidemia - cholesterol also improved on labs and is on lipitor max dose - Lipid panel; Future  7. Iron deficiency anemia due to chronic blood loss -h/h in normal range  8. Closed fracture of sacrum, unspecified portion of sacrum, sequela -noted incidentally on low back xrays -pt does always sit on a pillow in his chair but he was shocked to learn of this finding (which was noted to be old per the radiologist)  Labs/tests ordered:   Lab Orders     COMPLETE METABOLIC PANEL WITH GFR     Hemoglobin A1c     Lipid panel  Next appt:  05/28/2019   Dorice Stiggers L. Cate Oravec, D.O. [facility] [address], [place] [address] Cell Phone (Mon-Fri 8am-5pm):  [phone] On Call:  [phone] & follow prompts after 5pm & weekends Office Phone:  [phone] Office Fax:  [phone]

## 2019-02-11 ENCOUNTER — Telehealth: Payer: Self-pay

## 2019-02-11 DIAGNOSIS — M9904 Segmental and somatic dysfunction of sacral region: Secondary | ICD-10-CM | POA: Diagnosis not present

## 2019-02-11 DIAGNOSIS — M9902 Segmental and somatic dysfunction of thoracic region: Secondary | ICD-10-CM | POA: Diagnosis not present

## 2019-02-11 DIAGNOSIS — S39012A Strain of muscle, fascia and tendon of lower back, initial encounter: Secondary | ICD-10-CM | POA: Diagnosis not present

## 2019-02-11 DIAGNOSIS — M9903 Segmental and somatic dysfunction of lumbar region: Secondary | ICD-10-CM | POA: Diagnosis not present

## 2019-02-11 DIAGNOSIS — S338XXA Sprain of other parts of lumbar spine and pelvis, initial encounter: Secondary | ICD-10-CM | POA: Diagnosis not present

## 2019-02-11 NOTE — Telephone Encounter (Signed)
Patient called to check the stats of DG Abdomen Scan. I placed patient on a brief hold and called [facility], spoke with [place]. Per Dorice Lamas patient can walk in to have scan  Call was resumed with patient, patient aware of conversation with [place] and verbalized understanding. Patient will walk in to get scan soon.

## 2019-02-12 ENCOUNTER — Ambulatory Visit
Admission: RE | Admit: 2019-02-12 | Discharge: 2019-02-12 | Disposition: A | Discharge status: Home / Self Care | Payer: Medicare Other | Source: Ambulatory Visit | Attending: Internal Medicine | Admitting: Internal Medicine

## 2019-02-12 DIAGNOSIS — R935 Abnormal findings on diagnostic imaging of other abdominal regions, including retroperitoneum: Secondary | ICD-10-CM

## 2019-02-13 ENCOUNTER — Telehealth: Payer: Self-pay

## 2019-02-13 DIAGNOSIS — I728 Aneurysm of other specified arteries: Secondary | ICD-10-CM

## 2019-02-13 NOTE — Telephone Encounter (Signed)
Patient's sister Elvina Mattes) called [facility] today and stated she had spoke to Dr. Mariea Clonts yesterday about her brother's condition and doing a stomach X-ray. She states she is ok with Dr. Mariea Clonts ordering a stomach x-ray on patient. Routing message to provider.

## 2019-02-13 NOTE — Telephone Encounter (Signed)
CT abdomen/pelvis with IV contrast ordered.  Asked for scheduling to contact his sister to arrange.

## 2019-02-14 DIAGNOSIS — M9904 Segmental and somatic dysfunction of sacral region: Secondary | ICD-10-CM | POA: Diagnosis not present

## 2019-02-14 DIAGNOSIS — S39012A Strain of muscle, fascia and tendon of lower back, initial encounter: Secondary | ICD-10-CM | POA: Diagnosis not present

## 2019-02-14 DIAGNOSIS — M9903 Segmental and somatic dysfunction of lumbar region: Secondary | ICD-10-CM | POA: Diagnosis not present

## 2019-02-14 DIAGNOSIS — M9902 Segmental and somatic dysfunction of thoracic region: Secondary | ICD-10-CM | POA: Diagnosis not present

## 2019-02-14 DIAGNOSIS — S338XXA Sprain of other parts of lumbar spine and pelvis, initial encounter: Secondary | ICD-10-CM | POA: Diagnosis not present

## 2019-02-18 DIAGNOSIS — S39012A Strain of muscle, fascia and tendon of lower back, initial encounter: Secondary | ICD-10-CM | POA: Diagnosis not present

## 2019-02-18 DIAGNOSIS — S338XXA Sprain of other parts of lumbar spine and pelvis, initial encounter: Secondary | ICD-10-CM | POA: Diagnosis not present

## 2019-02-18 DIAGNOSIS — M9904 Segmental and somatic dysfunction of sacral region: Secondary | ICD-10-CM | POA: Diagnosis not present

## 2019-02-18 DIAGNOSIS — M9903 Segmental and somatic dysfunction of lumbar region: Secondary | ICD-10-CM | POA: Diagnosis not present

## 2019-02-18 DIAGNOSIS — M9902 Segmental and somatic dysfunction of thoracic region: Secondary | ICD-10-CM | POA: Diagnosis not present

## 2019-02-21 DIAGNOSIS — M9902 Segmental and somatic dysfunction of thoracic region: Secondary | ICD-10-CM | POA: Diagnosis not present

## 2019-02-21 DIAGNOSIS — S29012A Strain of muscle and tendon of back wall of thorax, initial encounter: Secondary | ICD-10-CM | POA: Diagnosis not present

## 2019-02-21 DIAGNOSIS — S39012A Strain of muscle, fascia and tendon of lower back, initial encounter: Secondary | ICD-10-CM | POA: Diagnosis not present

## 2019-02-21 DIAGNOSIS — M9903 Segmental and somatic dysfunction of lumbar region: Secondary | ICD-10-CM | POA: Diagnosis not present

## 2019-02-26 ENCOUNTER — Other Ambulatory Visit: Payer: Self-pay | Admitting: Internal Medicine

## 2019-02-26 DIAGNOSIS — E1122 Type 2 diabetes mellitus with diabetic chronic kidney disease: Secondary | ICD-10-CM

## 2019-02-26 DIAGNOSIS — N182 Chronic kidney disease, stage 2 (mild): Secondary | ICD-10-CM

## 2019-02-27 ENCOUNTER — Ambulatory Visit
Admission: RE | Admit: 2019-02-27 | Discharge: 2019-02-27 | Disposition: A | Discharge status: Home / Self Care | Payer: Medicare Other | Source: Ambulatory Visit | Attending: Internal Medicine | Admitting: Internal Medicine

## 2019-02-27 ENCOUNTER — Other Ambulatory Visit: Payer: Self-pay

## 2019-02-27 DIAGNOSIS — I728 Aneurysm of other specified arteries: Secondary | ICD-10-CM

## 2019-02-27 DIAGNOSIS — I700 Atherosclerosis of aorta: Secondary | ICD-10-CM | POA: Diagnosis not present

## 2019-02-27 MED ORDER — IOPAMIDOL (ISOVUE-370) INJECTION 76%
75.0000 mL | Freq: Once | INTRAVENOUS | Status: AC | PRN
  Administered 2019-02-27: 75 mL via INTRAVENOUS

## 2019-02-28 ENCOUNTER — Ambulatory Visit (INDEPENDENT_AMBULATORY_CARE_PROVIDER_SITE_OTHER): Payer: Medicare Other | Admitting: Internal Medicine

## 2019-02-28 ENCOUNTER — Other Ambulatory Visit: Payer: Self-pay

## 2019-02-28 ENCOUNTER — Encounter: Payer: Self-pay | Admitting: Internal Medicine

## 2019-02-28 ENCOUNTER — Encounter: Payer: Self-pay | Admitting: *Deleted

## 2019-02-28 VITALS — BP 132/70 | HR 70 | Temp 97.5°F | Ht 65.0 in | Wt 198.0 lb

## 2019-02-28 DIAGNOSIS — I708 Atherosclerosis of other arteries: Secondary | ICD-10-CM | POA: Diagnosis not present

## 2019-02-28 DIAGNOSIS — I728 Aneurysm of other specified arteries: Secondary | ICD-10-CM

## 2019-02-28 DIAGNOSIS — I700 Atherosclerosis of aorta: Secondary | ICD-10-CM

## 2019-02-28 NOTE — Progress Notes (Signed)
Location:  [facility] clinic Provider:  Donyell Ding L. Mariea Clonts, D.O., C.M.D.  Goals of Care:  Advanced Directives 09/12/2018  Does Patient Have a Medical Advance Directive? Yes  Type of Advance Directive [place]  Does patient want to make changes to medical advance directive? -  Copy of [place] in Chart? -     Chief Complaint  Patient presents with  . Follow-up    discuss CT scan    HPI: Patient is a 78 y.o. male with intellectual disability, h/o DMII, colon cancer and some chronic low back and buttock pain seen today with his sister, [name], to review his CT angiogram of his abdomen and pelvis he had done yesterday due to initial thoracic xray with splenic artery aneurysm suspect and then confirmation on abdominal film.  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    He has no new concerns and he's hearing as well as he does.    Past Medical History:  Diagnosis Date  . Arthritis   . Chronic kidney disease    CKD- Stage  II  . Colon cancer ([place])    2014  . Colon polyps    adenomatous  . Diabetes mellitus without complication ([place])   . Eczema   . Hypercholesteremia   . Hypertension     Past Surgical History:  Procedure Laterality Date  . COLON SURGERY  2014 & 2015   resection of colon & removal of poylp ; Dr Kathleene Hazel, Dr Sherlene Shams  . COLONOSCOPY  09/29/2013  . COLONOSCOPY WITH PROPOFOL N/A 03/10/2015   Procedure: COLONOSCOPY WITH PROPOFOL;  Surgeon: Inda Castle, MD;  Location: [facility];  Service: Endoscopy;  Laterality: N/A;  . EXTERNAL EAR SURGERY  1954  . FLEXIBLE SIGMOIDOSCOPY N/A 07/21/2014   Procedure: FLEXIBLE SIGMOIDOSCOPY;  Surgeon: Inda Castle, MD;  Location: [facility];  Service: Endoscopy;  Laterality: N/A;    No Known  Allergies  Outpatient Encounter Medications as of 02/28/2019  Medication Sig  . atorvastatin (LIPITOR) 80 MG tablet TAKE ONE TABLET BY MOUTH DAILY  . clotrimazole-betamethasone (LOTRISONE) cream Apply 1 application topically 2 (two) times daily.  . diclofenac sodium (VOLTAREN) 1 % GEL Apply 4 g topically 4 (four) times daily. To lower back  . ferrous sulfate 325 (65 FE) MG tablet Take 325 mg by mouth daily.  . finasteride (PROSCAR) 5 MG tablet Take 1 tablet (5 mg total) by mouth daily.  Marland Kitchen glipiZIDE (GLUCOTROL XL) 5 MG 24 hr tablet TAKE ONE TABLET BY MOUTH EVERY MORNING WITH BREAKFAST  . glucose blood (ONE TOUCH ULTRA TEST) test strip USE TO CHECK BLOOD SUGARS ONCE DAILY AS DIRECTED FOR DIABETES E11.22  . Lancets (ONETOUCH DELICA PLUS [id number]) MISC USE UP TO 4 TIMES A DAY AS DIRECTED  . lisinopril (ZESTRIL) 5 MG tablet TAKE ONE TABLET BY MOUTH DAILY  . meclizine (ANTIVERT) 12.5 MG tablet Take 1 tablet (12.5 mg total) by mouth 3 (three) times daily as needed for dizziness.  . metFORMIN (GLUCOPHAGE) 1000 MG tablet Take 1 tablet (1,000 mg total) by mouth daily with breakfast.  . tamsulosin (FLOMAX) 0.4 MG CAPS capsule TAKE ONE CAPSULE BY MOUTH DAILY  . TRADJENTA 5 MG TABS tablet TAKE ONE TABLET BY MOUTH DAILY   No facility-administered encounter medications on file as of 02/28/2019.  Review of Systems:  Review of Systems  Constitutional: Negative for chills and fever.  HENT: Negative for congestion and sore throat.   Eyes: Negative for blurred vision.  Respiratory: Negative for cough and shortness of breath.   Cardiovascular: Negative for chest pain and palpitations.  Gastrointestinal: Negative for abdominal pain, blood in stool, constipation and melena.  Genitourinary: Negative for dysuria.  Musculoskeletal: Negative for falls and joint pain.  Neurological: Negative for dizziness and loss of consciousness.  Psychiatric/Behavioral: Negative for depression and memory loss.    Health  Maintenance  Topic Date Due  . OPHTHALMOLOGY EXAM  11/17/2017  . FOOT EXAM  01/09/2019  . INFLUENZA VACCINE  03/02/2019  . HEMOGLOBIN A1C  07/30/2019  . TETANUS/TDAP  02/08/2023  . PNA vac Low Risk Adult  Completed    Physical Exam: Vitals:   02/28/19 1450  BP: 132/70  Pulse: 70  Temp: (!) 97.5 F (36.4 C)  TempSrc: Oral  SpO2: 95%  Weight: 198 lb (89.8 kg)  Height: 5\' 5"  (1.651 m)   Body mass index is 32.95 kg/m. Physical Exam Vitals signs reviewed.  Constitutional:      General: He is not in acute distress.    Appearance: Normal appearance. He is obese. He is not ill-appearing or toxic-appearing.  HENT:     Head: Normocephalic and atraumatic.  Eyes:     Comments: Wears sunglasses  Cardiovascular:     Pulses: Normal pulses.     Heart sounds: Normal heart sounds.  Pulmonary:     Effort: Pulmonary effort is normal.  Abdominal:     General: There is no distension.     Tenderness: There is no abdominal tenderness.  Musculoskeletal: Normal range of motion.  Skin:    General: Skin is warm and dry.  Neurological:     Mental Status: He is alert. Mental status is at baseline.  Psychiatric:        Mood and Affect: Mood normal.     Labs reviewed: Basic Metabolic Panel: Recent Labs    05/22/18 0802 09/12/18 1906 01/28/19 0804  NA 139 137 140  K 4.8 4.5 4.8  CL 103 104 105  CO2 29 25 27   GLUCOSE 100* 120* 117*  BUN 24 32* 28*  CREATININE 1.28* 1.29* 1.21*  CALCIUM 9.4 9.2 9.2   Liver Function Tests: Recent Labs    09/14/18 1122 01/28/19 0804  AST 28 20  ALT 32 18  BILITOT 0.9 0.7  PROT 6.9 6.8   No results for input(s): LIPASE, AMYLASE in the last 8760 hours. No results for input(s): AMMONIA in the last 8760 hours. CBC: Recent Labs    05/22/18 0802 09/12/18 1906 09/14/18 1122 01/28/19 0804  WBC 7.2 13.1* 8.1 6.9  NEUTROABS 4,918  --  5,468 4,678  HGB 12.9* 13.4 13.4 13.2  HCT 37.8* 42.7 39.9 39.9  MCV 87.3 90.3 86.2 88.9  PLT 163 163 172  165   Lipid Panel: Recent Labs    05/22/18 0802 01/28/19 0804  CHOL 112 117  HDL 33* 43  LDLCALC 56 54  TRIG 148 115  CHOLHDL 3.4 2.7   Lab Results  Component Value Date   HGBA1C 6.6 (H) 01/28/2019    Procedures since last visit: Dg Abd 1 View  Result Date: 02/12/2019 CLINICAL DATA:  Left upper quadrant calcifications EXAM: ABDOMEN - 1 VIEW COMPARISON:  01/03/2019 FINDINGS: Scattered large and small bowel gas is noted. Mild retained fecal material in the transverse colon is seen. Curvilinear densities  are noted in the left upper quadrant again likely related to splenic artery aneurysm. The overall appearance is stable. No acute bony abnormality is noted. IMPRESSION: Stable left upper quadrant calcifications likely related to the splenic artery. Electronically Signed   By: Inez Catalina M.D.   On: 02/12/2019 09:37   Ct Angio Abd/pel W/ And/or W/o  Result Date: 02/27/2019 CLINICAL DATA:  Splenic artery aneurysm noted on radiography. 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 History of colon carcinoma, resected. EXAM: CTA ABDOMEN AND PELVIS WITH CONTRAST TECHNIQUE: Multidetector CT imaging of the abdomen and pelvis was performed using the standard protocol during bolus administration of intravenous contrast. Multiplanar reconstructed images and MIPs were obtained and reviewed to evaluate the vascular anatomy. CONTRAST:  32mL ISOVUE-370 IOPAMIDOL (ISOVUE-370) INJECTION 76% COMPARISON:  None. FINDINGS: VASCULAR Aorta: Mild tortuosity. Scattered calcified atheromatous plaque particularly in the infrarenal segment. No dissection, aneurysm, or stenosis. 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 1.5 cm peripherally calcified distal splenic artery saccular aneurysm. SMA: Widely patent. Replaced hepatic arterial supply, an anatomic variant. Renals: Single left, widely patent. Single right, widely patent. IMA: Patent without evidence of aneurysm, dissection, vasculitis or significant  stenosis. Inflow: Tortuous iliac arterial system with scattered calcified nonocclusive plaque. No aneurysm or dissection. Proximal Outflow: Bilateral common femoral and visualized portions of the superficial and profunda femoral arteries are patent without evidence of aneurysm, dissection, vasculitis or significant stenosis. Veins: Patent hepatic veins, portal vein, SMV, splenic vein, bilateral renal veins, iliac venous system and IVC. No venous pathology identified. Review of the MIP images confirms the above findings. NON-VASCULAR Lower chest: No pleural or pericardial effusion. Small hiatal hernia. Hepatobiliary: No focal liver abnormality is seen. No gallstones, gallbladder wall thickening, or biliary dilatation. Pancreas: Unremarkable. No pancreatic ductal dilatation or surrounding inflammatory changes. Spleen: Normal in size without focal abnormality. Adrenals/Urinary Tract: Normal adrenals. 1.4 cm exophytic cyst from the upper pole left kidney. No hydronephrosis. Urinary bladder incompletely distended, thick-walled. Stomach/Bowel: Small hiatal hernia. The stomach is nondistended. Small bowel decompressed. Colon is nondilated. Anastomotic staple line in the mid sigmoid segment. Lymphatic: No abdominal or pelvic adenopathy. Reproductive: Mild prostate enlargement Other: No ascites. No free air. Musculoskeletal: No acute or significant osseous findings. IMPRESSION: 1. 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 Visceral aneurysms less than 2 cm are at low risk of fracture. 2. 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 3. Aortoiliac atherosclerosis (ICD10-170.0) without stenosis or aneurysm. 4. Small hiatal hernia. Electronically Signed   By: Lucrezia Europe M.D.   On: 02/27/2019 14:18    Assessment/Plan 1. 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 Aortic atherosclerosis ([place]) -noted, discussed that this is due to  plaque build-up from high cholesterol  3. 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 Young Mulvey, D.O. [facility] [address], [place] [address] Cell Phone (Mon-Fri 8am-5pm):  [phone] On Call:  [phone] & follow prompts after 5pm & weekends Office Phone:  [phone] Office Fax:  [phone]

## 2019-03-25 ENCOUNTER — Encounter: Payer: Self-pay | Admitting: Family

## 2019-03-25 ENCOUNTER — Ambulatory Visit (INDEPENDENT_AMBULATORY_CARE_PROVIDER_SITE_OTHER): Payer: Medicare Other | Admitting: Family

## 2019-03-25 ENCOUNTER — Other Ambulatory Visit: Payer: Self-pay

## 2019-03-25 VITALS — BP 118/70 | HR 70 | Temp 97.7°F | Ht 65.0 in | Wt 199.8 lb

## 2019-03-25 DIAGNOSIS — Z23 Encounter for immunization: Secondary | ICD-10-CM | POA: Diagnosis not present

## 2019-03-25 DIAGNOSIS — H6122 Impacted cerumen, left ear: Secondary | ICD-10-CM

## 2019-03-25 DIAGNOSIS — J302 Other seasonal allergic rhinitis: Secondary | ICD-10-CM

## 2019-03-25 MED ORDER — METFORMIN HCL 1000 MG PO TABS: 1000.0000 mg | ORAL_TABLET | Freq: Every day | ORAL | 1 refills | Status: DC

## 2019-03-25 MED ORDER — LORATADINE 10 MG PO TABS: 10.0000 mg | ORAL_TABLET | Freq: Every day | ORAL | 11 refills | Status: DC

## 2019-03-25 NOTE — Progress Notes (Addendum)
Provider: Tanessa Tidd FNP-C  Gayland Curry, DO  Patient Care Team: Alferd Apa as PCP - General (Geriatric Medicine)  Extended Emergency Contact Information Primary Emergency Contact: Debeer,Patricia Address: [address]          [place], [place] [address] Johnnette Litter of [place] Phone: [phone] Relation: Sister  Code Status:  Full code  Goals of care: Advanced Directive information Advanced Directives 09/12/2018  Does Patient Have a Medical Advance Directive? Yes  Type of Advance Directive Healthcare Power of Attorney  Does patient want to make changes to medical advance directive? -  Copy of [place] in Chart? -     Chief Complaint  Patient presents with   Acute Visit    Impacted left ear - patient c/o of left ear clogged    Quality Metric Gaps    Flu Vaccine received here in office     HPI:  Pt is a 78 y.o. male seen today for an acute visit for evaluation of left ear cerumen impaction.He states had a prior history of cerumen impaction.He has not used any ear drops prior to visit but would like left ear lavaged.He denies any pain in the ear.He complains of runny nose.He denies any post nasal drip,coughing,GERD,fever, or chills.    Past Medical History:  Diagnosis Date   Arthritis    Chronic kidney disease    CKD- Stage  II   Colon cancer ([place])    2014   Colon polyps    adenomatous   Diabetes mellitus without complication ([place])    Eczema    Hypercholesteremia    Hypertension    Past Surgical History:  Procedure Laterality Date   COLON SURGERY  2014 & 2015   resection of colon & removal of poylp ; Dr Kathleene Hazel, Dr Sherlene Shams   COLONOSCOPY  09/29/2013   COLONOSCOPY WITH PROPOFOL N/A 03/10/2015   Procedure: COLONOSCOPY WITH PROPOFOL;  Surgeon: Inda Castle, MD;  Location: [facility];  Service: Endoscopy;  Laterality: N/A;   [place] N/A 07/21/2014   Procedure:  FLEXIBLE SIGMOIDOSCOPY;  Surgeon: Inda Castle, MD;  Location: [facility];  Service: Endoscopy;  Laterality: N/A;    No Known Allergies  Outpatient Encounter Medications as of 03/25/2019  Medication Sig   atorvastatin (LIPITOR) 80 MG tablet TAKE ONE TABLET BY MOUTH DAILY   clotrimazole-betamethasone (LOTRISONE) cream Apply 1 application topically 2 (two) times daily.   diclofenac sodium (VOLTAREN) 1 % GEL Apply 4 g topically 4 (four) times daily. To lower back   ferrous sulfate 325 (65 FE) MG tablet Take 325 mg by mouth daily.   finasteride (PROSCAR) 5 MG tablet Take 1 tablet (5 mg total) by mouth daily.   glipiZIDE (GLUCOTROL XL) 5 MG 24 hr tablet TAKE ONE TABLET BY MOUTH EVERY MORNING WITH BREAKFAST   glucose blood (ONE TOUCH ULTRA TEST) test strip USE TO CHECK BLOOD SUGARS ONCE DAILY AS DIRECTED FOR DIABETES E11.22   Lancets (ONETOUCH DELICA PLUS [id number]) MISC USE UP TO 4 TIMES A DAY AS DIRECTED   lisinopril (ZESTRIL) 5 MG tablet TAKE ONE TABLET BY MOUTH DAILY   meclizine (ANTIVERT) 12.5 MG tablet Take 1 tablet (12.5 mg total) by mouth 3 (three) times daily as needed for dizziness.   metFORMIN (GLUCOPHAGE) 1000 MG tablet Take 1 tablet (1,000 mg total) by mouth daily with breakfast.   tamsulosin (FLOMAX) 0.4 MG CAPS capsule TAKE ONE CAPSULE BY MOUTH DAILY  TRADJENTA 5 MG TABS tablet TAKE ONE TABLET BY MOUTH DAILY   [DISCONTINUED] metFORMIN (GLUCOPHAGE) 1000 MG tablet Take 1 tablet (1,000 mg total) by mouth daily with breakfast.   No facility-administered encounter medications on file as of 03/25/2019.     Review of Systems  Constitutional: Negative for appetite change, chills, fatigue and fever.  HENT: Positive for hearing loss and rhinorrhea. Negative for congestion, ear discharge, ear pain, sinus pressure, sinus pain, sneezing, sore throat, tinnitus and trouble swallowing.   Eyes: Positive for visual disturbance. Negative for discharge, redness and itching.        Hx nystagmus   Respiratory: Negative for cough, chest tightness, shortness of breath and wheezing.   Cardiovascular: Negative for chest pain, palpitations and leg swelling.  Skin: Negative for color change, pallor and rash.  Neurological: Negative for dizziness, light-headedness and headaches.    Immunization History  Administered Date(s) Administered   Fluad Quad(high Dose 65+) 03/25/2019   Influenza, High Dose Seasonal PF 04/27/2017, 04/30/2018   Influenza,inj,Quad PF,6+ Mos 05/14/2015, 06/03/2016   Influenza-Unspecified 05/26/2011, 08/15/2012, 05/01/2013   Pneumococcal Conjugate-13 08/13/2015   Pneumococcal Polysaccharide-23 03/12/2009   Tdap 02/07/2013   Zoster 01/03/2016   Pertinent  Health Maintenance Due  Topic Date Due   OPHTHALMOLOGY EXAM  11/17/2017   FOOT EXAM  01/09/2019   HEMOGLOBIN A1C  07/30/2019   INFLUENZA VACCINE  Completed   PNA vac Low Risk Adult  Completed   Fall Risk  03/25/2019 02/28/2019 02/04/2019 09/27/2018 09/19/2018  Falls in the past year? 0 0 0 0 0  Number falls in past yr: 0 0 0 0 0  Comment - - - - -  Injury with Fall? 0 0 0 0 0  Comment - - - - -    Vitals:   03/25/19 0956  BP: 118/70  Pulse: 70  Temp: 97.7 F (36.5 C)  TempSrc: Oral  SpO2: 98%  Weight: 199 lb 12.8 oz (90.6 kg)  Height: 5\' 5"  (1.651 m)   Body mass index is 33.25 kg/m. Physical Exam Constitutional:      General: He is not in acute distress.    Appearance: Normal appearance. He is obese. He is not ill-appearing.  HENT:     Head: Normocephalic.     Right Ear: Tympanic membrane, ear canal and external ear normal. There is no impacted cerumen.     Left Ear: There is impacted cerumen.     Ears:     Comments: Left ear lavaged with warm water moderate amounts of cerumen noted.lavaged and removed with alligator forceps.patient tolerated procedure well.     Nose: Rhinorrhea present. No congestion.     Mouth/Throat:     Mouth: Mucous membranes are moist.      Pharynx: Oropharynx is clear. No oropharyngeal exudate.  Eyes:     General: No scleral icterus.       Right eye: No discharge.        Left eye: No discharge.     Conjunctiva/sclera: Conjunctivae normal.  Neck:     Musculoskeletal: Normal range of motion. No neck rigidity or muscular tenderness.  Cardiovascular:     Rate and Rhythm: Normal rate and regular rhythm.     Pulses: Normal pulses.     Heart sounds: Normal heart sounds. No murmur. No friction rub. No gallop.   Pulmonary:     Effort: Pulmonary effort is normal. No respiratory distress.     Breath sounds: Normal breath sounds. No wheezing, rhonchi or rales.  Chest:     Chest wall: No tenderness.  Lymphadenopathy:     Cervical: No cervical adenopathy.  Skin:    General: Skin is warm and dry.     Coloration: Skin is not pale.     Findings: No bruising, erythema or rash.  Neurological:     Mental Status: He is alert and oriented to person, place, and time.     Cranial Nerves: No cranial nerve deficit.     Sensory: No sensory deficit.     Motor: No weakness.     Gait: Gait normal.  Psychiatric:        Mood and Affect: Mood normal.        Behavior: Behavior normal.        Thought Content: Thought content normal.        Judgment: Judgment normal.    Labs reviewed: Recent Labs    05/22/18 0802 09/12/18 1906 01/28/19 0804  NA 139 137 140  K 4.8 4.5 4.8  CL 103 104 105  CO2 29 25 27   GLUCOSE 100* 120* 117*  BUN 24 32* 28*  CREATININE 1.28* 1.29* 1.21*  CALCIUM 9.4 9.2 9.2   Recent Labs    09/14/18 1122 01/28/19 0804  AST 28 20  ALT 32 18  BILITOT 0.9 0.7  PROT 6.9 6.8   Recent Labs    05/22/18 0802 09/12/18 1906 09/14/18 1122 01/28/19 0804  WBC 7.2 13.1* 8.1 6.9  NEUTROABS 4,918  --  5,468 4,678  HGB 12.9* 13.4 13.4 13.2  HCT 37.8* 42.7 39.9 39.9  MCV 87.3 90.3 86.2 88.9  PLT 163 163 172 165   No results found for: TSH Lab Results  Component Value Date   HGBA1C 6.6 (H) 01/28/2019   Lab  Results  Component Value Date   CHOL 117 01/28/2019   HDL 43 01/28/2019   LDLCALC 54 01/28/2019   TRIG 115 01/28/2019   CHOLHDL 2.7 01/28/2019    Significant Diagnostic Results in last 30 days:  Ct Angio Abd/pel W/ And/or W/o  Result Date: 02/27/2019 CLINICAL DATA:  Splenic artery aneurysm noted on radiography. 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 History of colon carcinoma, resected. EXAM: CTA ABDOMEN AND PELVIS WITH CONTRAST TECHNIQUE: Multidetector CT imaging of the abdomen and pelvis was performed using the standard protocol during bolus administration of intravenous contrast. Multiplanar reconstructed images and MIPs were obtained and reviewed to evaluate the vascular anatomy. CONTRAST:  54mL ISOVUE-370 IOPAMIDOL (ISOVUE-370) INJECTION 76% COMPARISON:  None. FINDINGS: VASCULAR Aorta: Mild tortuosity. Scattered calcified atheromatous plaque particularly in the infrarenal segment. No dissection, aneurysm, or stenosis. 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 1.5 cm peripherally calcified distal splenic artery saccular aneurysm. SMA: Widely patent. Replaced hepatic arterial supply, an anatomic variant. Renals: Single left, widely patent. Single right, widely patent. IMA: Patent without evidence of aneurysm, dissection, vasculitis or significant stenosis. Inflow: Tortuous iliac arterial system with scattered calcified nonocclusive plaque. No aneurysm or dissection. Proximal Outflow: Bilateral common femoral and visualized portions of the superficial and profunda femoral arteries are patent without evidence of aneurysm, dissection, vasculitis or significant stenosis. Veins: Patent hepatic veins, portal vein, SMV, splenic vein, bilateral renal veins, iliac venous system and IVC. No venous pathology identified. Review of the MIP images confirms the above findings. NON-VASCULAR Lower chest: No pleural or pericardial effusion. Small hiatal hernia. Hepatobiliary: No focal liver  abnormality is seen. No gallstones, gallbladder wall thickening, or biliary dilatation. Pancreas: Unremarkable. No pancreatic ductal dilatation or surrounding inflammatory  changes. Spleen: Normal in size without focal abnormality. Adrenals/Urinary Tract: Normal adrenals. 1.4 cm exophytic cyst from the upper pole left kidney. No hydronephrosis. Urinary bladder incompletely distended, thick-walled. Stomach/Bowel: Small hiatal hernia. The stomach is nondistended. Small bowel decompressed. Colon is nondilated. Anastomotic staple line in the mid sigmoid segment. Lymphatic: No abdominal or pelvic adenopathy. Reproductive: Mild prostate enlargement Other: No ascites. No free air. Musculoskeletal: No acute or significant osseous findings. IMPRESSION: 1. 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 Visceral aneurysms less than 2 cm are at low risk of fracture. 2. 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 3. Aortoiliac atherosclerosis (ICD10-170.0) without stenosis or aneurysm. 4. Small hiatal hernia. Electronically Signed   By: Lucrezia Europe M.D.   On: 02/27/2019 14:18    Assessment/Plan 1. Need for influenza vaccination Afebrile. - Flu Vaccine QUAD High Dose(Fluad)  2. Impacted cerumen of left ear Moderate amount of soft cerumen noted.ear lavaged with warm water and cerumen removed with alligator forceps.patient tolerated procedure well.   3. Seasonal allergies Afebrile.clear nasal drainage.No contact with person with COVID-19. - encouraged to increase fluid intake. - start on Loratadine 10 mg tablet one by mouth daily.   Family/ staff Communication: Reviewed plan of care with patient.   Labs/tests ordered: None   Thania Woodlief C Landa Mullinax, NP

## 2019-03-25 NOTE — Patient Instructions (Signed)
Allergic Rhinitis, Adult Allergic rhinitis is a reaction to allergens in the air. Allergens are tiny specks (particles) in the air that cause your body to have an allergic reaction. This condition cannot be passed from person to person (is not contagious). Allergic rhinitis cannot be cured, but it can be controlled. There are two types of allergic rhinitis:  Seasonal. This type is also called hay fever. It happens only during certain times of the year.  Perennial. This type can happen at any time of the year. What are the causes? This condition may be caused by:  Pollen from grasses, trees, and weeds.  House dust mites.  Pet dander.  Mold. What are the signs or symptoms? Symptoms of this condition include:  Sneezing.  Runny or stuffy nose (nasal congestion).  A lot of mucus in the back of the throat (postnasal drip).  Itchy nose.  Tearing of the eyes.  Trouble sleeping.  Being sleepy during day. How is this treated? There is no cure for this condition. You should avoid things that trigger your symptoms (allergens). Treatment can help to relieve symptoms. This may include:  Medicines that block allergy symptoms, such as antihistamines. These may be given as a shot, nasal spray, or pill.  Shots that are given until your body becomes less sensitive to the allergen (desensitization).  Stronger medicines, if all other treatments have not worked. Follow these instructions at home: Avoiding allergens   Find out what you are allergic to. Common allergens include smoke, dust, and pollen.  Avoid them if you can. These are some of the things that you can do to avoid allergens: ? Replace carpet with wood, tile, or vinyl flooring. Carpet can trap dander and dust. ? Clean any mold found in the home. ? Do not smoke. Do not allow smoking in your home. ? Change your heating and air conditioning filter at least once a month. ? During allergy season:  Keep windows closed as much as  you can. If possible, use air conditioning when there is a lot of pollen in the air.  Use a special filter for allergies with your furnace and air conditioner.  Plan outdoor activities when pollen counts are lowest. This is usually during the early morning or evening hours.  If you do go outdoors when pollen count is high, wear a special mask for people with allergies.  When you come indoors, take a shower and change your clothes before sitting on furniture or bedding. General instructions  Do not use fans in your home.  Do not hang clothes outside to dry.  Wear sunglasses to keep pollen out of your eyes.  Wash your hands right away after you touch household pets.  Take over-the-counter and prescription medicines only as told by your doctor.  Keep all follow-up visits as told by your doctor. This is important. Contact a doctor if:  You have a fever.  You have a cough that does not go away (is persistent).  You start to make whistling sounds when you breathe (wheeze).  Your symptoms do not get better with treatment.  You have thick fluid coming from your nose.  You start to have nosebleeds. Get help right away if:  Your tongue or your lips are swollen.  You have trouble breathing.  You feel dizzy or you feel like you are going to pass out (faint).  You have cold sweats. Summary  Allergic rhinitis is a reaction to allergens in the air.  This condition may be   caused by allergens. These include pollen, dust mites, pet dander, and mold.  Symptoms include a runny, itchy nose, sneezing, or tearing eyes. You may also have trouble sleeping or feel sleepy during the day.  Treatment includes taking medicines and avoiding allergens. You may also get shots or take stronger medicines.  Get help if you have a fever or a cough that does not stop. Get help right away if you are short of breath. This information is not intended to replace advice given to you by your health care  provider. Make sure you discuss any questions you have with your health care provider. Document Released: 11/17/2010 Document Revised: 11/06/2018 Document Reviewed: 02/06/2018 Elsevier Patient Education  2020 Elsevier Inc.  

## 2019-04-18 ENCOUNTER — Other Ambulatory Visit: Payer: Self-pay | Admitting: Internal Medicine

## 2019-04-24 ENCOUNTER — Other Ambulatory Visit: Payer: Self-pay | Admitting: Internal Medicine

## 2019-05-13 ENCOUNTER — Other Ambulatory Visit: Payer: Self-pay

## 2019-05-13 ENCOUNTER — Ambulatory Visit (INDEPENDENT_AMBULATORY_CARE_PROVIDER_SITE_OTHER): Payer: Medicare Other | Admitting: Family

## 2019-05-13 ENCOUNTER — Telehealth: Payer: Self-pay

## 2019-05-13 ENCOUNTER — Encounter: Payer: Self-pay | Admitting: Family

## 2019-05-13 VITALS — BP 120/72 | HR 72 | Temp 98.0°F | Ht 65.0 in | Wt 197.2 lb

## 2019-05-13 DIAGNOSIS — E1122 Type 2 diabetes mellitus with diabetic chronic kidney disease: Secondary | ICD-10-CM

## 2019-05-13 DIAGNOSIS — H6123 Impacted cerumen, bilateral: Secondary | ICD-10-CM

## 2019-05-13 DIAGNOSIS — N182 Chronic kidney disease, stage 2 (mild): Secondary | ICD-10-CM | POA: Diagnosis not present

## 2019-05-13 NOTE — Patient Instructions (Signed)
Will call your pharmacy to check which wrist blood sugar monitor is covered by your insurance then will send a prescription to the pharmacy.

## 2019-05-13 NOTE — Telephone Encounter (Signed)
[facility] is [facility] [address].  I have called multiple times throughout the day and get a busy signal each time.  I called 3 other locations and also get a busy signal.  Have been unable to contact "Steffainee" at the pharmacy to inquire about what is needed to get this for the patient.  Will continue to try.

## 2019-05-13 NOTE — Progress Notes (Signed)
Provider: Polina Burmaster FNP-C  Gayland Curry, DO  Patient Care Team: Alferd Apa as PCP - General (Geriatric Medicine)  Extended Emergency Contact Information Primary Emergency Contact: Debeer,Patricia Address: [address]          [place], [place] [address] Johnnette Litter of [place] Phone: [phone] Relation: Sister  Code Status:  Full Code  Goals of care: Advanced Directive information Advanced Directives 09/12/2018  Does Patient Have a Medical Advance Directive? Yes  Type of Advance Directive [place]  Does patient want to make changes to medical advance directive? -  Copy of [place] in Chart? -     Chief Complaint  Patient presents with  . Acute Visit    Walk-in, requesting ear lavage  . Diabetic Eye Exam    States saw Dr. Gershon Crane in April 2020    HPI:  Pt is a 78 y.o. male seen today at [facility] office for bilateral ear lavage.He has used debrox drops into both ears now here for ear lavage.He denies any pain in the ear,fever, chills or URI's. He states would like an okay from Dr.Reed for a wrist blood sugar monitor to be send to pharmacy.He states may contact his pharmacist Colletta [place].He is not sure what kind of a machine or order needed but states he is tired of having to  Stick his fingers.I've discussed with patient that will call pharmacy to check which kind of wrist blood sugar monitor will be covered by his insurance.    Past Medical History:  Diagnosis Date  . Arthritis   . Chronic kidney disease    CKD- Stage  II  . Colon cancer ([place])    2014  . Colon polyps    adenomatous  . Diabetes mellitus without complication ([place])   . Eczema   . Hypercholesteremia   . Hypertension    Past Surgical History:  Procedure Laterality Date  . COLON SURGERY  2014 & 2015   resection of colon & removal of poylp ; Dr Kathleene Hazel, Dr Sherlene Shams  . COLONOSCOPY  09/29/2013  . COLONOSCOPY WITH PROPOFOL N/A 03/10/2015   Procedure: COLONOSCOPY WITH PROPOFOL;  Surgeon: Inda Castle, MD;  Location: [facility];  Service: Endoscopy;  Laterality: N/A;  . EXTERNAL EAR SURGERY  1954  . FLEXIBLE SIGMOIDOSCOPY N/A 07/21/2014   Procedure: FLEXIBLE SIGMOIDOSCOPY;  Surgeon: Inda Castle, MD;  Location: [facility];  Service: Endoscopy;  Laterality: N/A;    No Known Allergies  Outpatient Encounter Medications as of 05/13/2019  Medication Sig  . atorvastatin (LIPITOR) 80 MG tablet TAKE ONE TABLET BY MOUTH DAILY  . clotrimazole-betamethasone (LOTRISONE) cream Apply 1 application topically 2 (two) times daily.  . diclofenac sodium (VOLTAREN) 1 % GEL Apply 4 g topically 4 (four) times daily. To lower back  . ferrous sulfate 325 (65 FE) MG tablet Take 325 mg by mouth daily.  . finasteride (PROSCAR) 5 MG tablet TAKE ONE TABLET BY MOUTH DAILY  . glipiZIDE (GLUCOTROL XL) 5 MG 24 hr tablet TAKE ONE TABLET BY MOUTH EVERY MORNING WITH BREAKFAST  . glucose blood (ONE TOUCH ULTRA TEST) test strip USE TO CHECK BLOOD SUGARS ONCE DAILY AS DIRECTED FOR DIABETES E11.22  . Lancets (ONETOUCH DELICA PLUS [id number]) MISC USE UP TO 4 TIMES A DAY AS DIRECTED  . lisinopril (ZESTRIL) 5 MG tablet TAKE ONE TABLET BY MOUTH DAILY  . loratadine (CLARITIN) 10 MG tablet Take 1 tablet (10 mg total) by mouth daily.  . meclizine (  ANTIVERT) 12.5 MG tablet Take 1 tablet (12.5 mg total) by mouth 3 (three) times daily as needed for dizziness.  . metFORMIN (GLUCOPHAGE) 1000 MG tablet Take 1 tablet (1,000 mg total) by mouth daily with breakfast.  . tamsulosin (FLOMAX) 0.4 MG CAPS capsule TAKE ONE CAPSULE BY MOUTH DAILY  . TRADJENTA 5 MG TABS tablet TAKE ONE TABLET BY MOUTH DAILY  . zinc sulfate 220 (50 Zn) MG capsule Take 220 mg by mouth daily.   No facility-administered encounter medications on file as of 05/13/2019.     Review of Systems  Constitutional: Negative for appetite change, chills, fatigue and fever.  HENT: Positive for hearing loss.  Negative for congestion, ear discharge, ear pain, rhinorrhea, sinus pressure, sinus pain, sneezing, sore throat and tinnitus.   Eyes: Negative for discharge, redness and itching.  Respiratory: Negative for cough, chest tightness, shortness of breath and wheezing.   Cardiovascular: Negative for chest pain, palpitations and leg swelling.  Skin: Negative for color change, pallor and rash.  Neurological: Negative for dizziness, light-headedness and headaches.  Psychiatric/Behavioral: Negative for agitation, confusion and sleep disturbance. The patient is not nervous/anxious.     Immunization History  Administered Date(s) Administered  . Fluad Quad(high Dose 65+) 03/25/2019  . Influenza, High Dose Seasonal PF 04/27/2017, 04/30/2018  . Influenza,inj,Quad PF,6+ Mos 05/14/2015, 06/03/2016  . Influenza-Unspecified 05/26/2011, 08/15/2012, 05/01/2013  . Pneumococcal Conjugate-13 08/13/2015  . Pneumococcal Polysaccharide-23 03/12/2009  . Tdap 02/07/2013  . Zoster 01/03/2016   Pertinent  Health Maintenance Due  Topic Date Due  . OPHTHALMOLOGY EXAM  11/17/2017  . FOOT EXAM  01/09/2019  . HEMOGLOBIN A1C  07/30/2019  . INFLUENZA VACCINE  Completed  . PNA vac Low Risk Adult  Completed   Fall Risk  03/25/2019 02/28/2019 02/04/2019 09/27/2018 09/19/2018  Falls in the past year? 0 0 0 0 0  Number falls in past yr: 0 0 0 0 0  Comment - - - - -  Injury with Fall? 0 0 0 0 0  Comment - - - - -   Functional Status Survey:    Vitals:   05/13/19 0923  BP: 120/72  Pulse: 72  Temp: 98 F (36.7 C)  TempSrc: Oral  SpO2: 96%  Weight: 197 lb 3.2 oz (89.4 kg)  Height: 5\' 5"  (1.651 m)   Body mass index is 32.82 kg/m. Physical Exam Vitals signs reviewed.  Constitutional:      General: He is not in acute distress.    Appearance: He is obese. He is not ill-appearing.  HENT:     Head: Normocephalic.     Right Ear: There is impacted cerumen.     Left Ear: There is impacted cerumen.     Ears:      Comments: Bilateral ear lavaged moderate amounts of cerumen obtained.Alligator forceps used to remove cerumen on right ear.patient tolerated procedure well.     Nose: Nose normal. No congestion or rhinorrhea.     Mouth/Throat:     Mouth: Mucous membranes are moist.     Pharynx: Oropharynx is clear. No oropharyngeal exudate or posterior oropharyngeal erythema.  Eyes:     General: No scleral icterus.       Right eye: No discharge.        Left eye: No discharge.     Conjunctiva/sclera: Conjunctivae normal.     Pupils: Pupils are equal, round, and reactive to light.  Neck:     Musculoskeletal: Normal range of motion. No neck rigidity or muscular tenderness.  Cardiovascular:     Rate and Rhythm: Normal rate and regular rhythm.     Pulses: Normal pulses.     Heart sounds: Normal heart sounds. No murmur. No friction rub. No gallop.   Pulmonary:     Effort: Pulmonary effort is normal. No respiratory distress.     Breath sounds: Normal breath sounds. No wheezing, rhonchi or rales.  Chest:     Chest wall: No tenderness.  Lymphadenopathy:     Cervical: No cervical adenopathy.  Skin:    General: Skin is warm and dry.     Coloration: Skin is not pale.     Findings: No bruising or erythema.  Neurological:     Mental Status: He is alert and oriented to person, place, and time.     Cranial Nerves: No cranial nerve deficit.     Sensory: No sensory deficit.     Motor: No weakness.     Coordination: Coordination normal.     Gait: Gait normal.  Psychiatric:        Mood and Affect: Mood normal.        Behavior: Behavior normal.        Thought Content: Thought content normal.        Judgment: Judgment normal.    Labs reviewed: Recent Labs    05/22/18 0802 09/12/18 1906 01/28/19 0804  NA 139 137 140  K 4.8 4.5 4.8  CL 103 104 105  CO2 29 25 27   GLUCOSE 100* 120* 117*  BUN 24 32* 28*  CREATININE 1.28* 1.29* 1.21*  CALCIUM 9.4 9.2 9.2   Recent Labs    09/14/18 1122 01/28/19 0804   AST 28 20  ALT 32 18  BILITOT 0.9 0.7  PROT 6.9 6.8   Recent Labs    05/22/18 0802 09/12/18 1906 09/14/18 1122 01/28/19 0804  WBC 7.2 13.1* 8.1 6.9  NEUTROABS 4,918  --  5,468 4,678  HGB 12.9* 13.4 13.4 13.2  HCT 37.8* 42.7 39.9 39.9  MCV 87.3 90.3 86.2 88.9  PLT 163 163 172 165   No results found for: TSH Lab Results  Component Value Date   HGBA1C 6.6 (H) 01/28/2019   Lab Results  Component Value Date   CHOL 117 01/28/2019   HDL 43 01/28/2019   LDLCALC 54 01/28/2019   TRIG 115 01/28/2019   CHOLHDL 2.7 01/28/2019    Significant Diagnostic Results in last 30 days:  No results found.  Assessment/Plan   1.Bilateral impacted cerumen Afebrile.Bilateral ear lavaged moderate amounts of cerumen obtained.Alligator forceps used to remove cerumen on right ear.patient tolerated procedure well.  - Ear Lavage  2. Type 2 diabetes mellitus with stage 2 chronic kidney disease, without long-term current use of insulin (HCC) No CBG log for review.Has orders in place to check blood sugar daily.Request wrist glucometer order to be send to pharmacy.patient unclear what kind of machine but request provider to contact pharmacist [place].CMA will call pharmacist then will fax order.continue on current medication.   Family/ staff Communication: Reviewed plan of care with patient.  Labs/tests ordered: None   Ketty Bitton C Karrina Lye, NP

## 2019-05-14 NOTE — Telephone Encounter (Signed)
I spoke with the pharmacy at [facility] on [address] today.  Steffainee is the pharmacist that Mr. Kong spoke with and she will not be back until Monday, 10/19. No one else knew about a wrist glucometer and there was no documentation about a conversation.  Will call back next week to speak with the Steffainee.

## 2019-05-20 NOTE — Telephone Encounter (Signed)
I spoke with Colletta [place], pharmacist.  She thought patient was talking about [facility], which would eliminate the fingersticks that he doesn't like.  Will see what his insurance will cover and proceed from there.

## 2019-05-22 ENCOUNTER — Telehealth: Payer: Self-pay

## 2019-05-22 NOTE — Telephone Encounter (Signed)
Patient states he doesn't like doing the fingersticks.  He talked to the pharmacist at [facility] and wanted [place] to call her. She was off last week, but I talked to her Monday.  Due to his hearing difficulties communication is difficult, but she felt like he was wanting the [employer].  She said she wasn't sure what his insurance would cover.  Do you feel this is a viable option for him?

## 2019-05-22 NOTE — Telephone Encounter (Signed)
It could be.  We can certainly try ordering to see if he can get it.  We will need to include his hearing loss also in the diagnoses for the device along with his diabetes.

## 2019-05-27 ENCOUNTER — Encounter: Payer: Self-pay | Admitting: Family

## 2019-05-27 ENCOUNTER — Ambulatory Visit: Payer: Self-pay

## 2019-05-28 ENCOUNTER — Encounter: Payer: Self-pay | Admitting: Family

## 2019-05-28 ENCOUNTER — Other Ambulatory Visit: Payer: Self-pay

## 2019-05-28 ENCOUNTER — Ambulatory Visit (INDEPENDENT_AMBULATORY_CARE_PROVIDER_SITE_OTHER): Payer: Medicare Other | Admitting: Family

## 2019-05-28 VITALS — BP 132/68 | HR 82 | Temp 97.8°F | Ht 65.0 in | Wt 203.4 lb

## 2019-05-28 DIAGNOSIS — Z0000 Encounter for general adult medical examination without abnormal findings: Secondary | ICD-10-CM | POA: Diagnosis not present

## 2019-05-28 DIAGNOSIS — N182 Chronic kidney disease, stage 2 (mild): Secondary | ICD-10-CM | POA: Diagnosis not present

## 2019-05-28 DIAGNOSIS — E1122 Type 2 diabetes mellitus with diabetic chronic kidney disease: Secondary | ICD-10-CM

## 2019-05-28 MED ORDER — METFORMIN HCL 1000 MG PO TABS: 1000.0000 mg | ORAL_TABLET | Freq: Every day | ORAL | 1 refills | Status: DC

## 2019-05-28 NOTE — Patient Instructions (Signed)
Justin [name] , Thank you for taking time to come for your Medicare Wellness Visit. I appreciate your ongoing commitment to your health goals. Please review the following plan we discussed and let me know if I can assist you in the future.   Screening recommendations/referrals: Colonoscopy: No record  Recommended yearly ophthalmology/optometry visit for glaucoma screening and checkup Recommended yearly dental visit for hygiene and checkup  Vaccinations: Influenza vaccine: Up to date  Pneumococcal vaccine : Up to date  Tdap vaccine: Up to date  Shingles vaccine : Due for shingrix    Advanced directives: yes   Conditions/risks identified: advance age men > 60 yrs,dyslipidemia,Hypertension,  Next appointment: 1 year   Preventive Care 14 Years and Older, Male Preventive care refers to lifestyle choices and visits with your health care provider that can promote health and wellness. What does preventive care include?  A yearly physical exam. This is also called an annual well check.  Dental exams once or twice a year.  Routine eye exams. Ask your health care provider how often you should have your eyes checked.  Personal lifestyle choices, including:  Daily care of your teeth and gums.  Regular physical activity.  Eating a healthy diet.  Avoiding tobacco and drug use.  Limiting alcohol use.  Practicing safe sex.  Taking low doses of aspirin every day.  Taking vitamin and mineral supplements as recommended by your health care provider. What happens during an annual well check? The services and screenings done by your health care provider during your annual well check will depend on your age, overall health, lifestyle risk factors, and family history of disease. Counseling  Your health care provider may ask you questions about your:  Alcohol use.  Tobacco use.  Drug use.  Emotional well-being.  Home and relationship well-being.  Sexual activity.  Eating habits.   History of falls.  Memory and ability to understand (cognition).  Work and work Statistician. Screening  You may have the following tests or measurements:  Height, weight, and BMI.  Blood pressure.  Lipid and cholesterol levels. These may be checked every 5 years, or more frequently if you are over 26 years old.  Skin check.  Lung cancer screening. You may have this screening every year starting at age 21 if you have a 30-pack-year history of smoking and currently smoke or have quit within the past 15 years.  Fecal occult blood test (FOBT) of the stool. You may have this test every year starting at age 41.  Flexible sigmoidoscopy or colonoscopy. You may have a sigmoidoscopy every 5 years or a colonoscopy every 10 years starting at age 35.  Prostate cancer screening. Recommendations will vary depending on your family history and other risks.  Hepatitis C blood test.  Hepatitis B blood test.  Sexually transmitted disease (STD) testing.  Diabetes screening. This is done by checking your blood sugar (glucose) after you have not eaten for a while (fasting). You may have this done every 1-3 years.  Abdominal aortic aneurysm (AAA) screening. You may need this if you are a current or former smoker.  Osteoporosis. You may be screened starting at age 58 if you are at high risk. Talk with your health care provider about your test results, treatment options, and if necessary, the need for more tests. Vaccines  Your health care provider may recommend certain vaccines, such as:  Influenza vaccine. This is recommended every year.  Tetanus, diphtheria, and acellular pertussis (Tdap, Td) vaccine. You may need a  Td booster every 10 years.  Zoster vaccine. You may need this after age 50.  Pneumococcal 13-valent conjugate (PCV13) vaccine. One dose is recommended after age 39.  Pneumococcal polysaccharide (PPSV23) vaccine. One dose is recommended after age 58. Talk to your health care  provider about which screenings and vaccines you need and how often you need them. This information is not intended to replace advice given to you by your health care provider. Make sure you discuss any questions you have with your health care provider. Document Released: 08/14/2015 Document Revised: 04/06/2016 Document Reviewed: 05/19/2015 Elsevier Interactive Patient Education  [address] Prevention in the Home Falls can cause injuries. They can happen to people of all ages. There are many things you can do to make your home safe and to help prevent falls. What can I do on the outside of my home?  Regularly fix the edges of walkways and driveways and fix any cracks.  Remove anything that might make you trip as you walk through a door, such as a raised step or threshold.  Trim any bushes or trees on the path to your home.  Use bright outdoor lighting.  Clear any walking paths of anything that might make someone trip, such as rocks or tools.  Regularly check to see if handrails are loose or broken. Make sure that both sides of any steps have handrails.  Any raised decks and porches should have guardrails on the edges.  Have any leaves, snow, or ice cleared regularly.  Use sand or salt on walking paths during winter.  Clean up any spills in your garage right away. This includes oil or grease spills. What can I do in the bathroom?  Use night lights.  Install grab bars by the toilet and in the tub and shower. Do not use towel bars as grab bars.  Use non-skid mats or decals in the tub or shower.  If you need to sit down in the shower, use a plastic, non-slip stool.  Keep the floor dry. Clean up any water that spills on the floor as soon as it happens.  Remove soap buildup in the tub or shower regularly.  Attach bath mats securely with double-sided non-slip rug tape.  Do not have throw rugs and other things on the floor that can make you trip. What can I do in  the bedroom?  Use night lights.  Make sure that you have a light by your bed that is easy to reach.  Do not use any sheets or blankets that are too big for your bed. They should not hang down onto the floor.  Have a firm chair that has side arms. You can use this for support while you get dressed.  Do not have throw rugs and other things on the floor that can make you trip. What can I do in the kitchen?  Clean up any spills right away.  Avoid walking on wet floors.  Keep items that you use a lot in easy-to-reach places.  If you need to reach something above you, use a strong step stool that has a grab bar.  Keep electrical cords out of the way.  Do not use floor polish or wax that makes floors slippery. If you must use wax, use non-skid floor wax.  Do not have throw rugs and other things on the floor that can make you trip. What can I do with my stairs?  Do not leave any items on the stairs.  Make sure that there are handrails on both sides of the stairs and use them. Fix handrails that are broken or loose. Make sure that handrails are as long as the stairways.  Check any carpeting to make sure that it is firmly attached to the stairs. Fix any carpet that is loose or worn.  Avoid having throw rugs at the top or bottom of the stairs. If you do have throw rugs, attach them to the floor with carpet tape.  Make sure that you have a light switch at the top of the stairs and the bottom of the stairs. If you do not have them, ask someone to add them for you. What else can I do to help prevent falls?  Wear shoes that:  Do not have high heels.  Have rubber bottoms.  Are comfortable and fit you well.  Are closed at the toe. Do not wear sandals.  If you use a stepladder:  Make sure that it is fully opened. Do not climb a closed stepladder.  Make sure that both sides of the stepladder are locked into place.  Ask someone to hold it for you, if possible.  Clearly mark and  make sure that you can see:  Any grab bars or handrails.  First and last steps.  Where the edge of each step is.  Use tools that help you move around (mobility aids) if they are needed. These include:  Canes.  Walkers.  Scooters.  Crutches.  Turn on the lights when you go into a dark area. Replace any light bulbs as soon as they burn out.  Set up your furniture so you have a clear path. Avoid moving your furniture around.  If any of your floors are uneven, fix them.  If there are any pets around you, be aware of where they are.  Review your medicines with your doctor. Some medicines can make you feel dizzy. This can increase your chance of falling. Ask your doctor what other things that you can do to help prevent falls. This information is not intended to replace advice given to you by your health care provider. Make sure you discuss any questions you have with your health care provider. Document Released: 05/14/2009 Document Revised: 12/24/2015 Document Reviewed: 08/22/2014 Elsevier Interactive Patient Education  2017 [employer].

## 2019-05-28 NOTE — Progress Notes (Signed)
Subjective:   Justin [name] is a 78 y.o. male who presents for Medicare Annual/Subsequent preventive examination.  Review of Systems:   Cardiac Risk Factors include: advanced age (>11men, >19 women);diabetes mellitus;hypertension;dyslipidemia;obesity (BMI >30kg/m2)     Objective:    Vitals: BP 132/68   Pulse 82   Temp 97.8 F (36.6 C) (Temporal)   Ht 5\' 5"  (1.651 m)   Wt 203 lb 6.4 oz (92.3 kg)   SpO2 98%   BMI 33.85 kg/m   Body mass index is 33.85 kg/m.  Advanced Directives 09/12/2018 08/14/2018 05/24/2018 05/22/2018 04/30/2018 01/08/2018 07/27/2017  Does Patient Have a Medical Advance Directive? Yes Yes Yes Yes No Yes No  Type of [place] administrator Power of [place] of [place] -  Does patient want to make changes to medical advance directive? - No - Patient declined No - Patient declined No - Patient declined - No - Patient declined -  Copy of [place] in Chart? - Yes - validated most recent copy scanned in chart (See row information) Yes Yes - Yes -    Tobacco Social History   Tobacco Use  Smoking Status Never Smoker  Smokeless Tobacco Never Used     Counseling given: Not Answered   Clinical Intake:  Pre-visit preparation completed: No  Pain : 0-10 Pain Score: 4  Pain Type: Chronic pain Pain Location: Back Pain Orientation: Lower Pain Descriptors / Indicators: Aching Pain Onset: Other (comment)(5 months) Pain Frequency: Constant Pain Relieving Factors: cream effective Effect of Pain on Daily Activities: no  Pain Relieving Factors: cream effective  BMI - recorded: 33.85 Nutritional Status: BMI > 30  Obese Nutritional Risks: None Diabetes: Yes CBG done?: No Did pt. bring in CBG monitor from home?: No(100's- 140's)  How often do you need to have someone help you when you read instructions, pamphlets, or other written  materials from your doctor or pharmacy?: 1 - Never What is the last grade level you completed in school?: 12 Grade  Interpreter Needed?: No  Information entered by ::   FNP-C  Past Medical History:  Diagnosis Date  . Arthritis   . Chronic kidney disease    CKD- Stage  II  . Colon cancer ([place])    2014  . Colon polyps    adenomatous  . Diabetes mellitus without complication ([place])   . Eczema   . Hypercholesteremia   . Hypertension    Past Surgical History:  Procedure Laterality Date  . COLON SURGERY  2014 & 2015   resection of colon & removal of poylp ; Dr Kathleene Hazel, Dr Sherlene Shams  . COLONOSCOPY  09/29/2013  . COLONOSCOPY WITH PROPOFOL N/A 03/10/2015   Procedure: COLONOSCOPY WITH PROPOFOL;  Surgeon: Inda Castle, MD;  Location: [facility];  Service: Endoscopy;  Laterality: N/A;  . EXTERNAL EAR SURGERY  1954  . FLEXIBLE SIGMOIDOSCOPY N/A 07/21/2014   Procedure: FLEXIBLE SIGMOIDOSCOPY;  Surgeon: Inda Castle, MD;  Location: [facility];  Service: Endoscopy;  Laterality: N/A;   Family History  Problem Relation Age of Onset  . Varicose Veins Mother   . Stroke Mother   . Glaucoma Mother   . Kidney disease Mother   . Hypertension Father   . Heart attack Father   . Cancer Brother        testicular cancer  . Hyperlipidemia Sister   . Meniere's disease Sister    Social  History   Socioeconomic History  . Marital status: Single    Spouse name: Not on file  . Number of children: Not on file  . Years of education: Not on file  . Highest education level: Not on file  Occupational History  . Not on file  Social Needs  . Financial resource strain: Not hard at all  . Food insecurity    Worry: Never true    Inability: Never true  . Transportation needs    Medical: No    Non-medical: No  Tobacco Use  . Smoking status: Never Smoker  . Smokeless tobacco: Never Used  Substance and Sexual Activity  . Alcohol use: Yes    Alcohol/week: 7.0 standard drinks     Types: 7 Cans of beer per week    Comment: daily  . Drug use: No  . Sexual activity: Not on file  Lifestyle  . Physical activity    Days per week: 7 days    Minutes per session: 10 min  . Stress: Not at all  Relationships  . Social connections    Talks on phone: More than three times a week    Gets together: More than three times a week    Attends religious service: More than 4 times per year    Active member of club or organization: No    Attends meetings of clubs or organizations: Never    Relationship status: Never married  Other Topics Concern  . Not on file  Social History Narrative   Single, lives in 2 stories home with 3 people and 1 dog.     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 atorvastatin (LIPITOR) 80 MG tablet TAKE ONE TABLET BY MOUTH DAILY  . clotrimazole-betamethasone (LOTRISONE) cream Apply 1 application topically 2 (two) times daily.  . diclofenac sodium (VOLTAREN) 1 % GEL Apply 4 g topically 4 (four) times daily. To lower back  . ferrous sulfate 325 (65 FE) MG tablet Take 325 mg by mouth daily.  . finasteride (PROSCAR) 5 MG tablet TAKE ONE TABLET BY MOUTH DAILY  . glipiZIDE (GLUCOTROL XL) 5 MG 24 hr tablet TAKE ONE TABLET BY MOUTH EVERY MORNING WITH BREAKFAST  . glucose blood (ONE TOUCH ULTRA TEST) test strip USE TO CHECK BLOOD SUGARS ONCE DAILY AS DIRECTED FOR DIABETES E11.22  . Lancets (ONETOUCH DELICA PLUS 123XX123) MISC USE UP TO 4 TIMES A DAY AS DIRECTED  . lisinopril (ZESTRIL) 5 MG tablet TAKE ONE TABLET BY MOUTH DAILY  . loratadine (CLARITIN) 10 MG tablet Take 1 tablet (10 mg total) by mouth daily.  . meclizine (ANTIVERT) 12.5 MG tablet Take 1 tablet (12.5 mg total) by mouth 3 (three) times daily as needed for dizziness.  . metFORMIN (GLUCOPHAGE) 1000 MG tablet Take 1 tablet (1,000 mg total) by mouth daily with breakfast.   . tamsulosin (FLOMAX) 0.4 MG CAPS capsule TAKE ONE CAPSULE BY MOUTH DAILY  . TRADJENTA 5 MG TABS tablet TAKE ONE TABLET BY MOUTH DAILY  . zinc sulfate 220 (50 Zn) MG capsule Take 220 mg by mouth daily.   No facility-administered encounter medications on file as of 05/28/2019.     Activities of Daily Living In your present state of health, do you have any difficulty performing the following activities: 05/28/2019  Hearing? Y  Vision? N  Difficulty  concentrating or making decisions? N  Walking or climbing stairs? N  Dressing or bathing? N  Doing errands, shopping? N  Preparing Food and eating ? N  Using the Toilet? N  In the past six months, have you accidently leaked urine? N  Do you have problems with loss of bowel control? N  Managing your Medications? N  Managing your Finances? N  Housekeeping or managing your Housekeeping? N  Some recent data might be hidden    Patient Care Team: Gayland Curry, DO as PCP - General (Geriatric Medicine)   Assessment:   This is a routine wellness examination for Justin [name].  Exercise Activities and Dietary recommendations Current Exercise Habits: Home exercise routine, Type of exercise: walking, Time (Minutes): 20, Frequency (Times/Week): 2, Weekly Exercise (Minutes/Week): 40, Intensity: Mild, Exercise limited by: None identified  Goals    . <enter goal here>     Starting 09/19/16, I will maintain my current lifestyle.        Fall Risk Fall Risk  05/28/2019 03/25/2019 02/28/2019 02/04/2019 09/27/2018  Falls in the past year? 0 0 0 0 0  Number falls in past yr: 0 0 0 0 0  Comment - - - - -  Injury with Fall? 0 0 0 0 0  Comment - - - - -   Is the patient's home free of loose throw rugs in walkways, pet beds, electrical cords, etc?   yes      Grab bars in the bathroom? yes      Handrails on the stairs?   yes      Adequate lighting?   yes  Depression Screen PHQ 2/9 Scores 05/28/2019 02/28/2019 02/04/2019 09/27/2018  PHQ - 2 Score 0 0 0 0     Cognitive Function MMSE - Mini Mental State Exam 05/28/2019 05/22/2018 09/19/2016 08/13/2015 05/30/2014  Orientation to time 5 5 5 5 5   Orientation to Place 5 4 5 4 4   Registration 3 3 3 3 3   Attention/ Calculation 0 5 1 5 3   Recall 1 2 3 3 2   Language- name 2 objects 2 2 2 2 2   Language- repeat 1 1 1 1 1   Language- follow 3 step command 2 3 3 3 3   Language- read & follow direction 1 1 1 1 1   Write a sentence 1 1 1 1 1   Copy design 1 0 0 0 0  Total score 22 27 25 28 25         Immunization History  Administered Date(s) Administered  . Fluad Quad(high Dose 65+) 03/25/2019  . Influenza, High Dose Seasonal PF 04/27/2017, 04/30/2018  . Influenza,inj,Quad PF,6+ Mos 05/14/2015, 06/03/2016  . Influenza-Unspecified 05/26/2011, 08/15/2012, 05/01/2013  . Pneumococcal Conjugate-13 08/13/2015  . Pneumococcal Polysaccharide-23 03/12/2009  . Tdap 02/07/2013  . Zoster 01/03/2016    Qualifies for Shingles Vaccine? Requires shingrix vaccine but would like to wait until January,2021 due to cost.   Screening Tests Health Maintenance  Topic Date Due  . FOOT EXAM  01/09/2019  . HEMOGLOBIN A1C  07/30/2019  . OPHTHALMOLOGY EXAM  10/31/2019  . TETANUS/TDAP  02/08/2023  . INFLUENZA VACCINE  Completed  . PNA vac Low Risk Adult  Completed   Cancer Screenings: Lung: Low Dose CT Chest recommended if Age 84-80 years, 30 pack-year currently smoking OR have quit w/in 15years. Patient does not qualify. Colorectal: None would like to wait until 2021 to be ordered.   Additional Screenings: Hepatitis C Screening:Low risk  Plan:   - Refer to podiatrist for annual foot exam  - Declined Shingrix until 2021 due to cost  - would like to wait until 2021 to be ordered.  I have personally reviewed and noted the following in the patient's chart:   . Medical and social history . Use of alcohol, tobacco or illicit drugs  . Current medications and supplements . Functional ability and status .  Nutritional status . Physical activity . Advanced directives . List of other physicians . Hospitalizations, surgeries, and ER visits in previous 12 months . Vitals . Screenings to include cognitive, depression, and falls . Referrals and appointments  In addition, I have reviewed and discussed with patient certain preventive protocols, quality metrics, and best practice recommendations. A written personalized care plan for preventive services as well as general preventive health recommendations were provided to patient.  Sandrea Hughs, NP  05/28/2019

## 2019-06-05 ENCOUNTER — Other Ambulatory Visit: Payer: Self-pay | Admitting: Internal Medicine

## 2019-06-05 ENCOUNTER — Other Ambulatory Visit: Payer: Medicare Other

## 2019-06-05 ENCOUNTER — Other Ambulatory Visit: Payer: Self-pay

## 2019-06-05 DIAGNOSIS — E782 Mixed hyperlipidemia: Secondary | ICD-10-CM | POA: Diagnosis not present

## 2019-06-05 DIAGNOSIS — I10 Essential (primary) hypertension: Secondary | ICD-10-CM | POA: Diagnosis not present

## 2019-06-05 DIAGNOSIS — E1122 Type 2 diabetes mellitus with diabetic chronic kidney disease: Secondary | ICD-10-CM | POA: Diagnosis not present

## 2019-06-05 DIAGNOSIS — N182 Chronic kidney disease, stage 2 (mild): Secondary | ICD-10-CM

## 2019-06-06 LAB — COMPLETE METABOLIC PANEL WITH GFR
AG Ratio: 1.5 (calc) (ref 1.0–2.5)
ALT: 16 U/L (ref 9–46)
AST: 18 U/L (ref 10–35)
Albumin: 4.4 g/dL (ref 3.6–5.1)
Alkaline phosphatase (APISO): 81 U/L (ref 35–144)
BUN/Creatinine Ratio: 24 (calc) — ABNORMAL HIGH (ref 6–22)
BUN: 32 mg/dL — ABNORMAL HIGH (ref 7–25)
CO2: 27 mmol/L (ref 20–32)
Calcium: 9.6 mg/dL (ref 8.6–10.3)
Chloride: 104 mmol/L (ref 98–110)
Creat: 1.34 mg/dL — ABNORMAL HIGH (ref 0.70–1.18)
GFR, Est African American: 58 mL/min/{1.73_m2} — ABNORMAL LOW (ref >=60)
GFR, Est Non African American: 50 mL/min/{1.73_m2} — ABNORMAL LOW (ref >=60)
Globulin: 2.9 g/dL (calc) (ref 1.9–3.7)
Glucose, Bld: 98 mg/dL (ref 65–99)
Potassium: 4.6 mmol/L (ref 3.5–5.3)
Sodium: 139 mmol/L (ref 135–146)
Total Bilirubin: 0.8 mg/dL (ref 0.2–1.2)
Total Protein: 7.3 g/dL (ref 6.1–8.1)

## 2019-06-06 LAB — LIPID PANEL
Cholesterol: 116 mg/dL (ref <200)
HDL: 40 mg/dL (ref >=40)
LDL Cholesterol (Calc): 58 mg/dL (calc)
Non-HDL Cholesterol (Calc): 76 mg/dL (calc) (ref <130)
Total CHOL/HDL Ratio: 2.9 (calc) (ref <5.0)
Triglycerides: 98 mg/dL (ref <150)

## 2019-06-06 LAB — HEMOGLOBIN A1C
Hgb A1c MFr Bld: 6.6 % of total Hgb — ABNORMAL HIGH (ref <5.7)
Mean Plasma Glucose: 143 (calc)
eAG (mmol/L): 7.9 (calc)

## 2019-06-10 ENCOUNTER — Ambulatory Visit (INDEPENDENT_AMBULATORY_CARE_PROVIDER_SITE_OTHER): Payer: Medicare Other | Admitting: Internal Medicine

## 2019-06-10 ENCOUNTER — Other Ambulatory Visit: Payer: Self-pay

## 2019-06-10 ENCOUNTER — Encounter: Payer: Self-pay | Admitting: Internal Medicine

## 2019-06-10 VITALS — BP 122/70 | HR 70 | Temp 97.9°F | Ht 65.0 in | Wt 202.8 lb

## 2019-06-10 DIAGNOSIS — E782 Mixed hyperlipidemia: Secondary | ICD-10-CM

## 2019-06-10 DIAGNOSIS — N402 Nodular prostate without lower urinary tract symptoms: Secondary | ICD-10-CM

## 2019-06-10 DIAGNOSIS — N182 Chronic kidney disease, stage 2 (mild): Secondary | ICD-10-CM

## 2019-06-10 DIAGNOSIS — I10 Essential (primary) hypertension: Secondary | ICD-10-CM | POA: Diagnosis not present

## 2019-06-10 DIAGNOSIS — H6123 Impacted cerumen, bilateral: Secondary | ICD-10-CM | POA: Insufficient documentation

## 2019-06-10 DIAGNOSIS — H903 Sensorineural hearing loss, bilateral: Secondary | ICD-10-CM | POA: Insufficient documentation

## 2019-06-10 DIAGNOSIS — I728 Aneurysm of other specified arteries: Secondary | ICD-10-CM | POA: Insufficient documentation

## 2019-06-10 DIAGNOSIS — E1122 Type 2 diabetes mellitus with diabetic chronic kidney disease: Secondary | ICD-10-CM | POA: Diagnosis not present

## 2019-06-10 MED ORDER — FREESTYLE LIBRE 14 DAY SENSOR MISC: 1.0000 | 5 refills | Status: DC

## 2019-06-10 MED ORDER — FREESTYLE LIBRE 14 DAY READER DEVI: 1.0000 | 5 refills | Status: DC

## 2019-06-10 NOTE — Patient Instructions (Signed)
We would like to have a copy of the health care power of attorney on file.  I do not see it.

## 2019-06-10 NOTE — Progress Notes (Signed)
Location:  [facility] clinic  Provider: Dr. Hollace Kinnier  Code Status:   Advanced Directives 09/12/2018  Does Patient Have a Medical Advance Directive? Yes  Type of Advance Directive [place]  Does patient want to make changes to medical advance directive? -  Copy of [place] in Chart? -     Chief Complaint  Patient presents with  . Medical Management of Chronic Issues    28-month followup for medication management    HPI: Patient is a 78 y.o. male seen today for medical management of chronic diseases.    Labs reviewed with patient.   He just had his annual Medicare wellness visit. He states he is doing well. He has not complaints.   He takes his diabetes medicine daily. He also checks his sugars daily. He avoids sugar and sweets in his diet. Prefers meats and vegetables in his daily diet.  During the day he drinks milk, a fruit punch that is sugar free and water.   Eating three meals a day.   Ambulates with no issues. Does not use assistive device. Denies any falls or injuries.   He takes his cholesterol medication every day. He tries to avoid fatty foods or fried food. Denies any muscle aching.   Takes his Flomax daily. Still having nocturia, but fewer episodes than before.  He goes about two times a night.   Takes his lisinopril daily. Denies cough, headache or dizziness. Limits salt in his diet.   Back is still bothering him. The pain is about the same from last time. He is still using the voltaren cream, but does not think it is effective.   Needs a refill on on Flomax. Interested in switching to freestyle libra.   Had eyes checked in June 2020   Past Medical History:  Diagnosis Date  . Arthritis   . Chronic kidney disease    CKD- Stage  II  . Colon cancer ([place])    2014  . Colon polyps    adenomatous  . Diabetes mellitus without complication ([place])   . Eczema   . Hypercholesteremia   . Hypertension     Past Surgical  History:  Procedure Laterality Date  . COLON SURGERY  2014 & 2015   resection of colon & removal of poylp ; Dr Kathleene Hazel, Dr Sherlene Shams  . COLONOSCOPY  09/29/2013  . COLONOSCOPY WITH PROPOFOL N/A 03/10/2015   Procedure: COLONOSCOPY WITH PROPOFOL;  Surgeon: Inda Castle, MD;  [facility];  Service: Endoscopy;  Laterality: N/A;  . EXTERNAL EAR SURGERY  1954  . FLEXIBLE SIGMOIDOSCOPY N/A 07/21/2014   Procedure: FLEXIBLE SIGMOIDOSCOPY;  Surgeon: Inda Castle, MD;  Location: [facility];  Service: Endoscopy;  Laterality: N/A;    No Known Allergies  Outpatient Encounter Medications as of 06/10/2019  Medication Sig  . atorvastatin (LIPITOR) 80 MG tablet TAKE ONE TABLET BY MOUTH DAILY  . clotrimazole-betamethasone (LOTRISONE) cream Apply 1 application topically 2 (two) times daily.  . diclofenac sodium (VOLTAREN) 1 % GEL Apply 4 g topically 4 (four) times daily. To lower back  . ferrous sulfate 325 (65 FE) MG tablet Take 325 mg by mouth daily.  . finasteride (PROSCAR) 5 MG tablet TAKE ONE TABLET BY MOUTH DAILY  . glipiZIDE (GLUCOTROL XL) 5 MG 24 hr tablet TAKE ONE TABLET BY MOUTH EVERY MORNING WITH BREAKFAST  . glucose blood (ONE TOUCH ULTRA TEST) test strip USE TO CHECK BLOOD SUGARS ONCE DAILY AS DIRECTED FOR DIABETES  E11.22  . Lancets (ONETOUCH DELICA PLUS 123XX123) MISC USE UP TO 4 TIMES A DAY AS DIRECTED  . lisinopril (ZESTRIL) 5 MG tablet TAKE ONE TABLET BY MOUTH DAILY  . loratadine (CLARITIN) 10 MG tablet Take 1 tablet (10 mg total) by mouth daily.  . meclizine (ANTIVERT) 12.5 MG tablet Take 1 tablet (12.5 mg total) by mouth 3 (three) times daily as needed for dizziness.  . metFORMIN (GLUCOPHAGE) 1000 MG tablet Take 1 tablet (1,000 mg total) by mouth daily with breakfast.  . tamsulosin (FLOMAX) 0.4 MG CAPS capsule TAKE ONE CAPSULE BY MOUTH DAILY  . TRADJENTA 5 MG TABS tablet TAKE ONE TABLET BY MOUTH DAILY  . zinc sulfate 220 (50 Zn) MG capsule Take 220 mg by mouth daily.    No facility-administered encounter medications on file as of 06/10/2019.     Review of Systems:  Review of Systems  Constitutional: Negative for activity change, appetite change and fatigue.  HENT: Positive for rhinorrhea. Negative for dental problem, hearing loss and trouble swallowing.   Eyes: Negative for photophobia and visual disturbance.  Respiratory: Negative for cough, shortness of breath and wheezing.   Cardiovascular: Negative for chest pain and leg swelling.  Gastrointestinal: Negative for constipation, diarrhea and nausea.  Endocrine: Negative for polydipsia, polyphagia and polyuria.  Genitourinary: Positive for frequency. Negative for dysuria and hematuria.  Musculoskeletal: Positive for back pain. Negative for arthralgias and joint swelling.  Skin: Negative.   Neurological: Negative for dizziness, weakness and headaches.  Psychiatric/Behavioral: Negative for dysphoric mood and sleep disturbance. The patient is not nervous/anxious.   All other systems reviewed and are negative.   Health Maintenance  Topic Date Due  . FOOT EXAM  01/09/2019  . OPHTHALMOLOGY EXAM  10/31/2019  . HEMOGLOBIN A1C  12/03/2019  . TETANUS/TDAP  02/08/2023  . INFLUENZA VACCINE  Completed  . PNA vac Low Risk Adult  Completed    Physical Exam: There were no vitals filed for this visit. There is no height or weight on file to calculate BMI. Physical Exam Vitals signs reviewed.  Constitutional:      Appearance: Normal appearance.  HENT:     Head: Normocephalic.     Right Ear: There is impacted cerumen.     Left Ear: There is impacted cerumen.     Nose: Rhinorrhea present.     Mouth/Throat:     Mouth: Mucous membranes are moist.     Pharynx: No posterior oropharyngeal erythema.  Cardiovascular:     Rate and Rhythm: Normal rate and regular rhythm.     Pulses: Normal pulses.     Heart sounds: Normal heart sounds. No murmur.  Pulmonary:     Effort: Pulmonary effort is normal. No  respiratory distress.     Breath sounds: Normal breath sounds. No wheezing.  Abdominal:     General: Bowel sounds are normal. There is distension.     Palpations: Abdomen is soft.     Tenderness: There is no abdominal tenderness.  Musculoskeletal:     Right lower leg: No edema.     Left lower leg: No edema.  Skin:    General: Skin is warm and dry.     Capillary Refill: Capillary refill takes less than 2 seconds.  Neurological:     General: No focal deficit present.     Mental Status: He is alert and oriented to person, place, and time.     Motor: No weakness.     Gait: Gait normal.  Psychiatric:  Mood and Affect: Mood normal.        Behavior: Behavior normal.        Thought Content: Thought content normal.        Judgment: Judgment normal.     Labs reviewed: Basic Metabolic Panel: Recent Labs    09/12/18 1906 01/28/19 0804 06/05/19 0804  NA 137 140 139  K 4.5 4.8 4.6  CL 104 105 104  CO2 25 27 27   GLUCOSE 120* 117* 98  BUN 32* 28* 32*  CREATININE 1.29* 1.21* 1.34*  CALCIUM 9.2 9.2 9.6   Liver Function Tests: Recent Labs    09/14/18 1122 01/28/19 0804 06/05/19 0804  AST 28 20 18   ALT 32 18 16  BILITOT 0.9 0.7 0.8  PROT 6.9 6.8 7.3   No results for input(s): LIPASE, AMYLASE in the last 8760 hours. No results for input(s): AMMONIA in the last 8760 hours. CBC: Recent Labs    09/12/18 1906 09/14/18 1122 01/28/19 0804  WBC 13.1* 8.1 6.9  NEUTROABS  --  5,468 4,678  HGB 13.4 13.4 13.2  HCT 42.7 39.9 39.9  MCV 90.3 86.2 88.9  PLT 163 172 165   Lipid Panel: Recent Labs    01/28/19 0804 06/05/19 0804  CHOL 117 116  HDL 43 40  LDLCALC 54 58  TRIG 115 98  CHOLHDL 2.7 2.9   Lab Results  Component Value Date   HGBA1C 6.6 (H) 06/05/2019    Procedures since last visit: No results found.  Assessment/Plan 1. 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 Splenic artery aneurysm (HCC) - stable, low chance of rupture and does not require further monitoring at this time  3. Bilateral impacted cerumen - unable to see TM in right and left ear due to cerumen buildup - flush both ears today - recommend purchasing Debrox drops OTC to prevent future build-up  4. Essential hypertension, benign - stable, bp at goal <150/90 - continue current medication regimen - continue to limit salt in diet -complete blood count with differential/platelets-future - complete metabolic panel with GFR- future  5. Mixed hyperlipidemia - cholesterol at goal with statin - continue current statin dosage - continue to limit foods high in fat or fried food  6. Prostate nodule without urinary obstruction - followed by urology - nocturia stable, unchanged from last visit - continue current medication regimen   Labs/tests ordered: complete blood count with platelets, complete metabolic panel with GFR, hemoglobin A1C- future Next appt:  3 month follow uo

## 2019-07-03 ENCOUNTER — Other Ambulatory Visit: Payer: Self-pay | Admitting: Internal Medicine

## 2019-07-16 ENCOUNTER — Telehealth: Payer: Self-pay

## 2019-07-16 NOTE — Telephone Encounter (Signed)
Incoming fax received from [employer] indicating [employer] 14 Day Sensor is not covered by insurance.  A list of formulary alternatives were provided:  Dexcom G6 Sensor Dexcom G6 Receiver Dexcom G6 Transmitter Freestyle Libre 10 day reader [employer] 2 Reader Dexcom G5-G4 Sensor  I called patients sister Fraser Din and she indicated that she will follow-up with Kristopher Oppenheim to see how much supplies cost without insurance and call back to decided if they would like alternative or if they are willing to pay out of pocket

## 2019-07-18 NOTE — Telephone Encounter (Signed)
Left message on voicemail for patient to return call when available.  Reason for call: check status of decision to pay out of pocket or have Dr.Reed prescribe an alternative

## 2019-07-24 NOTE — Telephone Encounter (Signed)
Left detailed message on voicemail informing patients sister that we will close encounter due to no return call and if she would like to discuss getting a different meter in the future to please call and speak with clinical intake

## 2019-08-09 ENCOUNTER — Ambulatory Visit (INDEPENDENT_AMBULATORY_CARE_PROVIDER_SITE_OTHER): Payer: Medicare Other | Admitting: Family

## 2019-08-09 ENCOUNTER — Other Ambulatory Visit: Payer: Self-pay

## 2019-08-09 ENCOUNTER — Encounter: Payer: Self-pay | Admitting: Family

## 2019-08-09 VITALS — BP 130/76 | HR 78 | Temp 97.3°F | Ht 65.0 in | Wt 201.2 lb

## 2019-08-09 DIAGNOSIS — H6123 Impacted cerumen, bilateral: Secondary | ICD-10-CM

## 2019-08-09 NOTE — Progress Notes (Signed)
Provider: Balinda Heacock FNP-C  Gayland Curry, DO  Patient Care Team: Alferd Apa as PCP - General (Geriatric Medicine)  Extended Emergency Contact Information Primary Emergency Contact: Debeer,Patricia Address: [address]          [place], [place] [address] [place] Phone: [phone] Relation: Sister  Code Status:  Full Code  Goals of care: Advanced Directive information Advanced Directives 08/09/2019  Does Patient Have a Medical Advance Directive? Yes  Type of Paramedic of [place];Living will  Does patient want to make changes to medical advance directive? No - Patient declined  Copy of [place] in Chart? Yes - validated most recent copy scanned in chart (See row information)  Would patient like information on creating a medical advance directive? -     Chief Complaint  Patient presents with  . Acute Visit    Patient c/o impacted ears he believes left ear but states could be both    HPI:  Pt is a 79 y.o. male seen today  for an acute visit for bilateral ear cerumen removal.He denies any pain or ringing in the ears.No signs or symptoms of upper respiratory infection.unclear if he used debrox ear drops as instructed by MD in 06/2019 visit.    Past Medical History:  Diagnosis Date  . Arthritis   . Chronic kidney disease    CKD- Stage  II  . Colon cancer ([place])    2014  . Colon polyps    adenomatous  . Diabetes mellitus without complication ([place])   . Eczema   . Hypercholesteremia   . Hypertension    Past Surgical History:  Procedure Laterality Date  . COLON SURGERY  2014 & 2015   resection of colon & removal of poylp ; Dr Kathleene Hazel, Dr Sherlene Shams  . COLONOSCOPY  09/29/2013  . COLONOSCOPY WITH PROPOFOL N/A 03/10/2015   Procedure: COLONOSCOPY WITH PROPOFOL;  Surgeon: Inda Castle, MD;  Location: [facility];  Service: Endoscopy;  Laterality: N/A;  . EXTERNAL EAR SURGERY  1954  . FLEXIBLE  SIGMOIDOSCOPY N/A 07/21/2014   Procedure: FLEXIBLE SIGMOIDOSCOPY;  Surgeon: Inda Castle, MD;  Location: [facility];  Service: Endoscopy;  Laterality: N/A;    No Known Allergies  Outpatient Encounter Medications as of 08/09/2019  Medication Sig  . atorvastatin (LIPITOR) 80 MG tablet TAKE ONE TABLET BY MOUTH DAILY  . clotrimazole-betamethasone (LOTRISONE) cream Apply 1 application topically 2 (two) times daily.  . Continuous Blood Gluc Receiver (FREESTYLE LIBRE 14 DAY READER) DEVI 1 Device by Does not apply route every 14 (fourteen) days.  . Continuous Blood Gluc Sensor (FREESTYLE LIBRE 14 DAY SENSOR) MISC 1 Device by Does not apply route every 14 (fourteen) days.  . diclofenac sodium (VOLTAREN) 1 % GEL Apply 4 g topically 4 (four) times daily. To lower back  . ferrous sulfate 325 (65 FE) MG tablet Take 325 mg by mouth daily.  . finasteride (PROSCAR) 5 MG tablet TAKE ONE TABLET BY MOUTH DAILY  . glipiZIDE (GLUCOTROL XL) 5 MG 24 hr tablet TAKE ONE TABLET BY MOUTH EVERY MORNING WITH BREAKFAST  . glucose blood (ONE TOUCH ULTRA TEST) test strip USE TO CHECK BLOOD SUGARS ONCE DAILY AS DIRECTED FOR DIABETES E11.22  . Lancets (ONETOUCH DELICA PLUS 123XX123) MISC USE UP TO 4 TIMES A DAY AS DIRECTED  . lisinopril (ZESTRIL) 5 MG tablet TAKE ONE TABLET BY MOUTH DAILY  . loratadine (CLARITIN) 10 MG tablet Take 1 tablet (10  mg total) by mouth daily.  . meclizine (ANTIVERT) 12.5 MG tablet Take 1 tablet (12.5 mg total) by mouth 3 (three) times daily as needed for dizziness.  . metFORMIN (GLUCOPHAGE) 1000 MG tablet Take 1 tablet (1,000 mg total) by mouth daily with breakfast.  . tamsulosin (FLOMAX) 0.4 MG CAPS capsule TAKE ONE CAPSULE BY MOUTH DAILY  . TRADJENTA 5 MG TABS tablet TAKE ONE TABLET BY MOUTH DAILY  . zinc sulfate 220 (50 Zn) MG capsule Take 220 mg by mouth daily.   No facility-administered encounter medications on file as of 08/09/2019.    Review of Systems  Constitutional: Negative for  chills, fatigue and fever.  HENT: Positive for hearing loss. Negative for congestion, ear discharge, ear pain, rhinorrhea, sinus pressure, sinus pain, sneezing and sore throat.   Respiratory: Negative for cough, chest tightness, shortness of breath and wheezing.   Cardiovascular: Negative for chest pain, palpitations and leg swelling.  Skin: Negative for color change, pallor and rash.  Neurological: Negative for dizziness, light-headedness and headaches.  Psychiatric/Behavioral: Negative for agitation and confusion.    Immunization History  Administered Date(s) Administered  . Fluad Quad(high Dose 65+) 03/25/2019  . Influenza, High Dose Seasonal PF 04/27/2017, 04/30/2018  . Influenza,inj,Quad PF,6+ Mos 05/14/2015, 06/03/2016  . Influenza-Unspecified 05/26/2011, 08/15/2012, 05/01/2013  . Pneumococcal Conjugate-13 08/13/2015  . Pneumococcal Polysaccharide-23 03/12/2009  . Tdap 02/07/2013  . Zoster 01/03/2016   Pertinent  Health Maintenance Due  Topic Date Due  . FOOT EXAM  01/09/2019  . OPHTHALMOLOGY EXAM  10/31/2019  . HEMOGLOBIN A1C  12/03/2019  . INFLUENZA VACCINE  Completed  . PNA vac Low Risk Adult  Completed   Fall Risk  08/09/2019 06/10/2019 05/28/2019 03/25/2019 02/28/2019  Falls in the past year? 0 0 0 0 0  Number falls in past yr: 0 - 0 0 0  Comment - - - - -  Injury with Fall? 0 - 0 0 0  Comment - - - - -    Vitals:   08/09/19 0820  BP: 130/76  Pulse: 78  Temp: (!) 97.3 F (36.3 C)  TempSrc: Temporal  SpO2: 93%  Weight: 201 lb 3.2 oz (91.3 kg)  Height: 5\' 5"  (1.651 m)   Body mass index is 33.48 kg/m. Physical Exam Vitals reviewed.  Constitutional:      General: He is not in acute distress.    Appearance: He is obese. He is not ill-appearing.  HENT:     Right Ear: There is impacted cerumen.     Left Ear: There is impacted cerumen.     Ears:     Comments: Bilateral ear lavaged with warm water.large amounts of cerumen obtained from right ear.Moderate amounts  of cerumen removed from left ear  using alligator forceps.patient tolerated procedure well.Bilateral TM without any signs of infections.     Nose: No congestion or rhinorrhea.  Cardiovascular:     Rate and Rhythm: Normal rate and regular rhythm.     Pulses: Normal pulses.     Heart sounds: Normal heart sounds. No murmur. No friction rub. No gallop.   Pulmonary:     Effort: Pulmonary effort is normal. No respiratory distress.     Breath sounds: Normal breath sounds. No wheezing, rhonchi or rales.  Chest:     Chest wall: No tenderness.  Skin:    General: Skin is warm and dry.     Coloration: Skin is not pale.     Findings: No erythema.  Neurological:  Mental Status: He is alert. Mental status is at baseline.     Motor: No weakness.     Gait: Gait normal.     Comments: HOH   Psychiatric:        Mood and Affect: Mood normal.        Behavior: Behavior normal.        Thought Content: Thought content normal.        Judgment: Judgment normal.    Labs reviewed: Recent Labs    09/12/18 1906 01/28/19 0804 06/05/19 0804  NA 137 140 139  K 4.5 4.8 4.6  CL 104 105 104  CO2 25 27 27   GLUCOSE 120* 117* 98  BUN 32* 28* 32*  CREATININE 1.29* 1.21* 1.34*  CALCIUM 9.2 9.2 9.6   Recent Labs    09/14/18 1122 01/28/19 0804 06/05/19 0804  AST 28 20 18   ALT 32 18 16  BILITOT 0.9 0.7 0.8  PROT 6.9 6.8 7.3   Recent Labs    09/12/18 1906 09/14/18 1122 01/28/19 0804  WBC 13.1* 8.1 6.9  NEUTROABS  --  5,468 4,678  HGB 13.4 13.4 13.2  HCT 42.7 39.9 39.9  MCV 90.3 86.2 88.9  PLT 163 172 165   No results found for: TSH Lab Results  Component Value Date   HGBA1C 6.6 (H) 06/05/2019   Lab Results  Component Value Date   CHOL 116 06/05/2019   HDL 40 06/05/2019   LDLCALC 58 06/05/2019   TRIG 98 06/05/2019   CHOLHDL 2.9 06/05/2019    Significant Diagnostic Results in last 30 days:  No results found.  Assessment/Plan  Bilateral impacted cerumen Afebrile.Bilateral ear  lavaged with warm water.large amounts of cerumen obtained from right ear.Moderate amounts of cerumen removed from left ear  using alligator forceps.patient tolerated procedure well.Bilateral TM without any signs of infections.   Family/ staff Communication: Reviewed plan of care with patient   Labs/tests ordered: None   Next appointment: Has upcoming appointment on 09/08/2018 with Dr.Reed   Sandrea Hughs, NP

## 2019-08-09 NOTE — Addendum Note (Signed)
Addended by: Ruthell Rummage A on: 08/09/2019 09:51 AM   Modules accepted: Orders

## 2019-08-09 NOTE — Patient Instructions (Signed)
Ear wax removed no signs of infection noted in both ears.Notify provider if you develop any signs of infection or pain .

## 2019-09-02 ENCOUNTER — Other Ambulatory Visit: Payer: Self-pay | Admitting: Internal Medicine

## 2019-09-04 ENCOUNTER — Other Ambulatory Visit: Payer: Medicare Other

## 2019-09-04 ENCOUNTER — Other Ambulatory Visit: Payer: Self-pay

## 2019-09-04 DIAGNOSIS — I10 Essential (primary) hypertension: Secondary | ICD-10-CM

## 2019-09-04 DIAGNOSIS — E1122 Type 2 diabetes mellitus with diabetic chronic kidney disease: Secondary | ICD-10-CM | POA: Diagnosis not present

## 2019-09-04 DIAGNOSIS — N182 Chronic kidney disease, stage 2 (mild): Secondary | ICD-10-CM | POA: Diagnosis not present

## 2019-09-05 LAB — HEMOGLOBIN A1C
Hgb A1c MFr Bld: 6.7 % of total Hgb — ABNORMAL HIGH (ref <5.7)
Mean Plasma Glucose: 146 (calc)
eAG (mmol/L): 8.1 (calc)

## 2019-09-05 LAB — CBC WITH DIFFERENTIAL/PLATELET
Absolute Monocytes: 838 cells/uL (ref 200–950)
Basophils Absolute: 43 cells/uL (ref 0–200)
Basophils Relative: 0.6 %
Eosinophils Absolute: 213 cells/uL (ref 15–500)
Eosinophils Relative: 3 %
HCT: 39.6 % (ref 38.5–50.0)
Hemoglobin: 13.3 g/dL (ref 13.2–17.1)
Lymphs Abs: 1590 cells/uL (ref 850–3900)
MCH: 29.4 pg (ref 27.0–33.0)
MCHC: 33.6 g/dL (ref 32.0–36.0)
MCV: 87.6 fL (ref 80.0–100.0)
MPV: 10.3 fL (ref 7.5–12.5)
Monocytes Relative: 11.8 %
Neutro Abs: 4416 cells/uL (ref 1500–7800)
Neutrophils Relative %: 62.2 %
Platelets: 159 10*3/uL (ref 140–400)
RBC: 4.52 10*6/uL (ref 4.20–5.80)
RDW: 12.7 % (ref 11.0–15.0)
Total Lymphocyte: 22.4 %
WBC: 7.1 10*3/uL (ref 3.8–10.8)

## 2019-09-05 LAB — COMPLETE METABOLIC PANEL WITH GFR
AG Ratio: 1.6 (calc) (ref 1.0–2.5)
ALT: 16 U/L (ref 9–46)
AST: 18 U/L (ref 10–35)
Albumin: 4.3 g/dL (ref 3.6–5.1)
Alkaline phosphatase (APISO): 72 U/L (ref 35–144)
BUN/Creatinine Ratio: 21 (calc) (ref 6–22)
BUN: 28 mg/dL — ABNORMAL HIGH (ref 7–25)
CO2: 27 mmol/L (ref 20–32)
Calcium: 9.2 mg/dL (ref 8.6–10.3)
Chloride: 103 mmol/L (ref 98–110)
Creat: 1.34 mg/dL — ABNORMAL HIGH (ref 0.70–1.18)
GFR, Est African American: 58 mL/min/{1.73_m2} — ABNORMAL LOW (ref >=60)
GFR, Est Non African American: 50 mL/min/{1.73_m2} — ABNORMAL LOW (ref >=60)
Globulin: 2.7 g/dL (calc) (ref 1.9–3.7)
Glucose, Bld: 118 mg/dL — ABNORMAL HIGH (ref 65–99)
Potassium: 4.4 mmol/L (ref 3.5–5.3)
Sodium: 138 mmol/L (ref 135–146)
Total Bilirubin: 0.7 mg/dL (ref 0.2–1.2)
Total Protein: 7 g/dL (ref 6.1–8.1)

## 2019-09-05 NOTE — Progress Notes (Signed)
Sugar average trended up.  Blood counts are normal.  Kidneys are stable.

## 2019-09-09 ENCOUNTER — Encounter: Payer: Self-pay | Admitting: Internal Medicine

## 2019-09-09 ENCOUNTER — Ambulatory Visit (INDEPENDENT_AMBULATORY_CARE_PROVIDER_SITE_OTHER): Payer: Medicare Other | Admitting: Internal Medicine

## 2019-09-09 ENCOUNTER — Other Ambulatory Visit: Payer: Self-pay

## 2019-09-09 VITALS — BP 110/60 | HR 96 | Temp 97.7°F | Resp 16 | Ht 65.0 in | Wt 202.2 lb

## 2019-09-09 DIAGNOSIS — N402 Nodular prostate without lower urinary tract symptoms: Secondary | ICD-10-CM

## 2019-09-09 DIAGNOSIS — I10 Essential (primary) hypertension: Secondary | ICD-10-CM

## 2019-09-09 DIAGNOSIS — H6121 Impacted cerumen, right ear: Secondary | ICD-10-CM | POA: Diagnosis not present

## 2019-09-09 DIAGNOSIS — E782 Mixed hyperlipidemia: Secondary | ICD-10-CM | POA: Diagnosis not present

## 2019-09-09 DIAGNOSIS — N182 Chronic kidney disease, stage 2 (mild): Secondary | ICD-10-CM

## 2019-09-09 DIAGNOSIS — E1122 Type 2 diabetes mellitus with diabetic chronic kidney disease: Secondary | ICD-10-CM

## 2019-09-09 NOTE — Progress Notes (Signed)
Location:  [facility] clinic  Provider: Dr. Hollace Kinnier  Goals of Care:  Advanced Directives 09/09/2019  Does Patient Have a Medical Advance Directive? Yes  Type of Paramedic of [place];Living will  Does patient want to make changes to medical advance directive? No - Patient declined  Copy of [place] in Chart? Yes - validated most recent copy scanned in chart (See row information)  Would patient like information on creating a medical advance directive? -     Chief Complaint  Patient presents with  . Medical Management of Chronic Issues    3 Month Follow Up/ with Fasting Labs Prior     HPI: Patient is a 79 y.o. male seen today for medical management of chronic diseases.    Labs reviewed with patient.   He is still checking his blood sugars every morning. He states his readings average between 91-125, today was 107. His sister will often prepare his meals or order to-go food for him.    Eats 3 meals a day. Will have popcorn and potato chips between meals. Drinks a few glasses of water daily. He will also drink hawaiian punch and water it down.   Walks with a standard cane. Denies any recent falls or injuries.   He got his first covid vaccine 09/06/2019. No reactions.   Denies any urinary frequency with medication. Will use bathroom occasionally at night.   Will see podiatry next month for infected left toenail.        Past Medical History:  Diagnosis Date  . Arthritis   . Chronic kidney disease    CKD- Stage  II  . Colon cancer ([place])    2014  . Colon polyps    adenomatous  . Diabetes mellitus without complication ([place])   . Eczema   . Hypercholesteremia   . Hypertension     Past Surgical History:  Procedure Laterality Date  . COLON SURGERY  2014 & 2015   resection of colon & removal of poylp ; Dr Kathleene Hazel, Dr Sherlene Shams  . COLONOSCOPY  09/29/2013  . COLONOSCOPY WITH PROPOFOL N/A 03/10/2015   Procedure: COLONOSCOPY WITH  PROPOFOL;  Surgeon: Inda Castle, MD;  Location: [facility];  Service: Endoscopy;  Laterality: N/A;  . EXTERNAL EAR SURGERY  1954  . FLEXIBLE SIGMOIDOSCOPY N/A 07/21/2014   Procedure: FLEXIBLE SIGMOIDOSCOPY;  Surgeon: Inda Castle, MD;  Location: [facility];  Service: Endoscopy;  Laterality: N/A;    No Known Allergies  Outpatient Encounter Medications as of 09/09/2019  Medication Sig  . atorvastatin (LIPITOR) 80 MG tablet TAKE ONE TABLET BY MOUTH DAILY  . clotrimazole-betamethasone (LOTRISONE) cream Apply 1 application topically 2 (two) times daily.  . Continuous Blood Gluc Receiver (FREESTYLE LIBRE 14 DAY READER) DEVI 1 Device by Does not apply route every 14 (fourteen) days.  . Continuous Blood Gluc Sensor (FREESTYLE LIBRE 14 DAY SENSOR) MISC 1 Device by Does not apply route every 14 (fourteen) days.  . diclofenac sodium (VOLTAREN) 1 % GEL Apply 4 g topically 4 (four) times daily. To lower back  . ferrous sulfate 325 (65 FE) MG tablet Take 325 mg by mouth daily.  . finasteride (PROSCAR) 5 MG tablet TAKE ONE TABLET BY MOUTH DAILY  . glipiZIDE (GLUCOTROL XL) 5 MG 24 hr tablet TAKE ONE TABLET BY MOUTH EVERY MORNING WITH BREAKFAST  . glucose blood (ONE TOUCH ULTRA TEST) test strip USE TO CHECK BLOOD SUGARS ONCE DAILY AS DIRECTED FOR DIABETES E11.22  .  Lancets (ONETOUCH DELICA PLUS 123XX123) MISC USE UP TO 4 TIMES A DAY AS DIRECTED  . lisinopril (ZESTRIL) 5 MG tablet TAKE ONE TABLET BY MOUTH DAILY  . loratadine (CLARITIN) 10 MG tablet Take 1 tablet (10 mg total) by mouth daily.  . meclizine (ANTIVERT) 12.5 MG tablet Take 1 tablet (12.5 mg total) by mouth 3 (three) times daily as needed for dizziness.  . metFORMIN (GLUCOPHAGE) 1000 MG tablet Take 1 tablet (1,000 mg total) by mouth daily with breakfast.  . tamsulosin (FLOMAX) 0.4 MG CAPS capsule TAKE ONE CAPSULE BY MOUTH DAILY  . TRADJENTA 5 MG TABS tablet TAKE ONE TABLET BY MOUTH DAILY  . zinc sulfate 220 (50 Zn) MG capsule Take 220 mg  by mouth daily.   No facility-administered encounter medications on file as of 09/09/2019.    Review of Systems:  Review of Systems  Constitutional: Negative for activity change, appetite change and fatigue.  HENT: Positive for hearing loss. Negative for dental problem and trouble swallowing.   Eyes: Negative for photophobia and visual disturbance.  Respiratory: Negative for cough, shortness of breath and wheezing.   Cardiovascular: Negative for chest pain and leg swelling.  Gastrointestinal: Negative for abdominal pain, constipation, diarrhea and nausea.  Endocrine: Negative for polydipsia, polyphagia and polyuria.  Genitourinary: Negative for dysuria, frequency and hematuria.  Musculoskeletal: Negative for arthralgias and joint swelling.  Skin:       Mole on back  Neurological: Negative for dizziness, weakness and headaches.  Psychiatric/Behavioral: Negative for dysphoric mood and sleep disturbance. The patient is not nervous/anxious.     Health Maintenance  Topic Date Due  . FOOT EXAM  01/09/2019  . OPHTHALMOLOGY EXAM  10/31/2019  . HEMOGLOBIN A1C  03/03/2020  . TETANUS/TDAP  02/08/2023  . INFLUENZA VACCINE  Completed  . PNA vac Low Risk Adult  Completed    Physical Exam: Vitals:   09/09/19 1258  BP: 110/60  Pulse: 96  Resp: 16  Temp: 97.7 F (36.5 C)  SpO2: 96%  Weight: 202 lb 3.2 oz (91.7 kg)  Height: 5\' 5"  (1.651 m)   Body mass index is 33.65 kg/m. Physical Exam Vitals reviewed.  Constitutional:      General: He is not in acute distress.    Appearance: Normal appearance. He is not ill-appearing.  HENT:     Head: Normocephalic.     Right Ear: There is impacted cerumen.     Left Ear: There is no impacted cerumen.  Eyes:     Extraocular Movements: Extraocular movements intact.     Pupils: Pupils are equal, round, and reactive to light.  Cardiovascular:     Rate and Rhythm: Normal rate and regular rhythm.     Pulses:          Dorsalis pedis pulses are 1+  on the right side and 1+ on the left side.       Posterior tibial pulses are 2+ on the right side and 2+ on the left side.     Heart sounds: Normal heart sounds. No murmur.  Pulmonary:     Effort: Pulmonary effort is normal. No respiratory distress.     Breath sounds: Normal breath sounds. No wheezing.  Abdominal:     General: Bowel sounds are normal. There is no distension.     Palpations: Abdomen is soft.     Tenderness: There is no abdominal tenderness.  Musculoskeletal:     Right lower leg: No edema.     Left lower leg: No  edema.  Feet:     Right foot:     Skin integrity: Skin integrity normal.     Toenail Condition: Right toenails are abnormally thick.     Left foot:     Skin integrity: Skin integrity normal.     Toenail Condition: Left toenails are abnormally thick. Fungal disease present. Skin:    General: Skin is warm and dry.     Capillary Refill: Capillary refill takes less than 2 seconds.  Neurological:     General: No focal deficit present.     Mental Status: He is alert and oriented to person, place, and time. Mental status is at baseline.  Psychiatric:        Mood and Affect: Mood normal.        Behavior: Behavior normal.        Thought Content: Thought content normal.        Judgment: Judgment normal.     Labs reviewed: Basic Metabolic Panel: Recent Labs    01/28/19 0804 06/05/19 0804 09/04/19 0830  NA 140 139 138  K 4.8 4.6 4.4  CL 105 104 103  CO2 27 27 27   GLUCOSE 117* 98 118*  BUN 28* 32* 28*  CREATININE 1.21* 1.34* 1.34*  CALCIUM 9.2 9.6 9.2   Liver Function Tests: Recent Labs    01/28/19 0804 06/05/19 0804 09/04/19 0830  AST 20 18 18   ALT 18 16 16   BILITOT 0.7 0.8 0.7  PROT 6.8 7.3 7.0   No results for input(s): LIPASE, AMYLASE in the last 8760 hours. No results for input(s): AMMONIA in the last 8760 hours. CBC: Recent Labs    09/14/18 1122 01/28/19 0804 09/04/19 0830  WBC 8.1 6.9 7.1  NEUTROABS 5,468 4,678 4,416  HGB 13.4  13.2 13.3  HCT 39.9 39.9 39.6  MCV 86.2 88.9 87.6  PLT 172 165 159   Lipid Panel: Recent Labs    01/28/19 0804 06/05/19 0804  CHOL 117 116  HDL 43 40  LDLCALC 54 58  TRIG 115 98  CHOLHDL 2.7 2.9   Lab Results  Component Value Date   HGBA1C 6.7 (H) 09/04/2019    Procedures since last visit: No results found.  Assessment/Plan 1. Type 2 diabetes mellitus with stage 2 chronic kidney disease, without long-term current use of insulin (HCC) - A1C is elevated, suspect snacking and sugary drinks are contributing factors - recommend no snacking or snacking on foods low in carbs and sugars - refrain from sugary drinks - continue current glipizide and metformin regimen - hemoglobin A1C- future - lipid panel- future  2. Essential hypertension, benign - bp at goal <150/90 - continue lisinopril for bp control and kidney protection - cbc with differential/platelets- future -complete metabolic panel with GFR- Future  3. Mixed hyperlipidemia - continue current statin regimen - lipid panel- future  4. Prostate nodule without urinary obstruction - followed by urology - nocturia stable with medication - continue current medication regimen   5. Hearing loss of right ear due to cerumen impaction - unable to view TM on right ear  - lavage of right ear today - recommend purchasing Debrox drops OTC to prevent future buildup   Labs/tests ordered:  Cbc with differential/platelets, complete metabolic panel with GFR, lipid panel, hemoglobin A1C- future Next appt:  4 month follow up

## 2019-09-09 NOTE — Patient Instructions (Addendum)
I recommend decreasing the hawaiian punch and potato chips.   Try to eat fruit or nuts for your snacks.  Popcorn occasionally is ok.

## 2019-09-26 ENCOUNTER — Other Ambulatory Visit: Payer: Self-pay

## 2019-09-26 NOTE — Patient Outreach (Signed)
[facility]) Care Management  09/26/2019  Justin [name] Mar 06, 1941 [id number]   Medication Adherence call to Mr. [name] HIPPA Compliant Voice message left with a call back number. Mr. [name] is showing past due on Tradjenta 5 mg under [name].  [place] Management Direct Dial [phone]  Fax [phone] [email or web address]

## 2019-10-05 ENCOUNTER — Other Ambulatory Visit: Payer: Self-pay | Admitting: Internal Medicine

## 2019-10-05 DIAGNOSIS — E785 Hyperlipidemia, unspecified: Secondary | ICD-10-CM

## 2019-10-05 DIAGNOSIS — E1169 Type 2 diabetes mellitus with other specified complication: Secondary | ICD-10-CM

## 2019-10-07 NOTE — Telephone Encounter (Signed)
Refilled on 10/08/2019

## 2019-10-09 ENCOUNTER — Other Ambulatory Visit: Payer: Self-pay | Admitting: Internal Medicine

## 2019-10-09 DIAGNOSIS — E1169 Type 2 diabetes mellitus with other specified complication: Secondary | ICD-10-CM

## 2019-10-09 DIAGNOSIS — E785 Hyperlipidemia, unspecified: Secondary | ICD-10-CM

## 2019-10-15 ENCOUNTER — Encounter: Payer: Self-pay | Admitting: Podiatry

## 2019-10-15 ENCOUNTER — Other Ambulatory Visit: Payer: Self-pay

## 2019-10-15 ENCOUNTER — Ambulatory Visit: Payer: Medicare Other | Admitting: Podiatry

## 2019-10-15 DIAGNOSIS — M2012 Hallux valgus (acquired), left foot: Secondary | ICD-10-CM

## 2019-10-15 DIAGNOSIS — M2011 Hallux valgus (acquired), right foot: Secondary | ICD-10-CM

## 2019-10-15 DIAGNOSIS — M79674 Pain in right toe(s): Secondary | ICD-10-CM | POA: Diagnosis not present

## 2019-10-15 DIAGNOSIS — E1122 Type 2 diabetes mellitus with diabetic chronic kidney disease: Secondary | ICD-10-CM

## 2019-10-15 DIAGNOSIS — R42 Dizziness and giddiness: Secondary | ICD-10-CM | POA: Insufficient documentation

## 2019-10-15 DIAGNOSIS — E119 Type 2 diabetes mellitus without complications: Secondary | ICD-10-CM | POA: Diagnosis not present

## 2019-10-15 DIAGNOSIS — M79675 Pain in left toe(s): Secondary | ICD-10-CM | POA: Diagnosis not present

## 2019-10-15 DIAGNOSIS — N182 Chronic kidney disease, stage 2 (mild): Secondary | ICD-10-CM

## 2019-10-15 DIAGNOSIS — B351 Tinea unguium: Secondary | ICD-10-CM | POA: Diagnosis not present

## 2019-10-15 NOTE — Progress Notes (Signed)
Subjective: Justin [name] presents today for follow up of preventative diabetic foot care, for diabetic foot evaluation and painful mycotic nails b/l that are difficult to trim. Pain interferes with ambulation. Aggravating factors include wearing enclosed shoe gear. Pain is relieved with periodic professional debridement.   No Known Allergies   Objective: There were no vitals filed for this visit.  Pt 79 y.o. year old Caucasian  male  WD, WN in NAD. AAO x 3.   Vascular Examination:  Capillary refill time to digits immediate b/l. Palpable DP pulses b/l. Palpable PT pulses b/l. Pedal hair present b/l. Skin temperature gradient within normal limits b/l.  Dermatological Examination: Pedal skin with normal turgor, texture and tone bilaterally. No open wounds bilaterally. No interdigital macerations bilaterally. Toenails 1-5 b/l elongated, dystrophic, thickened, crumbly with subungual debris and tenderness to dorsal palpation.  Musculoskeletal: Normal muscle strength 5/5 to all lower extremity muscle groups bilaterally, no pain crepitus or joint limitation noted with ROM b/l and HAV with bunion deformity right > left  Neurological: Protective sensation intact 5/5 intact bilaterally with 10g monofilament b/l Vibratory sensation intact b/l  Assessment: 1. 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 Hallux valgus, acquired, bilateral   3. Type 2 diabetes mellitus with stage 2 chronic kidney disease, without long-term current use of insulin ([place])   4. Encounter for diabetic foot exam ([place])    Plan: -Diabetic foot examination performed on today's visit. -Continue diabetic foot care principles. Literature dispensed on today.  -Toenails 1-5 b/l were debrided in length and girth with sterile nail nippers and dremel without iatrogenic bleeding.  -Recommended 3 month for follow up, but patient states he will call and schedule follow up appointment. -Patient to continue soft, supportive  shoe gear daily. -Patient to report any pedal injuries to medical professional immediately. -Patient/POA to call should there be question/concern in the interim.  No follow-ups on file.

## 2019-10-16 ENCOUNTER — Other Ambulatory Visit: Payer: Self-pay | Admitting: Internal Medicine

## 2019-10-19 ENCOUNTER — Other Ambulatory Visit: Payer: Self-pay | Admitting: Internal Medicine

## 2019-10-21 NOTE — Telephone Encounter (Signed)
rx sent to pharmacy by e-script  

## 2019-11-26 ENCOUNTER — Other Ambulatory Visit: Payer: Self-pay | Admitting: Internal Medicine

## 2019-11-26 NOTE — Telephone Encounter (Signed)
rx sent to pharmacy by e-script  

## 2019-11-28 ENCOUNTER — Other Ambulatory Visit: Payer: Self-pay | Admitting: Internal Medicine

## 2019-11-28 NOTE — Telephone Encounter (Signed)
Refill sent to pharmacy.   

## 2019-12-23 ENCOUNTER — Other Ambulatory Visit: Payer: Self-pay

## 2019-12-23 ENCOUNTER — Ambulatory Visit (INDEPENDENT_AMBULATORY_CARE_PROVIDER_SITE_OTHER): Payer: Medicare Other | Admitting: Family

## 2019-12-23 ENCOUNTER — Encounter: Payer: Self-pay | Admitting: Family

## 2019-12-23 VITALS — BP 114/70 | HR 67 | Temp 98.0°F | Resp 16 | Ht 65.0 in | Wt 181.8 lb

## 2019-12-23 DIAGNOSIS — H6123 Impacted cerumen, bilateral: Secondary | ICD-10-CM

## 2019-12-23 NOTE — Patient Instructions (Signed)
Earwax Buildup, Adult The ears produce a substance called earwax that helps keep bacteria out of the ear and protects the skin in the ear canal. Occasionally, earwax can build up in the ear and cause discomfort or hearing loss. What increases the risk? This condition is more likely to develop in people who:  Are male.  Are elderly.  Naturally produce more earwax.  Clean their ears often with cotton swabs.  Use earplugs often.  Use in-ear headphones often.  Wear hearing aids.  Have narrow ear canals.  Have earwax that is overly thick or sticky.  Have eczema.  Are dehydrated.  Have excess hair in the ear canal. What are the signs or symptoms? Symptoms of this condition include:  Reduced or muffled hearing.  A feeling of fullness in the ear or feeling that the ear is plugged.  Fluid coming from the ear.  Ear pain.  Ear itch.  Ringing in the ear.  Coughing.  An obvious piece of earwax that can be seen inside the ear canal. How is this diagnosed? This condition may be diagnosed based on:  Your symptoms.  Your medical history.  An ear exam. During the exam, your health care provider will look into your ear with an instrument called an otoscope. You may have tests, including a hearing test. How is this treated? This condition may be treated by:  Using ear drops to soften the earwax.  Having the earwax removed by a health care provider. The health care provider may: ? Flush the ear with water. ? Use an instrument that has a loop on the end (curette). ? Use a suction device.  Surgery to remove the wax buildup. This may be done in severe cases. Follow these instructions at home:   Take over-the-counter and prescription medicines only as told by your health care provider.  Do not put any objects, including cotton swabs, into your ear. You can clean the opening of your ear canal with a washcloth or facial tissue.  Follow instructions from your health care  provider about cleaning your ears. Do not over-clean your ears.  Drink enough fluid to keep your urine clear or pale yellow. This will help to thin the earwax.  Keep all follow-up visits as told by your health care provider. If earwax builds up in your ears often or if you use hearing aids, consider seeing your health care provider for routine, preventive ear cleanings. Ask your health care provider how often you should schedule your cleanings.  If you have hearing aids, clean them according to instructions from the manufacturer and your health care provider. Contact a health care provider if:  You have ear pain.  You develop a fever.  You have blood, pus, or other fluid coming from your ear.  You have hearing loss.  You have ringing in your ears that does not go away.  Your symptoms do not improve with treatment.  You feel like the room is spinning (vertigo). Summary  Earwax can build up in the ear and cause discomfort or hearing loss.  The most common symptoms of this condition include reduced or muffled hearing and a feeling of fullness in the ear or feeling that the ear is plugged.  This condition may be diagnosed based on your symptoms, your medical history, and an ear exam.  This condition may be treated by using ear drops to soften the earwax or by having the earwax removed by a health care provider.  Do not put any   objects, including cotton swabs, into your ear. You can clean the opening of your ear canal with a washcloth or facial tissue. This information is not intended to replace advice given to you by your health care provider. Make sure you discuss any questions you have with your health care provider. Document Revised: 06/30/2017 Document Reviewed: 09/28/2016 Elsevier Patient Education  2020 Elsevier Inc.  

## 2019-12-23 NOTE — Progress Notes (Addendum)
Provider: Rhaelyn Giron FNP-C  Gayland Curry, DO  Patient Care Team: Alferd Apa as PCP - General (Geriatric Medicine)  Extended Emergency Contact Information Primary Emergency Contact: Debeer,Patricia Address: [address]          [place],  [address] Johnnette Litter of [place] Phone: [phone] Relation: Sister  Code Status:  Full Code  Goals of care: Advanced Directive information Advanced Directives 12/23/2019  Does Patient Have a Medical Advance Directive? No  Type of Advance Directive -  Does patient want to make changes to medical advance directive? -  Copy of [place] in Chart? -  Would patient like information on creating a medical advance directive? No - Patient declined     Chief Complaint  Patient presents with  . Acute Visit    Ear Cleaning.    HPI:  Pt is a 79 y.o. male seen today for an acute visit for evaluation of both ears.Has had worsening hearing on both ears feels like full of wax.He would like both ears checked.He denies any pain,ringing,itching or drainage from ears.Also denies any fever,chills or cough.  Past Medical History:  Diagnosis Date  . Arthritis   . Chronic kidney disease    CKD- Stage  II  . Colon cancer ([place])    2014  . Colon polyps    adenomatous  . Diabetes mellitus without complication ([place])   . Eczema   . Hypercholesteremia   . Hypertension    Past Surgical History:  Procedure Laterality Date  . COLON SURGERY  2014 & 2015   resection of colon & removal of poylp ; Dr Kathleene Hazel, Dr Sherlene Shams  . COLONOSCOPY  09/29/2013  . COLONOSCOPY WITH PROPOFOL N/A 03/10/2015   Procedure: COLONOSCOPY WITH PROPOFOL;  Surgeon: Inda Castle, MD;  Location: [facility];  Service: Endoscopy;  Laterality: N/A;  . EXTERNAL EAR SURGERY  1954  . FLEXIBLE SIGMOIDOSCOPY N/A 07/21/2014   Procedure: FLEXIBLE SIGMOIDOSCOPY;  Surgeon: Inda Castle, MD;  Location: [facility];  Service: Endoscopy;  Laterality:  N/A;    No Known Allergies  Outpatient Encounter Medications as of 12/23/2019  Medication Sig  . atorvastatin (LIPITOR) 80 MG tablet TAKE ONE TABLET BY MOUTH DAILY  . clotrimazole-betamethasone (LOTRISONE) cream Apply 1 application topically 2 (two) times daily.  . Continuous Blood Gluc Receiver (FREESTYLE LIBRE 14 DAY READER) DEVI 1 Device by Does not apply route every 14 (fourteen) days.  . Continuous Blood Gluc Sensor (FREESTYLE LIBRE 14 DAY SENSOR) MISC 1 Device by Does not apply route every 14 (fourteen) days.  . diclofenac sodium (VOLTAREN) 1 % GEL Apply 4 g topically 4 (four) times daily. To lower back  . ferrous sulfate 325 (65 FE) MG tablet Take 325 mg by mouth daily.  . finasteride (PROSCAR) 5 MG tablet TAKE ONE TABLET BY MOUTH DAILY  . glipiZIDE (GLUCOTROL XL) 5 MG 24 hr tablet TAKE ONE TABLET BY MOUTH EVERY MORNING WITH BREAKFAST  . Lancets (ONETOUCH DELICA PLUS [id number]) MISC USE UP TO 4 TIMES A DAY AS DIRECTED  . lisinopril (ZESTRIL) 5 MG tablet TAKE ONE TABLET BY MOUTH DAILY  . loratadine (CLARITIN) 10 MG tablet Take 1 tablet (10 mg total) by mouth daily.  . meclizine (ANTIVERT) 12.5 MG tablet Take 1 tablet (12.5 mg total) by mouth 3 (three) times daily as needed for dizziness.  . metFORMIN (GLUCOPHAGE) 1000 MG tablet Take 1 tablet (1,000 mg total) by mouth daily with breakfast.  . ONETOUCH ULTRA test  strip USE TO CHECK BLOOD SUGARS ONCE DAILY AS DIRECTED FOR DIABETES  . tamsulosin (FLOMAX) 0.4 MG CAPS capsule TAKE ONE CAPSULE BY MOUTH DAILY  . TRADJENTA 5 MG TABS tablet TAKE ONE TABLET BY MOUTH DAILY  . zinc sulfate 220 (50 Zn) MG capsule Take 220 mg by mouth daily.   No facility-administered encounter medications on file as of 12/23/2019.    Review of Systems  Constitutional: Negative for chills, fatigue and fever.  HENT: Positive for hearing loss. Negative for congestion, postnasal drip, rhinorrhea, sinus pressure, sinus pain, sneezing, sore throat and tinnitus.     Respiratory: Negative for cough, chest tightness, shortness of breath and wheezing.   Neurological: Negative for dizziness, speech difficulty, light-headedness and headaches.    Immunization History  Administered Date(s) Administered  . Fluad Quad(high Dose 65+) 03/25/2019  . Influenza, High Dose Seasonal PF 04/27/2017, 04/30/2018  . Influenza,inj,Quad PF,6+ Mos 05/14/2015, 06/03/2016  . Influenza-Unspecified 05/26/2011, 08/15/2012, 05/01/2013  . Pneumococcal Conjugate-13 08/13/2015  . Pneumococcal Polysaccharide-23 03/12/2009  . Tdap 02/07/2013  . Zoster 01/03/2016   Pertinent  Health Maintenance Due  Topic Date Due  . OPHTHALMOLOGY EXAM  10/31/2019  . INFLUENZA VACCINE  03/01/2020  . HEMOGLOBIN A1C  03/03/2020  . FOOT EXAM  10/14/2020  . PNA vac Low Risk Adult  Completed   Fall Risk  12/23/2019 09/09/2019 08/09/2019 06/10/2019 05/28/2019  Falls in the past year? 0 0 0 0 0  Number falls in past yr: 0 0 0 - 0  Comment - - - - -  Injury with Fall? 0 0 0 - 0  Comment - - - - -    Vitals:   12/23/19 0815  BP: 114/70  Pulse: 67  Resp: 16  Temp: 98 F (36.7 C)  SpO2: 96%  Weight: 181 lb 12.8 oz (82.5 kg)  Height: 5\' 5"  (1.651 m)   Body mass index is 30.25 kg/m. Physical Exam Vitals reviewed.  Constitutional:      General: He is not in acute distress.    Appearance: He is not ill-appearing.  HENT:     Head: Normocephalic.     Right Ear: There is impacted cerumen.     Left Ear: There is impacted cerumen.     Ears:     Comments: Both ears lavaged with warm water and peroxide large amounts of ear cerumen obtained using curette and alligator forceps.Patient tolerated procedure well.TM clear bilaterally.No signs of infection.     Nose: No congestion or rhinorrhea.     Mouth/Throat:     Mouth: Mucous membranes are moist.     Pharynx: Oropharynx is clear. No oropharyngeal exudate or posterior oropharyngeal erythema.  Eyes:     General: No scleral icterus.       Right eye:  No discharge.        Left eye: No discharge.     Conjunctiva/sclera: Conjunctivae normal.     Pupils: Pupils are equal, round, and reactive to light.  Cardiovascular:     Rate and Rhythm: Normal rate and regular rhythm.     Pulses: Normal pulses.     Heart sounds: Normal heart sounds. No murmur. No friction rub. No gallop.   Pulmonary:     Effort: Pulmonary effort is normal. No respiratory distress.     Breath sounds: Normal breath sounds. No wheezing, rhonchi or rales.  Chest:     Chest wall: No tenderness.  Neurological:     Mental Status: He is alert. Mental status is  at baseline.     Motor: No weakness.     Gait: Gait normal.     Comments: HOH  Psychiatric:        Mood and Affect: Mood normal.        Behavior: Behavior normal.        Thought Content: Thought content normal.        Judgment: Judgment normal.     Labs reviewed: Recent Labs    01/28/19 0804 06/05/19 0804 09/04/19 0830  NA 140 139 138  K 4.8 4.6 4.4  CL 105 104 103  CO2 27 27 27   GLUCOSE 117* 98 118*  BUN 28* 32* 28*  CREATININE 1.21* 1.34* 1.34*  CALCIUM 9.2 9.6 9.2   Recent Labs    01/28/19 0804 06/05/19 0804 09/04/19 0830  AST 20 18 18   ALT 18 16 16   BILITOT 0.7 0.8 0.7  PROT 6.8 7.3 7.0   Recent Labs    01/28/19 0804 09/04/19 0830  WBC 6.9 7.1  NEUTROABS 4,678 4,416  HGB 13.2 13.3  HCT 39.9 39.6  MCV 88.9 87.6  PLT 165 159   No results found for: TSH Lab Results  Component Value Date   HGBA1C 6.7 (H) 09/04/2019   Lab Results  Component Value Date   CHOL 116 06/05/2019   HDL 40 06/05/2019   LDLCALC 58 06/05/2019   TRIG 98 06/05/2019   CHOLHDL 2.9 06/05/2019    Significant Diagnostic Results in last 30 days:  No results found.  Assessment/Plan   Bilateral impacted cerumen Afebrile.bilateral cerumen impaction lavaged with warm water and Peroxide large amounts of cerumen obtained using Alligator Forceps and curette.He toelrated procedure well.TM clear on both ears.No  signs of infection noted.  Family/ staff Communication: Reviewed plan of care with patient verbalized understanding.   Labs/tests ordered: None   Next Appointment: Has upcoming appointment 6/ 05/2020 with Dr.Reed.  Sandrea Hughs, NP

## 2020-01-04 ENCOUNTER — Other Ambulatory Visit: Payer: Self-pay | Admitting: Internal Medicine

## 2020-01-06 ENCOUNTER — Other Ambulatory Visit: Payer: Medicare Other

## 2020-01-06 ENCOUNTER — Other Ambulatory Visit: Payer: Self-pay

## 2020-01-06 DIAGNOSIS — N182 Chronic kidney disease, stage 2 (mild): Secondary | ICD-10-CM

## 2020-01-06 DIAGNOSIS — E1122 Type 2 diabetes mellitus with diabetic chronic kidney disease: Secondary | ICD-10-CM | POA: Diagnosis not present

## 2020-01-07 ENCOUNTER — Other Ambulatory Visit: Payer: Self-pay | Admitting: Internal Medicine

## 2020-01-07 LAB — BASIC METABOLIC PANEL
BUN/Creatinine Ratio: 28 (calc) — ABNORMAL HIGH (ref 6–22)
BUN: 38 mg/dL — ABNORMAL HIGH (ref 7–25)
CO2: 26 mmol/L (ref 20–32)
Calcium: 9.3 mg/dL (ref 8.6–10.3)
Chloride: 105 mmol/L (ref 98–110)
Creat: 1.35 mg/dL — ABNORMAL HIGH (ref 0.70–1.18)
Glucose, Bld: 83 mg/dL (ref 65–99)
Potassium: 4.7 mmol/L (ref 3.5–5.3)
Sodium: 139 mmol/L (ref 135–146)

## 2020-01-07 LAB — HEMOGLOBIN A1C
Hgb A1c MFr Bld: 5.6 % of total Hgb (ref <5.7)
Mean Plasma Glucose: 114 (calc)
eAG (mmol/L): 6.3 (calc)

## 2020-01-07 NOTE — Progress Notes (Signed)
Kidneys are stable.   Sugar average is now in normal range down a full 1.1 points from last time I will have to verify that he's not had lows at his appt

## 2020-01-07 NOTE — Telephone Encounter (Signed)
rx sent to pharmacy by e-script  

## 2020-01-09 ENCOUNTER — Encounter: Payer: Self-pay | Admitting: Internal Medicine

## 2020-01-09 ENCOUNTER — Ambulatory Visit (INDEPENDENT_AMBULATORY_CARE_PROVIDER_SITE_OTHER): Payer: Medicare Other | Admitting: Internal Medicine

## 2020-01-09 ENCOUNTER — Other Ambulatory Visit: Payer: Self-pay

## 2020-01-09 VITALS — BP 118/72 | HR 67 | Temp 97.7°F | Ht 65.0 in | Wt 184.0 lb

## 2020-01-09 DIAGNOSIS — E782 Mixed hyperlipidemia: Secondary | ICD-10-CM

## 2020-01-09 DIAGNOSIS — N182 Chronic kidney disease, stage 2 (mild): Secondary | ICD-10-CM

## 2020-01-09 DIAGNOSIS — Z1159 Encounter for screening for other viral diseases: Secondary | ICD-10-CM

## 2020-01-09 DIAGNOSIS — E1122 Type 2 diabetes mellitus with diabetic chronic kidney disease: Secondary | ICD-10-CM | POA: Diagnosis not present

## 2020-01-09 DIAGNOSIS — I10 Essential (primary) hypertension: Secondary | ICD-10-CM | POA: Diagnosis not present

## 2020-01-09 DIAGNOSIS — N402 Nodular prostate without lower urinary tract symptoms: Secondary | ICD-10-CM

## 2020-01-09 DIAGNOSIS — I728 Aneurysm of other specified arteries: Secondary | ICD-10-CM

## 2020-01-09 DIAGNOSIS — H903 Sensorineural hearing loss, bilateral: Secondary | ICD-10-CM

## 2020-01-09 MED ORDER — CLOTRIMAZOLE-BETAMETHASONE 1-0.05 % EX CREA: 1.0000 "application " | TOPICAL_CREAM | Freq: Two times a day (BID) | CUTANEOUS | 3 refills | Status: DC

## 2020-01-09 MED ORDER — ONETOUCH ULTRA VI STRP: ORAL_STRIP | 10 refills | Status: DC

## 2020-01-09 NOTE — Progress Notes (Signed)
Location:  [facility] Provider:  Briyonna Omara L. Mariea Clonts, D.O., C.M.D.  Goals of Care:  Advanced Directives 01/09/2020  Does Patient Have a Medical Advance Directive? Yes  Type of Advance Directive [place]  Does patient want to make changes to medical advance directive? No - Patient declined  Copy of [place] in Chart? -  Would patient like information on creating a medical advance directive? No - Patient declined     Chief Complaint  Patient presents with  . Medical Management of Chronic Issues    4 month follow up     HPI: Patient is a 79 y.o. [name] seen today for medical management of chronic diseases.   hba1c up a little, but still great for him at 6.7.   He's not eating sweets.   Last night CBG 115.   CBGs 89-95, etc.   Sees Dr. Gershon Crane coming up for his eye appt.   Needs his clotrimazole cream for his back refilled. Using [employer] Grippe old machine (his brother-in-law) and needs test strips for it. Back still bothers him at times from arthritis. Bowels are doing ok--gets up once at night for bm sometimes. Otherwise sleeps well.   No other pains. Reports dry skin on arms and asks for treatment.  Past Medical History:  Diagnosis Date  . Arthritis   . Chronic kidney disease    CKD- Stage  II  . Colon cancer ([place])    2014  . Colon polyps    adenomatous  . Diabetes mellitus without complication ([place])   . Eczema   . Hypercholesteremia   . Hypertension     Past Surgical History:  Procedure Laterality Date  . COLON SURGERY  2014 & 2015   resection of colon & removal of poylp ; Dr Kathleene Hazel, Dr Sherlene Shams  . COLONOSCOPY  09/29/2013  . COLONOSCOPY WITH PROPOFOL N/A 03/10/2015   Procedure: COLONOSCOPY WITH PROPOFOL;  Surgeon: Inda Castle, MD;  Location: [facility];  Service: Endoscopy;  Laterality: N/A;  . EXTERNAL EAR SURGERY  1954  . FLEXIBLE SIGMOIDOSCOPY N/A 07/21/2014   Procedure: FLEXIBLE SIGMOIDOSCOPY;  Surgeon: Inda Castle, MD;  Location: [facility];  Service: Endoscopy;  Laterality: N/A;    No Known Allergies  Outpatient Encounter Medications as of 01/09/2020  Medication Sig  . atorvastatin (LIPITOR) 80 MG tablet TAKE ONE TABLET BY MOUTH DAILY  . clotrimazole-betamethasone (LOTRISONE) cream Apply 1 application topically 2 (two) times daily.  . Continuous Blood Gluc Receiver (FREESTYLE LIBRE 14 DAY READER) DEVI 1 Device by Does not apply route every 14 (fourteen) days.  . Continuous Blood Gluc Sensor (FREESTYLE LIBRE 14 DAY SENSOR) MISC 1 Device by Does not apply route every 14 (fourteen) days.  . diclofenac sodium (VOLTAREN) 1 % GEL Apply 4 g topically 4 (four) times daily. To lower back  . ferrous sulfate 325 (65 FE) MG tablet Take 325 mg by mouth daily.  . finasteride (PROSCAR) 5 MG tablet TAKE ONE TABLET BY MOUTH DAILY  . glipiZIDE (GLUCOTROL XL) 5 MG 24 hr tablet TAKE ONE TABLET BY MOUTH EVERY MORNING WITH BREAKFAST  . Lancets (ONETOUCH DELICA PLUS [id number]) MISC USE UP TO 4 TIMES A DAY AS DIRECTED  . lisinopril (ZESTRIL) 5 MG tablet TAKE ONE TABLET BY MOUTH DAILY  . loratadine (CLARITIN) 10 MG tablet Take 1 tablet (10 mg total) by mouth daily.  . meclizine (ANTIVERT) 12.5 MG tablet Take 1 tablet (12.5 mg total) by mouth 3 (three) times  daily as needed for dizziness.  . metFORMIN (GLUCOPHAGE) 1000 MG tablet Take 1 tablet (1,000 mg total) by mouth daily with breakfast.  . ONETOUCH ULTRA test strip USE TO CHECK BLOOD SUGARS ONCE DAILY AS DIRECTED FOR DIABETES  . tamsulosin (FLOMAX) 0.4 MG CAPS capsule TAKE ONE CAPSULE BY MOUTH DAILY  . TRADJENTA 5 MG TABS tablet TAKE ONE TABLET BY MOUTH DAILY  . zinc sulfate 220 (Justin Zn) MG capsule Take 220 mg by mouth daily.   No facility-administered encounter medications on file as of 01/09/2020.    Review of Systems:  Review of Systems  Constitutional: Negative for chills, fever and malaise/fatigue.  HENT: Positive for hearing loss.   Eyes: Negative for  blurred vision.  Respiratory: Negative for cough and shortness of breath.   Cardiovascular: Negative for chest pain, palpitations and leg swelling.  Gastrointestinal: Negative for abdominal pain, blood in stool, constipation, diarrhea and melena.  Genitourinary: Negative for dysuria.  Musculoskeletal: Positive for back pain. Negative for falls, joint pain and myalgias.  Skin: Positive for itching. Negative for rash.       Dry skin of arms  Neurological: Negative for dizziness and loss of consciousness.  Psychiatric/Behavioral: Negative for depression and memory loss. The patient is not nervous/anxious and does not have insomnia.     Health Maintenance  Topic Date Due  . Hepatitis C Screening  Never done  . COVID-19 Vaccine (1) Never done  . OPHTHALMOLOGY EXAM  10/31/2019  . INFLUENZA VACCINE  03/01/2020  . HEMOGLOBIN A1C  07/07/2020  . FOOT EXAM  10/14/2020  . TETANUS/TDAP  02/08/2023  . PNA vac Low Risk Adult  Completed    Physical Exam: Vitals:   01/09/20 1457  BP: 118/72  Pulse: 67  Temp: 97.7 F (36.5 C)  SpO2: 97%  Weight: 184 lb (83.5 kg)  Height: 5\' 5"  (1.651 m)   Body mass index is 30.62 kg/m. Physical Exam Vitals reviewed.  Constitutional:      General: He is not in acute distress.    Appearance: Normal appearance. He is obese. He is not toxic-appearing.  HENT:     Head: Normocephalic and atraumatic.     Ears:     Comments: HOH Eyes:     Comments: Wears dark glasses  Cardiovascular:     Rate and Rhythm: Normal rate and regular rhythm.     Pulses: Normal pulses.     Heart sounds: Normal heart sounds.  Pulmonary:     Effort: Pulmonary effort is normal.     Breath sounds: Normal breath sounds. No wheezing, rhonchi or rales.  Abdominal:     General: Bowel sounds are normal. There is no distension.     Palpations: Abdomen is soft.     Tenderness: There is no abdominal tenderness. There is no guarding or rebound.  Musculoskeletal:        General: Normal  range of motion.     Cervical back: Neck supple.     Right lower leg: No edema.     Left lower leg: No edema.     Comments: Varicose veins of both legs--nontender  Skin:    General: Skin is warm and dry.  Neurological:     General: No focal deficit present.     Mental Status: He is alert and oriented to person, place, and time.     Comments: Wide-based gait  Psychiatric:        Mood and Affect: Mood normal.  Behavior: Behavior normal.        Thought Content: Thought content normal.        Judgment: Judgment normal.     Labs reviewed: Basic Metabolic Panel: Recent Labs    06/05/19 0804 09/04/19 0830 01/06/20 0837  NA 139 138 139  K 4.6 4.4 4.7  CL 104 103 105  CO2 27 27 26   GLUCOSE 98 118* 83  BUN 32* 28* 38*  CREATININE 1.34* 1.34* 1.35*  CALCIUM 9.6 9.2 9.3   Liver Function Tests: Recent Labs    01/28/19 0804 06/05/19 0804 09/04/19 0830  AST 20 18 18   ALT 18 16 16   BILITOT 0.7 0.8 0.7  PROT 6.8 7.3 7.0   No results for input(s): LIPASE, AMYLASE in the last 8760 hours. No results for input(s): AMMONIA in the last 8760 hours. CBC: Recent Labs    01/28/19 0804 09/04/19 0830  WBC 6.9 7.1  NEUTROABS 4,678 4,416  HGB 13.2 13.3  HCT 39.9 39.6  MCV 88.9 87.6  PLT 165 159   Lipid Panel: Recent Labs    01/28/19 0804 06/05/19 0804  CHOL 117 116  HDL 43 40  LDLCALC 54 58  TRIG 115 98  CHOLHDL 2.7 2.9   Lab Results  Component Value Date   HGBA1C 5.6 01/06/2020    Assessment/Plan 1. Type 2 diabetes mellitus with stage 2 chronic kidney disease, without long-term current use of insulin (HCC) -control still good but slightly increased hba1c to 6.7 -cont same regimen  -had urine microalbumin done by housecalls program nurse today which was negative--he's on an ace already anyway - Hemoglobin A1c; Future - BASIC METABOLIC PANEL WITH GFR; Future - Lipid panel; Future  2. Splenic artery aneurysm ([place]) -had discussion with his sister when this  was found and we opted to monitor for any symptoms and avoid recurrent imaging due to stress it causes pt  3. Mixed hyperlipidemia - f/u lab next time;  Lab Results  Component Value Date   LDLCALC 58 06/05/2019  has been at goal with high dose lipitor - Lipid panel; Future  4. Essential hypertension, benign -bp well controlled on current regimen  5. Sensorineural hearing loss (SNHL) of both ears -requires regular flushing of ears for cerumen which helps  6. Prostate nodule without urinary obstruction -stable visits with urology  7. Encounter for hepatitis C screening test for low risk patient - Hepatitis C antibody; Future  Labs/tests ordered:  * No order type specified * Next appt:  05/29/2020   Anthonia Monger L. Davis Ambrosini, D.O. [facility] [address], [place] [address] Cell Phone (Mon-Fri 8am-5pm):  [phone] On Call:  [phone] & follow prompts after 5pm & weekends Office Phone:  [phone] Office Fax:  [phone]

## 2020-01-09 NOTE — Patient Instructions (Signed)
Cerave cream for dry skin on arms.

## 2020-01-09 NOTE — Addendum Note (Signed)
Addended by: Tanna Savoy on: 01/09/2020 03:49 PM   Modules accepted: Orders

## 2020-01-23 DIAGNOSIS — Z7984 Long term (current) use of oral hypoglycemic drugs: Secondary | ICD-10-CM | POA: Diagnosis not present

## 2020-01-23 DIAGNOSIS — H2513 Age-related nuclear cataract, bilateral: Secondary | ICD-10-CM | POA: Diagnosis not present

## 2020-01-23 DIAGNOSIS — E1136 Type 2 diabetes mellitus with diabetic cataract: Secondary | ICD-10-CM | POA: Diagnosis not present

## 2020-01-23 LAB — HM DIABETES EYE EXAM

## 2020-01-25 ENCOUNTER — Other Ambulatory Visit: Payer: Self-pay | Admitting: Internal Medicine

## 2020-01-29 ENCOUNTER — Encounter: Payer: Self-pay | Admitting: Internal Medicine

## 2020-03-02 ENCOUNTER — Other Ambulatory Visit: Payer: Self-pay | Admitting: Internal Medicine

## 2020-03-03 ENCOUNTER — Other Ambulatory Visit: Payer: Self-pay | Admitting: Internal Medicine

## 2020-03-12 ENCOUNTER — Other Ambulatory Visit: Payer: Self-pay | Admitting: *Deleted

## 2020-03-12 DIAGNOSIS — N182 Chronic kidney disease, stage 2 (mild): Secondary | ICD-10-CM

## 2020-03-12 DIAGNOSIS — E1122 Type 2 diabetes mellitus with diabetic chronic kidney disease: Secondary | ICD-10-CM

## 2020-03-12 MED ORDER — LINAGLIPTIN 5 MG PO TABS: 5.0000 mg | ORAL_TABLET | Freq: Every day | ORAL | 0 refills | Status: DC

## 2020-03-12 MED ORDER — GLIPIZIDE ER 5 MG PO TB24: ORAL_TABLET | ORAL | 0 refills | Status: DC

## 2020-03-12 MED ORDER — METFORMIN HCL 1000 MG PO TABS: 1000.0000 mg | ORAL_TABLET | Freq: Every day | ORAL | 0 refills | Status: DC

## 2020-03-12 NOTE — Telephone Encounter (Signed)
[place] Larena [place]. Stated that he forgot to bring enough medication for his diabetes to last until Sunday when they come home. Needs a 3 day supply.  Given address to the [facility] Oppen[place] there at [place].

## 2020-04-03 ENCOUNTER — Other Ambulatory Visit: Payer: Self-pay | Admitting: Internal Medicine

## 2020-04-03 DIAGNOSIS — E1169 Type 2 diabetes mellitus with other specified complication: Secondary | ICD-10-CM

## 2020-04-15 ENCOUNTER — Other Ambulatory Visit: Payer: Self-pay | Admitting: Internal Medicine

## 2020-04-21 ENCOUNTER — Encounter: Payer: Self-pay | Admitting: Family

## 2020-04-21 ENCOUNTER — Other Ambulatory Visit: Payer: Self-pay

## 2020-04-21 ENCOUNTER — Ambulatory Visit (INDEPENDENT_AMBULATORY_CARE_PROVIDER_SITE_OTHER): Payer: Medicare Other | Admitting: Family

## 2020-04-21 VITALS — BP 120/86 | HR 99 | Temp 97.7°F | Resp 16 | Ht 65.0 in | Wt 183.3 lb

## 2020-04-21 DIAGNOSIS — H903 Sensorineural hearing loss, bilateral: Secondary | ICD-10-CM

## 2020-04-21 DIAGNOSIS — H6123 Impacted cerumen, bilateral: Secondary | ICD-10-CM

## 2020-04-21 NOTE — Patient Instructions (Signed)
Earwax Buildup, Adult The ears produce a substance called earwax that helps keep bacteria out of the ear and protects the skin in the ear canal. Occasionally, earwax can build up in the ear and cause discomfort or hearing loss. What increases the risk? This condition is more likely to develop in people who:  Are male.  Are elderly.  Naturally produce more earwax.  Clean their ears often with cotton swabs.  Use earplugs often.  Use in-ear headphones often.  Wear hearing aids.  Have narrow ear canals.  Have earwax that is overly thick or sticky.  Have eczema.  Are dehydrated.  Have excess hair in the ear canal. What are the signs or symptoms? Symptoms of this condition include:  Reduced or muffled hearing.  A feeling of fullness in the ear or feeling that the ear is plugged.  Fluid coming from the ear.  Ear pain.  Ear itch.  Ringing in the ear.  Coughing.  An obvious piece of earwax that can be seen inside the ear canal. How is this diagnosed? This condition may be diagnosed based on:  Your symptoms.  Your medical history.  An ear exam. During the exam, your health care provider will look into your ear with an instrument called an otoscope. You may have tests, including a hearing test. How is this treated? This condition may be treated by:  Using ear drops to soften the earwax.  Having the earwax removed by a health care provider. The health care provider may: ? Flush the ear with water. ? Use an instrument that has a loop on the end (curette). ? Use a suction device.  Surgery to remove the wax buildup. This may be done in severe cases. Follow these instructions at home:   Take over-the-counter and prescription medicines only as told by your health care provider.  Do not put any objects, including cotton swabs, into your ear. You can clean the opening of your ear canal with a washcloth or facial tissue.  Follow instructions from your health care  provider about cleaning your ears. Do not over-clean your ears.  Drink enough fluid to keep your urine clear or pale yellow. This will help to thin the earwax.  Keep all follow-up visits as told by your health care provider. If earwax builds up in your ears often or if you use hearing aids, consider seeing your health care provider for routine, preventive ear cleanings. Ask your health care provider how often you should schedule your cleanings.  If you have hearing aids, clean them according to instructions from the manufacturer and your health care provider. Contact a health care provider if:  You have ear pain.  You develop a fever.  You have blood, pus, or other fluid coming from your ear.  You have hearing loss.  You have ringing in your ears that does not go away.  Your symptoms do not improve with treatment.  You feel like the room is spinning (vertigo). Summary  Earwax can build up in the ear and cause discomfort or hearing loss.  The most common symptoms of this condition include reduced or muffled hearing and a feeling of fullness in the ear or feeling that the ear is plugged.  This condition may be diagnosed based on your symptoms, your medical history, and an ear exam.  This condition may be treated by using ear drops to soften the earwax or by having the earwax removed by a health care provider.  Do not put any   objects, including cotton swabs, into your ear. You can clean the opening of your ear canal with a washcloth or facial tissue. This information is not intended to replace advice given to you by your health care provider. Make sure you discuss any questions you have with your health care provider. Document Revised: 06/30/2017 Document Reviewed: 09/28/2016 Elsevier Patient Education  2020 Elsevier Inc.  

## 2020-04-21 NOTE — Progress Notes (Signed)
Provider: Ivry Pigue FNP-C  Gayland Curry, DO  Patient Care Team: Alferd Apa as PCP - General (Geriatric Medicine)  Extended Emergency Contact Information Primary Emergency Contact: Debeer,Patricia Address: [address]          [place], [place] [address] Johnnette Litter of [place] Phone: [phone] Relation: Sister  Code Status: Full Code  Goals of care: Advanced Directive information Advanced Directives 04/21/2020  Does Patient Have a Medical Advance Directive? No  Type of Advance Directive -  Does patient want to make changes to medical advance directive? No - Patient declined  Copy of [place] in Chart? -  Would patient like information on creating a medical advance directive? -     Chief Complaint  Patient presents with   Acute Visit    Ear Cleaning.    HPI:  Pt is a 79 y.o. male seen today for an acute visit for evaluation of both ear for cerumen.He denies any pain,drainage,fever,chills, or acute issues.He states no worsening hearing loss. He states his ears tends to fill up with wax.  Past Medical History:  Diagnosis Date   Arthritis    Chronic kidney disease    CKD- Stage  II   Colon cancer ([place])    2014   Colon polyps    adenomatous   Diabetes mellitus without complication ([place])    Eczema    Hypercholesteremia    Hypertension    Past Surgical History:  Procedure Laterality Date   COLON SURGERY  2014 & 2015   resection of colon & removal of poylp ; Dr Kathleene Hazel, Dr Sherlene Shams   COLONOSCOPY  09/29/2013   COLONOSCOPY WITH PROPOFOL N/A 03/10/2015   Procedure: COLONOSCOPY WITH PROPOFOL;  Surgeon: Inda Castle, MD;  Location: [facility];  Service: Endoscopy;  Laterality: N/A;   [place] N/A 07/21/2014   Procedure: FLEXIBLE SIGMOIDOSCOPY;  Surgeon: Inda Castle, MD;  Location: [facility];  Service: Endoscopy;  Laterality: N/A;    No Known  Allergies  Outpatient Encounter Medications as of 04/21/2020  Medication Sig   atorvastatin (LIPITOR) 80 MG tablet TAKE ONE TABLET BY MOUTH DAILY   clotrimazole-betamethasone (LOTRISONE) cream Apply 1 application topically 2 (two) times daily.   diclofenac sodium (VOLTAREN) 1 % GEL Apply 4 g topically 4 (four) times daily. To lower back   ferrous sulfate 325 (65 FE) MG tablet Take 325 mg by mouth daily.   finasteride (PROSCAR) 5 MG tablet TAKE ONE TABLET BY MOUTH DAILY   glipiZIDE (GLUCOTROL XL) 5 MG 24 hr tablet TAKE ONE TABLET BY MOUTH EVERY MORNING WITH BREAKFAST   glucose blood (ONETOUCH ULTRA) test strip USE TO CHECK BLOOD SUGARS ONCE DAILY AS DIRECTED FOR DIABETES   Lancets (ONETOUCH DELICA PLUS [id number]) MISC USE UP TO 4 TIMES A DAY AS DIRECTED   linagliptin (TRADJENTA) 5 MG TABS tablet Take 1 tablet (5 mg total) by mouth daily.   lisinopril (ZESTRIL) 5 MG tablet TAKE ONE TABLET BY MOUTH DAILY   loratadine (CLARITIN) 10 MG tablet Take 1 tablet (10 mg total) by mouth daily.   meclizine (ANTIVERT) 12.5 MG tablet Take 1 tablet (12.5 mg total) by mouth 3 (three) times daily as needed for dizziness.   metFORMIN (GLUCOPHAGE) 1000 MG tablet Take 1 tablet (1,000 mg total) by mouth daily with breakfast.   tamsulosin (FLOMAX) 0.4 MG CAPS capsule TAKE ONE CAPSULE BY MOUTH DAILY   zinc sulfate 220 (50 Zn)  MG capsule Take 220 mg by mouth daily.   No facility-administered encounter medications on file as of 04/21/2020.    Review of Systems  Constitutional: Negative for appetite change, chills, fatigue and fever.  HENT: Positive for hearing loss. Negative for ear discharge, ear pain, nosebleeds, postnasal drip, rhinorrhea, sinus pressure, sinus pain, sneezing and sore throat.        Stuffy nose uses nasal spray sometimes with relief   Respiratory: Negative for cough, chest tightness, shortness of breath and wheezing.   Skin: Negative for color change, pallor and rash.   Neurological: Negative for dizziness, speech difficulty, light-headedness and headaches.    Immunization History  Administered Date(s) Administered   Fluad Quad(high Dose 65+) 03/25/2019   Influenza, High Dose Seasonal PF 04/27/2017, 04/30/2018   Influenza,inj,Quad PF,6+ Mos 05/14/2015, 06/03/2016   Influenza-Unspecified 05/26/2011, 08/15/2012, 05/01/2013   Pneumococcal Conjugate-13 08/13/2015   Pneumococcal Polysaccharide-23 03/12/2009   Tdap 02/07/2013   Zoster 01/03/2016   Pertinent  Health Maintenance Due  Topic Date Due   INFLUENZA VACCINE  03/01/2020   HEMOGLOBIN A1C  07/07/2020   FOOT EXAM  10/14/2020   OPHTHALMOLOGY EXAM  01/22/2021   PNA vac Low Risk Adult  Completed   Fall Risk  04/21/2020 01/09/2020 12/23/2019 09/09/2019 08/09/2019  Falls in the past year? 0 0 0 0 0  Number falls in past yr: 0 0 0 0 0  Comment - - - - -  Injury with Fall? 0 0 0 0 0  Comment - - - - -    Vitals:   04/21/20 0822  BP: 120/86  Pulse: 99  Resp: 16  Temp: 97.7 F (36.5 C)  SpO2: 98%  Weight: 183 lb 4.8 oz (83.1 kg)  Height: 5\' 5"  (1.651 m)   Body mass index is 30.5 kg/m. Physical Exam Vitals reviewed.  Constitutional:      General: He is not in acute distress.    Appearance: He is obese. He is not ill-appearing.  HENT:     Head: Normocephalic.     Right Ear: There is impacted cerumen.     Left Ear: There is impacted cerumen.     Ears:     Comments: Bilateral ear lavaged with warm water mixed with peroxide tolerated procedure well.moderate amounts of brown cerumen removed using curette and Alligator forceps on both ears.No pain TM clear on both ears.  Eyes:     General: No scleral icterus.       Right eye: No discharge.        Left eye: No discharge.     Conjunctiva/sclera: Conjunctivae normal.     Pupils: Pupils are equal, round, and reactive to light.  Cardiovascular:     Rate and Rhythm: Normal rate and regular rhythm.     Pulses: Normal pulses.     Heart  sounds: Normal heart sounds. No murmur heard.  No friction rub. No gallop.   Pulmonary:     Effort: Pulmonary effort is normal. No respiratory distress.     Breath sounds: Normal breath sounds. No wheezing, rhonchi or rales.  Chest:     Chest wall: No tenderness.  Abdominal:     General: Bowel sounds are normal. There is no distension.     Palpations: Abdomen is soft. There is no mass.     Tenderness: There is no abdominal tenderness. There is no guarding or rebound.  Skin:    General: Skin is warm.     Coloration: Skin is not pale.  Findings: No bruising, erythema or rash.  Neurological:     Mental Status: He is alert.     Comments: HOH bilateral   Psychiatric:        Mood and Affect: Mood normal.        Speech: Speech normal.        Behavior: Behavior normal.        Thought Content: Thought content normal.    Labs reviewed: Recent Labs    06/05/19 0804 09/04/19 0830 01/06/20 0837  NA 139 138 139  K 4.6 4.4 4.7  CL 104 103 105  CO2 27 27 26   GLUCOSE 98 118* 83  BUN 32* 28* 38*  CREATININE 1.34* 1.34* 1.35*  CALCIUM 9.6 9.2 9.3   Recent Labs    06/05/19 0804 09/04/19 0830  AST 18 18  ALT 16 16  BILITOT 0.8 0.7  PROT 7.3 7.0   Recent Labs    09/04/19 0830  WBC 7.1  NEUTROABS 4,416  HGB 13.3  HCT 39.6  MCV 87.6  PLT 159   No results found for: TSH Lab Results  Component Value Date   HGBA1C 5.6 01/06/2020   Lab Results  Component Value Date   CHOL 116 06/05/2019   HDL 40 06/05/2019   LDLCALC 58 06/05/2019   TRIG 98 06/05/2019   CHOLHDL 2.9 06/05/2019    Significant Diagnostic Results in last 30 days:  No results found.  Assessment/Plan 1. Bilateral impacted cerumen Bilateral ear cerumen impaction lavaged with warm water and hydrogen peroxide tolerated procedure well.moderate amounts of cerumen obtained using curette and Alligator forceps on both ears.No pain TM clear on both ears. Verbalized feeling much better.  - notified to call  provider for any ear pain,fever,chills or drainage.  2. Sensorineural hearing loss (SNHL) of both ears HOH on both ears does not wear hearing aids  Family/ staff Communication: Reviewed plan of care with patient  Labs/tests ordered: None   Next Appointment: Has upcoming appointment with Dr.Reed 05/29/2020  Sandrea Hughs, NP

## 2020-04-25 ENCOUNTER — Other Ambulatory Visit: Payer: Self-pay | Admitting: Family

## 2020-04-25 DIAGNOSIS — E1122 Type 2 diabetes mellitus with diabetic chronic kidney disease: Secondary | ICD-10-CM

## 2020-04-25 DIAGNOSIS — N182 Chronic kidney disease, stage 2 (mild): Secondary | ICD-10-CM

## 2020-04-27 NOTE — Telephone Encounter (Signed)
Patient has upcoming appointment on 05/11/2020. Patient is requesting refill on medication "Metformin". Medication was last filled on 03/12/2020 with Quantity "3" with no refills. I'm not sure if this was temporary medication. Patient is requesting 90 day supply. Medication pend and sent to provider. Please Advise.

## 2020-05-01 ENCOUNTER — Other Ambulatory Visit: Payer: Self-pay | Admitting: Internal Medicine

## 2020-05-01 NOTE — Telephone Encounter (Signed)
rx sent to pharmacy by e-script  

## 2020-05-05 ENCOUNTER — Telehealth: Payer: Self-pay | Admitting: Internal Medicine

## 2020-05-05 DIAGNOSIS — M9904 Segmental and somatic dysfunction of sacral region: Secondary | ICD-10-CM | POA: Diagnosis not present

## 2020-05-05 DIAGNOSIS — S338XXA Sprain of other parts of lumbar spine and pelvis, initial encounter: Secondary | ICD-10-CM | POA: Diagnosis not present

## 2020-05-05 DIAGNOSIS — M9902 Segmental and somatic dysfunction of thoracic region: Secondary | ICD-10-CM | POA: Diagnosis not present

## 2020-05-05 DIAGNOSIS — G8929 Other chronic pain: Secondary | ICD-10-CM

## 2020-05-05 DIAGNOSIS — M545 Low back pain, unspecified: Secondary | ICD-10-CM

## 2020-05-05 DIAGNOSIS — M9903 Segmental and somatic dysfunction of lumbar region: Secondary | ICD-10-CM | POA: Diagnosis not present

## 2020-05-05 DIAGNOSIS — M5134 Other intervertebral disc degeneration, thoracic region: Secondary | ICD-10-CM | POA: Diagnosis not present

## 2020-05-05 NOTE — Telephone Encounter (Signed)
Order placed for low back xray at [facility] can walk in for this tomorrow so it will be done in time to see his chiropractor on Thursday.

## 2020-05-05 NOTE — Telephone Encounter (Signed)
Pt seen Dr Raynelle Chary 05/05/20 & he suggested an xray to be order for lower back area to be done before his appt with Dr Raynelle Chary again 05/07/20 at 815a.  Thanks, Kathyrn Lass

## 2020-05-07 DIAGNOSIS — M5134 Other intervertebral disc degeneration, thoracic region: Secondary | ICD-10-CM | POA: Diagnosis not present

## 2020-05-07 DIAGNOSIS — M9902 Segmental and somatic dysfunction of thoracic region: Secondary | ICD-10-CM | POA: Diagnosis not present

## 2020-05-07 DIAGNOSIS — M9904 Segmental and somatic dysfunction of sacral region: Secondary | ICD-10-CM | POA: Diagnosis not present

## 2020-05-07 DIAGNOSIS — S338XXA Sprain of other parts of lumbar spine and pelvis, initial encounter: Secondary | ICD-10-CM | POA: Diagnosis not present

## 2020-05-07 DIAGNOSIS — M9903 Segmental and somatic dysfunction of lumbar region: Secondary | ICD-10-CM | POA: Diagnosis not present

## 2020-05-11 ENCOUNTER — Ambulatory Visit
Admission: RE | Admit: 2020-05-11 | Discharge: 2020-05-11 | Disposition: A | Discharge status: Home / Self Care | Payer: Medicare Other | Source: Ambulatory Visit | Attending: Internal Medicine | Admitting: Internal Medicine

## 2020-05-11 ENCOUNTER — Encounter: Payer: Medicare Other | Admitting: Nurse Practitioner

## 2020-05-11 ENCOUNTER — Other Ambulatory Visit: Payer: Medicare Other

## 2020-05-11 ENCOUNTER — Encounter: Payer: Self-pay | Admitting: Nurse Practitioner

## 2020-05-11 ENCOUNTER — Other Ambulatory Visit: Payer: Self-pay

## 2020-05-11 DIAGNOSIS — G8929 Other chronic pain: Secondary | ICD-10-CM

## 2020-05-11 DIAGNOSIS — Z1159 Encounter for screening for other viral diseases: Secondary | ICD-10-CM | POA: Diagnosis not present

## 2020-05-11 DIAGNOSIS — E782 Mixed hyperlipidemia: Secondary | ICD-10-CM

## 2020-05-11 DIAGNOSIS — N182 Chronic kidney disease, stage 2 (mild): Secondary | ICD-10-CM | POA: Diagnosis not present

## 2020-05-11 DIAGNOSIS — M47816 Spondylosis without myelopathy or radiculopathy, lumbar region: Secondary | ICD-10-CM | POA: Diagnosis not present

## 2020-05-11 DIAGNOSIS — E1122 Type 2 diabetes mellitus with diabetic chronic kidney disease: Secondary | ICD-10-CM

## 2020-05-12 DIAGNOSIS — M5137 Other intervertebral disc degeneration, lumbosacral region: Secondary | ICD-10-CM | POA: Diagnosis not present

## 2020-05-12 DIAGNOSIS — M9902 Segmental and somatic dysfunction of thoracic region: Secondary | ICD-10-CM | POA: Diagnosis not present

## 2020-05-12 DIAGNOSIS — M9905 Segmental and somatic dysfunction of pelvic region: Secondary | ICD-10-CM | POA: Diagnosis not present

## 2020-05-12 DIAGNOSIS — M9903 Segmental and somatic dysfunction of lumbar region: Secondary | ICD-10-CM | POA: Diagnosis not present

## 2020-05-12 DIAGNOSIS — M47817 Spondylosis without myelopathy or radiculopathy, lumbosacral region: Secondary | ICD-10-CM | POA: Diagnosis not present

## 2020-05-12 LAB — BASIC METABOLIC PANEL WITH GFR
BUN/Creatinine Ratio: 28 (calc) — ABNORMAL HIGH (ref 6–22)
BUN: 42 mg/dL — ABNORMAL HIGH (ref 7–25)
CO2: 27 mmol/L (ref 20–32)
Calcium: 9.5 mg/dL (ref 8.6–10.3)
Chloride: 105 mmol/L (ref 98–110)
Creat: 1.49 mg/dL — ABNORMAL HIGH (ref 0.70–1.18)
GFR, Est African American: 51 mL/min/{1.73_m2} — ABNORMAL LOW (ref >=60)
GFR, Est Non African American: 44 mL/min/{1.73_m2} — ABNORMAL LOW (ref >=60)
Glucose, Bld: 91 mg/dL (ref 65–99)
Potassium: 5 mmol/L (ref 3.5–5.3)
Sodium: 139 mmol/L (ref 135–146)

## 2020-05-12 LAB — LIPID PANEL
Cholesterol: 225 mg/dL — ABNORMAL HIGH (ref <200)
HDL: 46 mg/dL (ref >=40)
LDL Cholesterol (Calc): 150 mg/dL (calc) — ABNORMAL HIGH
Non-HDL Cholesterol (Calc): 179 mg/dL (calc) — ABNORMAL HIGH (ref <130)
Total CHOL/HDL Ratio: 4.9 (calc) (ref <5.0)
Triglycerides: 156 mg/dL — ABNORMAL HIGH (ref <150)

## 2020-05-12 LAB — HEMOGLOBIN A1C
Hgb A1c MFr Bld: 5.9 % of total Hgb — ABNORMAL HIGH (ref <5.7)
Mean Plasma Glucose: 123 (calc)
eAG (mmol/L): 6.8 (calc)

## 2020-05-12 LAB — HEPATITIS C ANTIBODY
Hepatitis C Ab: NONREACTIVE
SIGNAL TO CUT-OFF: 0.08 (ref <1.00)

## 2020-05-12 NOTE — Progress Notes (Signed)
err

## 2020-05-12 NOTE — Progress Notes (Signed)
Not sure what Justin [name]'s been eating, but his bad cholesterol has tripled and his triglycerides are now elevated mildly.   His kidney function has declined a bit and his sugar average has gone up from 5.6 in the normal range to 5.9.

## 2020-05-12 NOTE — Progress Notes (Signed)
Hepatitis C screen was negative.  See previous comments about labs.

## 2020-05-12 NOTE — Progress Notes (Signed)
Please sent to requesting chiropractor.

## 2020-05-14 ENCOUNTER — Ambulatory Visit (INDEPENDENT_AMBULATORY_CARE_PROVIDER_SITE_OTHER): Payer: Medicare Other | Admitting: Internal Medicine

## 2020-05-14 ENCOUNTER — Encounter: Payer: Self-pay | Admitting: Internal Medicine

## 2020-05-14 ENCOUNTER — Other Ambulatory Visit: Payer: Self-pay

## 2020-05-14 VITALS — BP 98/62 | HR 83 | Temp 98.1°F | Resp 20 | Ht 65.0 in | Wt 181.0 lb

## 2020-05-14 DIAGNOSIS — M545 Low back pain, unspecified: Secondary | ICD-10-CM

## 2020-05-14 DIAGNOSIS — E782 Mixed hyperlipidemia: Secondary | ICD-10-CM | POA: Diagnosis not present

## 2020-05-14 DIAGNOSIS — N182 Chronic kidney disease, stage 2 (mild): Secondary | ICD-10-CM

## 2020-05-14 DIAGNOSIS — Z23 Encounter for immunization: Secondary | ICD-10-CM

## 2020-05-14 DIAGNOSIS — E1122 Type 2 diabetes mellitus with diabetic chronic kidney disease: Secondary | ICD-10-CM

## 2020-05-14 DIAGNOSIS — I10 Essential (primary) hypertension: Secondary | ICD-10-CM

## 2020-05-14 DIAGNOSIS — E6609 Other obesity due to excess calories: Secondary | ICD-10-CM

## 2020-05-14 DIAGNOSIS — G8929 Other chronic pain: Secondary | ICD-10-CM

## 2020-05-14 DIAGNOSIS — Z6830 Body mass index (BMI) 30.0-30.9, adult: Secondary | ICD-10-CM

## 2020-05-14 MED ORDER — ROSUVASTATIN CALCIUM 20 MG PO TABS: 20.0000 mg | ORAL_TABLET | Freq: Every day | ORAL | 3 refills | Status: DC

## 2020-05-14 NOTE — Patient Instructions (Addendum)
Stop lipitor Start crestor 20mg  nightly for cholesterol which has gone way up lately. Continue to take all of your other pills just as before.  If you have pain in your back or side, use tylenol for the pain.

## 2020-05-14 NOTE — Progress Notes (Signed)
Location:  [facility] clinic Provider:  Nayely Dingus L. Mariea Clonts, D.O., C.M.D.  Goals of Care:  Advanced Directives 05/14/2020  Does Patient Have a Medical Advance Directive? Yes  Type of Advance Directive -  Does patient want to make changes to medical advance directive? No - Patient declined  Copy of [place] in Chart? -  Would patient like information on creating a medical advance directive? -   Chief Complaint  Patient presents with  . Medical Management of Chronic Issues    4 Month Follow Up    HPI: Patient is a 79 y.o. male seen today for medical management of chronic diseases.    Left side of his waist area hurts him.  It goes down his left side.  We did xrays at [facility] and for some reason brings me a CD now with xrays reportedly from his chiropractor office.  He had some manipulation there and got himself a new mattress for his pain.  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  Extensive discussion was previously held and we'd decided not to pursue further investigations of it.    DMII:  CBG was 97 today he says (this am).   He has quit eating popcorn and chips a long time ago.   CBG last night was 159.   He mostly only checks his fasting cbgs. Sugar average increased from 5.6 to 6.9 and bad cholesterol tripled.  He gets very upset when I counsel him about what not to eat.  Almost agitated.   Discussed high cholesterol--unable to determine what he's done to make it go up b/c he gets angry about it like I'm accusing him.  His sister had reported that he was eating a lot of hot dogs to save money after he had some car expenses.  She was going to counsel him also about his eating.  Past Medical History:  Diagnosis Date  . Arthritis   . Chronic kidney disease    CKD- Stage  II  . Colon cancer ([place])    2014  . Colon polyps    adenomatous  . Diabetes  mellitus without complication ([place])   . Eczema   . Hypercholesteremia   . Hypertension     Past Surgical History:  Procedure Laterality Date  . COLON SURGERY  2014 & 2015   resection of colon & removal of poylp ; Dr Kathleene Hazel, Dr Sherlene Shams  . COLONOSCOPY  09/29/2013  . COLONOSCOPY WITH PROPOFOL N/A 03/10/2015   Procedure: COLONOSCOPY WITH PROPOFOL;  Surgeon: Inda Castle, MD;  [facility];  Service: Endoscopy;  Laterality: N/A;  . EXTERNAL EAR SURGERY  1954  . FLEXIBLE SIGMOIDOSCOPY N/A 07/21/2014   Procedure: FLEXIBLE SIGMOIDOSCOPY;  Surgeon: Inda Castle, MD;  [facility]: [facility];  Service: Endoscopy;  Laterality: N/A;    No Known Allergies  Outpatient Encounter Medications as of 05/14/2020  Medication Sig  . atorvastatin (LIPITOR) 80 MG tablet TAKE ONE TABLET BY MOUTH DAILY  . clotrimazole-betamethasone (LOTRISONE) cream Apply 1 application topically 2 (two) times daily.  . diclofenac sodium (VOLTAREN) 1 % GEL Apply 4 g topically 4 (four) times daily. To lower back  . ferrous sulfate 325 (65 FE) MG tablet Take 325 mg by mouth daily.  . finasteride (PROSCAR) 5 MG tablet TAKE ONE TABLET BY MOUTH DAILY  . glipiZIDE (GLUCOTROL XL) 5 MG 24 hr  tablet TAKE ONE TABLET BY MOUTH EVERY MORNING WITH BREAKFAST  . glucose blood (ONETOUCH ULTRA) test strip USE TO CHECK BLOOD SUGARS ONCE DAILY AS DIRECTED FOR DIABETES  . Lancets (ONETOUCH DELICA PLUS [id number]) MISC USE UP TO 4 TIMES A DAY AS DIRECTED  . linagliptin (TRADJENTA) 5 MG TABS tablet Take 1 tablet (5 mg total) by mouth daily.  Marland Kitchen lisinopril (ZESTRIL) 5 MG tablet TAKE ONE TABLET BY MOUTH DAILY  . loratadine (CLARITIN) 10 MG tablet Take 1 tablet (10 mg total) by mouth daily.  . metFORMIN (GLUCOPHAGE) 1000 MG tablet TAKE ONE TABLET BY MOUTH DAILY WITH BREAKFAST  . tamsulosin (FLOMAX) 0.4 MG CAPS capsule TAKE ONE CAPSULE BY MOUTH DAILY  . zinc sulfate 220 (50 Zn) MG capsule Take 220 mg by mouth daily.   . meclizine  (ANTIVERT) 12.5 MG tablet Take 1 tablet (12.5 mg total) by mouth 3 (three) times daily as needed for dizziness. (Patient not taking: Reported on 05/14/2020)   No facility-administered encounter medications on file as of 05/14/2020.    Review of Systems:  Review of Systems  Constitutional: Negative for chills, fever and malaise/fatigue.  HENT: Positive for hearing loss. Negative for congestion and sore throat.   Eyes: Negative for blurred vision.  Respiratory: Negative for shortness of breath.   Cardiovascular: Negative for chest pain, palpitations and leg swelling.  Gastrointestinal: Negative for abdominal pain, blood in stool, constipation and melena.  Genitourinary: Negative for dysuria.  Musculoskeletal: Positive for back pain. Negative for falls and joint pain.  Skin: Negative for itching and rash.  Neurological: Negative for dizziness and loss of consciousness.  Psychiatric/Behavioral:       Agitated easily today, also c/o me not being in the office every day--saw NP last time    Health Maintenance  Topic Date Due  . COVID-19 Vaccine (1) Never done  . INFLUENZA VACCINE  03/01/2020  . FOOT EXAM  10/14/2020  . HEMOGLOBIN A1C  11/09/2020  . OPHTHALMOLOGY EXAM  01/22/2021  . TETANUS/TDAP  02/08/2023  . Hepatitis C Screening  Completed  . PNA vac Low Risk Adult  Completed    Physical Exam: Vitals:   05/14/20 1422  BP: 98/62  Pulse: 83  Resp: 20  Temp: 98.1 F (36.7 C)  TempSrc: Temporal  SpO2: 97%  Weight: 181 lb (82.1 kg)  Height: 5\' 5"  (1.651 m)   Body mass index is 30.12 kg/m. Physical Exam Vitals reviewed.  Constitutional:      Appearance: Normal appearance. He is obese.  HENT:     Head: Normocephalic and atraumatic.     Right Ear: Tympanic membrane, ear canal and external ear normal.     Left Ear: Tympanic membrane, ear canal and external ear normal.     Ears:     Comments: HOH    Nose: Nose normal.     Mouth/Throat:     Pharynx: Oropharynx is clear.    Eyes:     Conjunctiva/sclera: Conjunctivae normal.     Pupils: Pupils are equal, round, and reactive to light.     Comments: Wears his dark glasses  Cardiovascular:     Rate and Rhythm: Normal rate and regular rhythm.     Pulses: Normal pulses.     Heart sounds: Normal heart sounds.  Pulmonary:     Effort: Pulmonary effort is normal.     Breath sounds: Normal breath sounds. No wheezing, rhonchi or rales.  Abdominal:     General: Bowel sounds are normal. There is  no distension.     Palpations: Abdomen is soft.     Tenderness: There is no abdominal tenderness. There is no guarding or rebound.  Musculoskeletal:        General: No tenderness. Normal range of motion.     Cervical back: Neck supple.     Right lower leg: No edema.     Left lower leg: No edema.  Lymphadenopathy:     Cervical: No cervical adenopathy.  Skin:    Capillary Refill: Capillary refill takes less than 2 seconds.  Neurological:     General: No focal deficit present.     Mental Status: He is alert and oriented to person, place, and time.     Motor: No weakness.     Gait: Gait normal.  Psychiatric:     Comments: Irritable today     Labs reviewed: Basic Metabolic Panel: Recent Labs    09/04/19 0830 01/06/20 0837 05/11/20 0806  NA 138 139 139  K 4.4 4.7 5.0  CL 103 105 105  CO2 27 26 27   GLUCOSE 118* 83 91  BUN 28* 38* 42*  CREATININE 1.34* 1.35* 1.49*  CALCIUM 9.2 9.3 9.5   Liver Function Tests: Recent Labs    06/05/19 0804 09/04/19 0830  AST 18 18  ALT 16 16  BILITOT 0.8 0.7  PROT 7.3 7.0   No results for input(s): LIPASE, AMYLASE in the last 8760 hours. No results for input(s): AMMONIA in the last 8760 hours. CBC: Recent Labs    09/04/19 0830  WBC 7.1  NEUTROABS 4,416  HGB 13.3  HCT 39.6  MCV 87.6  PLT 159   Lipid Panel: Recent Labs    06/05/19 0804 05/11/20 0806  CHOL 116 225*  HDL 40 46  LDLCALC 58 150*  TRIG 98 156*  CHOLHDL 2.9 4.9   Lab Results  Component Value  Date   HGBA1C 5.9 (H) 05/11/2020    Procedures since last visit: DG Lumbar Spine Complete  Result Date: 05/12/2020 CLINICAL DATA:  Right back, hip and buttock pain for 1 week. EXAM: LUMBAR SPINE - COMPLETE 4+ VIEW COMPARISON:  CT angiogram the abdomen and pelvis 02/27/2019. Plain films lumbar spine 01/03/2019. FINDINGS: No fracture or listhesis. Mild loss of disc space height is most notable at L2-3 and L4-5. Paraspinous structures demonstrate aortic atherosclerosis. Rounded calcification in the left abdomen is consistent with a splenic artery aneurysm seen on prior CT. IMPRESSION: Mild appearing lumbar spondylosis. No change compared to the prior exam. 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 Aortic Atherosclerosis (ICD10-I70.0). Electronically Signed   By: Inge Rise M.D.   On: 05/12/2020 15:47    Assessment/Plan 1. Mixed hyperlipidemia -opted to change his lipitor to crestor due to uncontrolled cholesterol that had tripled recently - rosuvastatin (CRESTOR) 20 MG tablet; Take 1 tablet (20 mg total) by mouth daily.  Dispense: 90 tablet; Refill: 3 - Lipid panel; Future  2. Chronic midline low back pain without sciatica -cont same regimen, weight gain not helpful, sees chiropractor  3. Type 2 diabetes mellitus with stage 2 chronic kidney disease, without long-term current use of insulin (HCC) - hba1c trended up due to choosing to eat unhealthy items to save money -not high enough to warrant a med change - Hemoglobin A1c; Future - BASIC METABOLIC PANEL WITH GFR; Future - CBC with Differential/Platelet; Future  4. Essential hypertension, benign -bp low today, but no reports of dizziness, monitor (odd with his recent food choices--would expect very high) -  CBC with Differential/Platelet; Future  5. Need for influenza vaccination - Flu Vaccine QUAD High Dose(Fluad) given  6. Body mass index (BMI) of 30.0-30.9 in adult -remains elevated, though he's down 3 lbs from  June, needs to increase activity and eat the healthier options his family makes  7. Class 1 obesity due to excess calories with serious comorbidity and body mass index (BMI) of 30.0 to 30.9 in adult -encouraged healthier eating habits   Labs/tests ordered:   Lab Orders     Lipid panel     Hemoglobin A1c     BASIC METABOLIC PANEL WITH GFR     CBC with Differential/Platelet  Next appt:  4 mos with fasting labs before--appears he did not make appt when he left today; AWV is 10/29 with NP  Adelai Achey L. Benoit Meech, D.O. [facility] [address], [place] [address] Cell Phone (Mon-Fri 8am-5pm):  [phone] On Call:  [phone] & follow prompts after 5pm & weekends Office Phone:  [phone] Office Fax:  [phone]

## 2020-05-15 DIAGNOSIS — M5137 Other intervertebral disc degeneration, lumbosacral region: Secondary | ICD-10-CM | POA: Diagnosis not present

## 2020-05-15 DIAGNOSIS — M9903 Segmental and somatic dysfunction of lumbar region: Secondary | ICD-10-CM | POA: Diagnosis not present

## 2020-05-15 DIAGNOSIS — M47817 Spondylosis without myelopathy or radiculopathy, lumbosacral region: Secondary | ICD-10-CM | POA: Diagnosis not present

## 2020-05-15 DIAGNOSIS — M9902 Segmental and somatic dysfunction of thoracic region: Secondary | ICD-10-CM | POA: Diagnosis not present

## 2020-05-15 DIAGNOSIS — M9905 Segmental and somatic dysfunction of pelvic region: Secondary | ICD-10-CM | POA: Diagnosis not present

## 2020-05-19 DIAGNOSIS — M9905 Segmental and somatic dysfunction of pelvic region: Secondary | ICD-10-CM | POA: Diagnosis not present

## 2020-05-19 DIAGNOSIS — M47817 Spondylosis without myelopathy or radiculopathy, lumbosacral region: Secondary | ICD-10-CM | POA: Diagnosis not present

## 2020-05-19 DIAGNOSIS — M9902 Segmental and somatic dysfunction of thoracic region: Secondary | ICD-10-CM | POA: Diagnosis not present

## 2020-05-19 DIAGNOSIS — M5137 Other intervertebral disc degeneration, lumbosacral region: Secondary | ICD-10-CM | POA: Diagnosis not present

## 2020-05-19 DIAGNOSIS — M9903 Segmental and somatic dysfunction of lumbar region: Secondary | ICD-10-CM | POA: Diagnosis not present

## 2020-05-29 ENCOUNTER — Encounter: Payer: Self-pay | Admitting: Family

## 2020-05-29 ENCOUNTER — Other Ambulatory Visit: Payer: Self-pay

## 2020-05-29 ENCOUNTER — Ambulatory Visit (INDEPENDENT_AMBULATORY_CARE_PROVIDER_SITE_OTHER): Payer: Medicare Other | Admitting: Family

## 2020-05-29 VITALS — BP 90/60 | HR 66 | Temp 97.1°F | Resp 16 | Ht 65.0 in

## 2020-05-29 DIAGNOSIS — Z0000 Encounter for general adult medical examination without abnormal findings: Secondary | ICD-10-CM

## 2020-05-29 NOTE — Patient Instructions (Signed)
Justin [name] , Thank you for taking time to come for your Medicare Wellness Visit. I appreciate your ongoing commitment to your health goals. Please review the following plan we discussed and let me know if I can assist you in the future.   Screening recommendations/referrals: Colonoscopy: N/A  Recommended yearly ophthalmology/optometry visit for glaucoma screening and checkup Recommended yearly dental visit for hygiene and checkup  Vaccinations: Influenza vaccine: Up to date  Pneumococcal vaccine : Up to date  Tdap vaccine : Up to date  Shingles vaccine: Please get shingles vaccine at your pharmacy   Advanced directives: No   Conditions/risks identified: Advance Age male > 45 yrs,male Gender,Type 2 DM,Dyslipidemia,Hypertension,BMI > 30   Next appointment: 1 year   Preventive Care 80 Years and Older, Male Preventive care refers to lifestyle choices and visits with your health care provider that can promote health and wellness. What does preventive care include?  A yearly physical exam. This is also called an annual well check.  Dental exams once or twice a year.  Routine eye exams. Ask your health care provider how often you should have your eyes checked.  Personal lifestyle choices, including:  Daily care of your teeth and gums.  Regular physical activity.  Eating a healthy diet.  Avoiding tobacco and drug use.  Limiting alcohol use.  Practicing safe sex.  Taking low doses of aspirin every day.  Taking vitamin and mineral supplements as recommended by your health care provider. What happens during an annual well check? The services and screenings done by your health care provider during your annual well check will depend on your age, overall health, lifestyle risk factors, and family history of disease. Counseling  Your health care provider may ask you questions about your:  Alcohol use.  Tobacco use.  Drug use.  Emotional well-being.  Home and relationship  well-being.  Sexual activity.  Eating habits.  History of falls.  Memory and ability to understand (cognition).  Work and work Statistician. Screening  You may have the following tests or measurements:  Height, weight, and BMI.  Blood pressure.  Lipid and cholesterol levels. These may be checked every 5 years, or more frequently if you are over 34 years old.  Skin check.  Lung cancer screening. You may have this screening every year starting at age 62 if you have a 30-pack-year history of smoking and currently smoke or have quit within the past 15 years.  Fecal occult blood test (FOBT) of the stool. You may have this test every year starting at age 62.  Flexible sigmoidoscopy or colonoscopy. You may have a sigmoidoscopy every 5 years or a colonoscopy every 10 years starting at age 58.  Prostate cancer screening. Recommendations will vary depending on your family history and other risks.  Hepatitis C blood test.  Hepatitis B blood test.  Sexually transmitted disease (STD) testing.  Diabetes screening. This is done by checking your blood sugar (glucose) after you have not eaten for a while (fasting). You may have this done every 1-3 years.  Abdominal aortic aneurysm (AAA) screening. You may need this if you are a current or former smoker.  Osteoporosis. You may be screened starting at age 49 if you are at high risk. Talk with your health care provider about your test results, treatment options, and if necessary, the need for more tests. Vaccines  Your health care provider may recommend certain vaccines, such as:  Influenza vaccine. This is recommended every year.  Tetanus, diphtheria, and acellular  pertussis (Tdap, Td) vaccine. You may need a Td booster every 10 years.  Zoster vaccine. You may need this after age 45.  Pneumococcal 13-valent conjugate (PCV13) vaccine. One dose is recommended after age 23.  Pneumococcal polysaccharide (PPSV23) vaccine. One dose is  recommended after age 37. Talk to your health care provider about which screenings and vaccines you need and how often you need them. This information is not intended to replace advice given to you by your health care provider. Make sure you discuss any questions you have with your health care provider. Document Released: 08/14/2015 Document Revised: 04/06/2016 Document Reviewed: 05/19/2015 Elsevier Interactive Patient Education  2017 [place] Prevention in the Home Falls can cause injuries. They can happen to people of all ages. There are many things you can do to make your home safe and to help prevent falls. What can I do on the outside of my home?  Regularly fix the edges of walkways and driveways and fix any cracks.  Remove anything that might make you trip as you walk through a door, such as a raised step or threshold.  Trim any bushes or trees on the path to your home.  Use bright outdoor lighting.  Clear any walking paths of anything that might make someone trip, such as rocks or tools.  Regularly check to see if handrails are loose or broken. Make sure that both sides of any steps have handrails.  Any raised decks and porches should have guardrails on the edges.  Have any leaves, snow, or ice cleared regularly.  Use sand or salt on walking paths during winter.  Clean up any spills in your garage right away. This includes oil or grease spills. What can I do in the bathroom?  Use night lights.  Install grab bars by the toilet and in the tub and shower. Do not use towel bars as grab bars.  Use non-skid mats or decals in the tub or shower.  If you need to sit down in the shower, use a plastic, non-slip stool.  Keep the floor dry. Clean up any water that spills on the floor as soon as it happens.  Remove soap buildup in the tub or shower regularly.  Attach bath mats securely with double-sided non-slip rug tape.  Do not have throw rugs and other things on  the floor that can make you trip. What can I do in the bedroom?  Use night lights.  Make sure that you have a light by your bed that is easy to reach.  Do not use any sheets or blankets that are too big for your bed. They should not hang down onto the floor.  Have a firm chair that has side arms. You can use this for support while you get dressed.  Do not have throw rugs and other things on the floor that can make you trip. What can I do in the kitchen?  Clean up any spills right away.  Avoid walking on wet floors.  Keep items that you use a lot in easy-to-reach places.  If you need to reach something above you, use a strong step stool that has a grab bar.  Keep electrical cords out of the way.  Do not use floor polish or wax that makes floors slippery. If you must use wax, use non-skid floor wax.  Do not have throw rugs and other things on the floor that can make you trip. What can I do with my stairs?  Do not leave any items on the stairs.  Make sure that there are handrails on both sides of the stairs and use them. Fix handrails that are broken or loose. Make sure that handrails are as long as the stairways.  Check any carpeting to make sure that it is firmly attached to the stairs. Fix any carpet that is loose or worn.  Avoid having throw rugs at the top or bottom of the stairs. If you do have throw rugs, attach them to the floor with carpet tape.  Make sure that you have a light switch at the top of the stairs and the bottom of the stairs. If you do not have them, ask someone to add them for you. What else can I do to help prevent falls?  Wear shoes that:  Do not have high heels.  Have rubber bottoms.  Are comfortable and fit you well.  Are closed at the toe. Do not wear sandals.  If you use a stepladder:  Make sure that it is fully opened. Do not climb a closed stepladder.  Make sure that both sides of the stepladder are locked into place.  Ask someone to  hold it for you, if possible.  Clearly mark and make sure that you can see:  Any grab bars or handrails.  First and last steps.  Where the edge of each step is.  Use tools that help you move around (mobility aids) if they are needed. These include:  Canes.  Walkers.  Scooters.  Crutches.  Turn on the lights when you go into a dark area. Replace any light bulbs as soon as they burn out.  Set up your furniture so you have a clear path. Avoid moving your furniture around.  If any of your floors are uneven, fix them.  If there are any pets around you, be aware of where they are.  Review your medicines with your doctor. Some medicines can make you feel dizzy. This can increase your chance of falling. Ask your doctor what other things that you can do to help prevent falls. This information is not intended to replace advice given to you by your health care provider. Make sure you discuss any questions you have with your health care provider. Document Released: 05/14/2009 Document Revised: 12/24/2015 Document Reviewed: 08/22/2014 Elsevier Interactive Patient Education  2017 [employer].

## 2020-05-29 NOTE — Progress Notes (Signed)
Subjective:   Justin [name] is a 79 y.o. male who presents for Medicare Annual/Subsequent preventive examination.  Review of Systems     Cardiac Risk Factors include: advanced age (>27men, >83 women);male gender;diabetes mellitus;dyslipidemia;hypertension;obesity (BMI >30kg/m2)     Objective:    Today's Vitals   05/29/20 0951  BP: 90/60  Pulse: 66  Resp: 16  Temp: (!) 97.1 F (36.2 C)  SpO2: 97%  Height: 5\' 5"  (1.651 m)   Body mass index is 30.12 kg/m.  Advanced Directives 05/29/2020 05/14/2020 04/21/2020 01/09/2020 12/23/2019 09/09/2019 08/09/2019  Does Patient Have a Medical Advance Directive? No Yes No Yes No Yes Yes  Type of Advance Directive - - - Information systems manager of [place];Living will [place];Living will  Does patient want to make changes to medical advance directive? No - Patient declined No - Patient declined No - Patient declined No - Patient declined - No - Patient declined No - Patient declined  Copy of [place] in Chart? - - - - - Yes - validated most recent copy scanned in chart (See row information) Yes - validated most recent copy scanned in chart (See row information)  Would patient like information on creating a medical advance directive? - - - No - Patient declined No - Patient declined - -    Current Medications (verified) Outpatient Encounter Medications as of 05/29/2020  Medication Sig   clotrimazole-betamethasone (LOTRISONE) cream Apply 1 application topically 2 (two) times daily.   diclofenac sodium (VOLTAREN) 1 % GEL Apply 4 g topically 4 (four) times daily. To lower back   ferrous sulfate 325 (65 FE) MG tablet Take 325 mg by mouth daily.   finasteride (PROSCAR) 5 MG tablet TAKE ONE TABLET BY MOUTH DAILY   glipiZIDE (GLUCOTROL XL) 5 MG 24 hr tablet TAKE ONE TABLET BY MOUTH EVERY MORNING WITH BREAKFAST   glucose blood (ONETOUCH ULTRA) test strip USE TO CHECK BLOOD SUGARS ONCE  DAILY AS DIRECTED FOR DIABETES   Lancets (ONETOUCH DELICA PLUS DGUYQI34V) MISC USE UP TO 4 TIMES A DAY AS DIRECTED   linagliptin (TRADJENTA) 5 MG TABS tablet Take 1 tablet (5 mg total) by mouth daily.   lisinopril (ZESTRIL) 5 MG tablet TAKE ONE TABLET BY MOUTH DAILY   loratadine (CLARITIN) 10 MG tablet Take 1 tablet (10 mg total) by mouth daily.   metFORMIN (GLUCOPHAGE) 1000 MG tablet TAKE ONE TABLET BY MOUTH DAILY WITH BREAKFAST   rosuvastatin (CRESTOR) 20 MG tablet Take 1 tablet (20 mg total) by mouth daily.   tamsulosin (FLOMAX) 0.4 MG CAPS capsule TAKE ONE CAPSULE BY MOUTH DAILY   zinc sulfate 220 (50 Zn) MG capsule Take 220 mg by mouth daily.    No facility-administered encounter medications on file as of 05/29/2020.    Allergies (verified) Patient has no known allergies.   History: Past Medical History:  Diagnosis Date   Arthritis    Chronic kidney disease    CKD- Stage  II   Colon cancer ([place])    2014   Colon polyps    adenomatous   Diabetes mellitus without complication ([place])    Eczema    Hypercholesteremia    Hypertension    Past Surgical History:  Procedure Laterality Date   COLON SURGERY  2014 & 2015   resection of colon & removal of poylp ; Dr Kathleene Hazel, Dr Sherlene Shams   COLONOSCOPY  09/29/2013   COLONOSCOPY WITH PROPOFOL N/A 03/10/2015   Procedure:  COLONOSCOPY WITH PROPOFOL;  Surgeon: Inda Castle, MD;  Location: [facility];  Service: Endoscopy;  Laterality: N/A;   [place] N/A 07/21/2014   Procedure: FLEXIBLE SIGMOIDOSCOPY;  Surgeon: Inda Castle, MD;  Location: [facility];  Service: Endoscopy;  Laterality: N/A;   Family History  Problem Relation Age of Onset   Varicose Veins Mother    Stroke Mother    Glaucoma Mother    Kidney disease Mother    Hypertension Father    Heart attack Father    Cancer Brother        testicular cancer   Hyperlipidemia Sister    Meniere's disease  Sister    Social History   Socioeconomic History   Marital status: Single    Spouse name: Not on file   Number of children: Not on file   Years of education: Not on file   Highest education level: Not on file  Occupational History   Not on file  Tobacco Use   Smoking status: Never Smoker   Smokeless tobacco: Never Used  Vaping Use   Vaping Use: Never used  Substance and Sexual Activity   Alcohol use: Yes    Alcohol/week: 7.0 standard drinks    Types: 7 Cans of beer per week    Comment: daily   Drug use: No   Sexual activity: Not on file  Other Topics Concern   Not on file  Social History Narrative   Single, lives in 2 stories home with 3 people and 1 dog.     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   No DNR, has Living Will & POA (sister)   Social Determinants of Health   Financial Resource Strain:    Difficulty of Paying Living Expenses: Not on file  Food Insecurity:    Worried About Charity fundraiser in the Last Year: Not on file   [employer] of Food in the Last Year: Not on file  Transportation Needs:    Lack of Transportation (Medical): Not on file   Lack of Transportation (Non-Medical): Not on file  Physical Activity:    Days of Exercise per Week: Not on file   Minutes of Exercise per Session: Not on file  Stress:    Feeling of Stress : Not on file  Social Connections:    Frequency of Communication with Friends and Family: Not on file   Frequency of Social Gatherings with Friends and Family: Not on file   Attends Religious Services: Not on file   Active Member of Clubs or Organizations: Not on file   Attends Archivist Meetings: Not on file   Marital Status: Not on file    Tobacco Counseling Counseling given: Not Answered   Clinical Intake:  Pre-visit preparation completed: No  Pain : No/denies pain     BMI - recorded: 30.12 Nutritional Status: BMI > 30  Obese Nutritional Risks:  None Diabetes: Yes (150) CBG done?: No Did pt. bring in CBG monitor from home?: No  How often do you need to have someone help you when you read instructions, pamphlets, or other written materials from your doctor or pharmacy?: 1 - Never What is the last grade level you completed in school?: High school  Diabetic?yes   Interpreter Needed?: No  Information entered by :: Jodene Polyak FNP-C   Activities of Daily Living In your present state of health, do you have any  difficulty performing the following activities: 05/29/2020  Hearing? N  Vision? N  Comment weares bifocal  Difficulty concentrating or making decisions? Y  Comment Remembering  Walking or climbing stairs? N  Dressing or bathing? N  Doing errands, shopping? N  Preparing Food and eating ? N  Using the Toilet? N  In the past six months, have you accidently leaked urine? N  Do you have problems with loss of bowel control? N  Managing your Medications? N  Managing your Finances? N  Housekeeping or managing your Housekeeping? N  Some recent data might be hidden    Patient Care Team: Gayland Curry, DO as PCP - General (Geriatric Medicine)  Indicate any recent Medical Services you may have received from other than [facility] providers in the past year (date may be approximate).     Assessment:   This is a routine wellness examination for Aragorn.  Hearing/Vision screen  Hearing Screening   125Hz  250Hz  500Hz  1000Hz  2000Hz  3000Hz  4000Hz  6000Hz  8000Hz   Right ear:           Left ear:           Comments: No Hearing Concerns.   Vision Screening Comments: No Vision Concerns. Last Eye Exam was April 2021. Patient wears prescription glasses.   Dietary issues and exercise activities discussed: Current Exercise Habits: Home exercise routine, Type of exercise: Other - see comments (Rodio machine), Time (Minutes): 15, Frequency (Times/Week): 7, Weekly Exercise (Minutes/Week): 105, Intensity: Mild, Exercise limited by: None  identified  Goals     <enter goal here>     Starting 09/19/16, I will maintain my current lifestyle.       Depression Screen PHQ 2/9 Scores 05/29/2020 05/14/2020 01/09/2020 05/28/2019 02/28/2019 02/04/2019 09/27/2018  PHQ - 2 Score 0 0 0 0 0 0 0    Fall Risk Fall Risk  05/29/2020 05/14/2020 05/11/2020 04/21/2020 01/09/2020  Falls in the past year? 0 0 0 0 0  Number falls in past yr: 0 0 0 0 0  Comment - - - - -  Injury with Fall? 0 0 - 0 0  Comment - - - - -    Any stairs in or around the home? Yes  If so, are there any without handrails? Yes  Home free of loose throw rugs in walkways, pet beds, electrical cords, etc? No  Adequate lighting in your home to reduce risk of falls? Yes   ASSISTIVE DEVICES UTILIZED TO PREVENT FALLS:  Life alert? No  Use of a cane, walker or w/c? Yes  Grab bars in the bathroom? Yes  Shower chair or bench in shower? No  Elevated toilet seat or a handicapped toilet? No   TIMED UP AND GO:  Was the test performed? Yes .  Length of time to ambulate 10 feet: 25 sec.   Gait unsteady without use of assistive device, provider informed and interventions were implemented  Cognitive Function: MMSE - Mini Mental State Exam 05/29/2020 05/28/2019 05/22/2018 09/19/2016 08/13/2015  Orientation to time 5 5 5 5 5   Orientation to time comments 2021, Fall, 05/29/2020, Friday, October. - - - -  Orientation to Place 4 5 4 5 4   Orientation to Place-comments State: [place], [place]: [place]: [place]. - - - -  Registration 3 3 3 3 3   Attention/ Calculation 4 0 5 1 5   Attention/Calculation-comments DROW - - - -  Recall 1 1 2 3 3   Recall-comments Only remembered "[employer]" - - - -  Language-  name 2 objects 2 2 2 2 2   Language- repeat 1 1 1 1 1   Language- follow 3 step command 3 2 3 3 3   Language- read & follow direction 0 1 1 1 1   Language-read & follow direction-comments Only closed Eyes and didn't read it. - - - -  Write a sentence 1 1 1 1 1   Copy  design 1 1 0 0 0  Total score 25 22 27 25 28         Immunizations Immunization History  Administered Date(s) Administered   Fluad Quad(high Dose 65+) 03/25/2019, 05/14/2020   Influenza, High Dose Seasonal PF 04/27/2017, 04/30/2018   Influenza,inj,Quad PF,6+ Mos 05/14/2015, 06/03/2016   Influenza-Unspecified 05/26/2011, 08/15/2012, 05/01/2013   Pneumococcal Conjugate-13 08/13/2015   Pneumococcal Polysaccharide-23 03/12/2009   Tdap 02/07/2013   Zoster 01/03/2016    TDAP status: Up to date Flu Vaccine status: Up to date Pneumococcal vaccine status: Up to date Covid-19 vaccine status: Declined, Education has been provided regarding the importance of this vaccine but patient still declined. Advised may receive this vaccine at local pharmacy or Health Dept.or vaccine clinic. Aware to provide a copy of the vaccination record if obtained from local pharmacy or Health Dept. Verbalized acceptance and understanding.  Qualifies for Shingles Vaccine? Yes   Zostavax completed No   Shingrix Completed?: No.    Education has been provided regarding the importance of this vaccine. Patient has been advised to call insurance company to determine out of pocket expense if they have not yet received this vaccine. Advised may also receive vaccine at local pharmacy or Health Dept. Verbalized acceptance and understanding.  Screening Tests Health Maintenance  Topic Date Due   COVID-19 Vaccine (1) Never done   FOOT EXAM  10/14/2020   HEMOGLOBIN A1C  11/09/2020   OPHTHALMOLOGY EXAM  01/22/2021   TETANUS/TDAP  02/08/2023   INFLUENZA VACCINE  Completed   Hepatitis C Screening  Completed   PNA vac Low Risk Adult  Completed    Health Maintenance  Health Maintenance Due  Topic Date Due   COVID-19 Vaccine (1) Never done    Colorectal cancer screening: No longer required.   Lung Cancer Screening: (Low Dose CT Chest recommended if Age 51-80 years, 30 pack-year currently smoking OR have  quit w/in 15years.) does not qualify.   Lung Cancer Screening Referral: No   Additional Screening:  Hepatitis C Screening: does qualify; Completed Yes   Vision Screening: Recommended annual ophthalmology exams for early detection of glaucoma and other disorders of the eye. Is the patient up to date with their annual eye exam?  Yes  Who is the provider or what is the name of the office in which the patient attends annual eye exams? Dr.Shapiro  If pt is not established with a provider, would they like to be referred to a provider to establish care? No .   Dental Screening: Recommended annual dental exams for proper oral hygiene  Community Resource Referral / Chronic Care Management: CRR required this visit?  No   CCM required this visit?  No      Plan:     I have personally reviewed and noted the following in the patients chart:    Medical and social history  Use of alcohol, tobacco or illicit drugs   Current medications and supplements  Functional ability and status  Nutritional status  Physical activity  Advanced directives  List of other physicians  Hospitalizations, surgeries, and ER visits in previous 12 months  Vitals  Screenings to include cognitive, depression, and falls  Referrals and appointments  In addition, I have reviewed and discussed with patient certain preventive protocols, quality metrics, and best practice recommendations. A written personalized care plan for preventive services as well as general preventive health recommendations were provided to patient.     Nelda Bucks Amalea Ottey, NP   05/29/2020   Nurse Notes: Advised to get COVID-19 vaccine but states Virus might be over by January,2022

## 2020-06-01 ENCOUNTER — Telehealth: Payer: Self-pay

## 2020-06-01 DIAGNOSIS — M9905 Segmental and somatic dysfunction of pelvic region: Secondary | ICD-10-CM | POA: Diagnosis not present

## 2020-06-01 DIAGNOSIS — M9902 Segmental and somatic dysfunction of thoracic region: Secondary | ICD-10-CM | POA: Diagnosis not present

## 2020-06-01 DIAGNOSIS — M9903 Segmental and somatic dysfunction of lumbar region: Secondary | ICD-10-CM | POA: Diagnosis not present

## 2020-06-01 DIAGNOSIS — M5137 Other intervertebral disc degeneration, lumbosacral region: Secondary | ICD-10-CM | POA: Diagnosis not present

## 2020-06-01 DIAGNOSIS — M47817 Spondylosis without myelopathy or radiculopathy, lumbosacral region: Secondary | ICD-10-CM | POA: Diagnosis not present

## 2020-06-01 NOTE — Telephone Encounter (Signed)
For all future appointments please call Mardene Celeste De-Beer "[name] instead of Mr. Tackitt per Dr. Mariea Clonts.

## 2020-06-02 ENCOUNTER — Other Ambulatory Visit: Payer: Self-pay | Admitting: Internal Medicine

## 2020-06-29 ENCOUNTER — Other Ambulatory Visit: Payer: Self-pay | Admitting: Internal Medicine

## 2020-07-06 DIAGNOSIS — M47817 Spondylosis without myelopathy or radiculopathy, lumbosacral region: Secondary | ICD-10-CM | POA: Diagnosis not present

## 2020-07-06 DIAGNOSIS — M9903 Segmental and somatic dysfunction of lumbar region: Secondary | ICD-10-CM | POA: Diagnosis not present

## 2020-07-06 DIAGNOSIS — M9902 Segmental and somatic dysfunction of thoracic region: Secondary | ICD-10-CM | POA: Diagnosis not present

## 2020-07-06 DIAGNOSIS — M9905 Segmental and somatic dysfunction of pelvic region: Secondary | ICD-10-CM | POA: Diagnosis not present

## 2020-07-06 DIAGNOSIS — M5137 Other intervertebral disc degeneration, lumbosacral region: Secondary | ICD-10-CM | POA: Diagnosis not present

## 2020-07-07 ENCOUNTER — Encounter: Payer: Self-pay | Admitting: Family

## 2020-07-07 ENCOUNTER — Ambulatory Visit (INDEPENDENT_AMBULATORY_CARE_PROVIDER_SITE_OTHER): Payer: Medicare Other | Admitting: Family

## 2020-07-07 ENCOUNTER — Other Ambulatory Visit: Payer: Self-pay

## 2020-07-07 ENCOUNTER — Ambulatory Visit
Admission: RE | Admit: 2020-07-07 | Discharge: 2020-07-07 | Disposition: A | Discharge status: Home / Self Care | Payer: Medicare Other | Source: Ambulatory Visit | Attending: Family | Admitting: Family

## 2020-07-07 VITALS — BP 120/70 | HR 65 | Temp 97.1°F | Resp 16 | Ht 65.0 in | Wt 182.4 lb

## 2020-07-07 DIAGNOSIS — M545 Low back pain, unspecified: Secondary | ICD-10-CM

## 2020-07-07 DIAGNOSIS — M47814 Spondylosis without myelopathy or radiculopathy, thoracic region: Secondary | ICD-10-CM | POA: Diagnosis not present

## 2020-07-07 DIAGNOSIS — W19XXXA Unspecified fall, initial encounter: Secondary | ICD-10-CM | POA: Diagnosis not present

## 2020-07-07 DIAGNOSIS — Y92009 Unspecified place in unspecified non-institutional (private) residence as the place of occurrence of the external cause: Secondary | ICD-10-CM | POA: Diagnosis not present

## 2020-07-07 DIAGNOSIS — M546 Pain in thoracic spine: Secondary | ICD-10-CM | POA: Diagnosis not present

## 2020-07-07 DIAGNOSIS — H6123 Impacted cerumen, bilateral: Secondary | ICD-10-CM

## 2020-07-07 MED ORDER — ACETAMINOPHEN 500 MG PO TABS: 500.0000 mg | ORAL_TABLET | Freq: Three times a day (TID) | ORAL | 0 refills | Status: AC

## 2020-07-07 NOTE — Progress Notes (Signed)
Provider: Indya Oliveria FNP-C   Gayland Curry, DO  Patient Care Team: Alferd Apa as PCP - General (Geriatric Medicine)  Extended Emergency Contact Information Primary Emergency Contact: Debeer,Patricia Address: [address]          [place], [place] [address] Johnnette Litter of [place] Phone: [phone] Relation: Sister  Code Status:  Full Code  Goals of care: Advanced Directive information Advanced Directives 07/07/2020  Does Patient Have a Medical Advance Directive? Yes  Type of Advance Directive [place]  Does patient want to make changes to medical advance directive? No - Patient declined  Copy of [place] in Chart? Yes - validated most recent copy scanned in chart (See row information)  Would patient like information on creating a medical advance directive? -     Chief Complaint  Patient presents with   Acute Visit    [place] on 07/04/2020 and wants x-ray after seeing Chiropractor.   Concern    Moderate Fall Risk    HPI:  Pt is a 79 y.o. male seen today for an acute visit follow up fall on 07/04/2020.He is here with sister Elvina Mattes.she states patient lives with sister and family stays in the basement.He was moving his 40 lbs cabinet in his bedroom when the cabinet fell on his chest making fall on his bed frame.His bed frame hit him on the right lower and upper back.He complains of pain on the across the chest but pain worst on the right upper and lower back especially with movement or trying to sit. Sister states she applied a lidocaine patch to lower back on Saturday and gave aleve for pain.He has not used patch since then. He saw Chiropractic Ninette Marsocci D.C on 07/05/2020 who recommended for him to follow up with PCP to order X-ray of the back.  He denies hitting his head or loss of consciousness.Also denies any leg weakness,numbness,tingling,loss of bowel or bladder control.No shortness of breath or dyspnea.        Past Medical History:  Diagnosis Date   Arthritis    Chronic kidney disease    CKD- Stage  II   Colon cancer ([place])    2014   Colon polyps    adenomatous   Diabetes mellitus without complication ([facility])    Eczema    Hypercholesteremia    Hypertension    Past Surgical History:  Procedure Laterality Date   COLON SURGERY  2014 & 2015   resection of colon & removal of poylp ; Dr Kathleene Hazel, Dr Sherlene Shams   COLONOSCOPY  09/29/2013   COLONOSCOPY WITH PROPOFOL N/A 03/10/2015   Procedure: COLONOSCOPY WITH PROPOFOL;  Surgeon: Inda Castle, MD;  Location: [facility];  Service: Endoscopy;  Laterality: N/A;   [place] N/A 07/21/2014   Procedure: FLEXIBLE SIGMOIDOSCOPY;  Surgeon: Inda Castle, MD;  Location: [facility];  Service: Endoscopy;  Laterality: N/A;    No Known Allergies  Allergies as of 07/07/2020   No Known Allergies     Medication List       Accurate as of July 07, 2020 10:05 AM. If you have any questions, ask your nurse or doctor.        clotrimazole-betamethasone cream Commonly known as: Lotrisone Apply 1 application topically 2 (two) times daily.   diclofenac sodium 1 % Gel Commonly known as: VOLTAREN Apply 4 g topically 4 (four) times daily. To lower back   ferrous sulfate  325 (65 FE) MG tablet Take 325 mg by mouth daily.   finasteride 5 MG tablet Commonly known as: PROSCAR TAKE ONE TABLET BY MOUTH DAILY   glipiZIDE 5 MG 24 hr tablet Commonly known as: GLUCOTROL XL TAKE ONE TABLET BY MOUTH EVERY MORNING WITH BREAKFAST   lisinopril 5 MG tablet Commonly known as: ZESTRIL TAKE ONE TABLET BY MOUTH DAILY   loratadine 10 MG tablet Commonly known as: CLARITIN Take 1 tablet (10 mg total) by mouth daily.   metFORMIN 1000 MG tablet Commonly known as: GLUCOPHAGE TAKE ONE TABLET BY MOUTH DAILY WITH BREAKFAST   OneTouch Delica Plus [id number] Misc USE UP TO 4 TIMES A DAY AS DIRECTED    OneTouch Ultra test strip Generic drug: glucose blood USE TO CHECK BLOOD SUGARS ONCE DAILY AS DIRECTED FOR DIABETES   rosuvastatin 20 MG tablet Commonly known as: Crestor Take 1 tablet (20 mg total) by mouth daily.   tamsulosin 0.4 MG Caps capsule Commonly known as: FLOMAX TAKE ONE CAPSULE BY MOUTH DAILY   Tradjenta 5 MG Tabs tablet Generic drug: linagliptin TAKE ONE TABLET BY MOUTH DAILY   zinc sulfate 220 (50 Zn) MG capsule Take 220 mg by mouth daily.       Review of Systems  Constitutional: Negative for appetite change, chills, fatigue and fever.  HENT: Negative for congestion, rhinorrhea, sinus pressure, sinus pain, sneezing and sore throat.   Respiratory: Negative for cough, chest tightness, shortness of breath and wheezing.   Cardiovascular: Negative for chest pain, palpitations and leg swelling.  Gastrointestinal: Negative for abdominal distention, abdominal pain, constipation, diarrhea, nausea and vomiting.  Genitourinary: Negative for difficulty urinating, dysuria, flank pain, frequency and urgency.  Musculoskeletal: Positive for arthralgias, back pain and gait problem. Negative for joint swelling, myalgias and neck pain.  Skin: Negative for color change, pallor and rash.  Neurological: Negative for dizziness, speech difficulty, weakness, light-headedness, numbness and headaches.  Hematological: Does not bruise/bleed easily.  Psychiatric/Behavioral: Negative for agitation, behavioral problems and sleep disturbance. The patient is not nervous/anxious.     Immunization History  Administered Date(s) Administered   Fluad Quad(high Dose 65+) 03/25/2019, 05/14/2020   Influenza, High Dose Seasonal PF 04/27/2017, 04/30/2018   Influenza,inj,Quad PF,6+ Mos 05/14/2015, 06/03/2016   Influenza-Unspecified 05/26/2011, 08/15/2012, 05/01/2013   Pneumococcal Conjugate-13 08/13/2015   Pneumococcal Polysaccharide-23 03/12/2009   Tdap 02/07/2013   Zoster 01/03/2016    Pertinent  Health Maintenance Due  Topic Date Due   FOOT EXAM  10/14/2020   HEMOGLOBIN A1C  11/09/2020   OPHTHALMOLOGY EXAM  01/22/2021   INFLUENZA VACCINE  Completed   PNA vac Low Risk Adult  Completed   Fall Risk  07/07/2020 05/29/2020 05/14/2020 05/11/2020 04/21/2020  Falls in the past year? 1 0 0 0 0  Number falls in past yr: 0 0 0 0 0  Comment - - - - -  Injury with Fall? 1 0 0 - 0  Comment - - - - -   Functional Status Survey:    Vitals:   07/07/20 0931  BP: 120/70  Pulse: 65  Resp: 16  Temp: (!) 97.1 F (36.2 C)  SpO2: 97%  Weight: 182 lb 6.4 oz (82.7 kg)  Height: 5\' 5"  (1.651 m)   Body mass index is 30.35 kg/m. Physical Exam Vitals reviewed.  Constitutional:      General: He is not in acute distress.    Appearance: He is obese. He is not ill-appearing.  HENT:     Head: Normocephalic.  Right Ear: There is impacted cerumen.     Left Ear: There is impacted cerumen.     Ears:     Comments: Bilateral ear lavage with warm water and hydrogen peroxide.moderate amounts of cerumen removed using curette and Alligator forceps.Tolerated procedure well.No signs of infections noted.      Nose: Nose normal. No congestion or rhinorrhea.     Mouth/Throat:     Mouth: Mucous membranes are moist.     Pharynx: Oropharynx is clear. No oropharyngeal exudate or posterior oropharyngeal erythema.  Neck:     Vascular: No carotid bruit.  Cardiovascular:     Rate and Rhythm: Normal rate and regular rhythm.     Pulses: Normal pulses.     Heart sounds: Normal heart sounds. No murmur heard.  No friction rub. No gallop.   Pulmonary:     Effort: Pulmonary effort is normal. No respiratory distress.     Breath sounds: Normal breath sounds. No wheezing, rhonchi or rales.  Chest:     Chest wall: No tenderness.  Abdominal:     General: Bowel sounds are normal. There is no distension.     Palpations: Abdomen is soft. There is no mass.     Tenderness: There is no abdominal  tenderness. There is no right CVA tenderness, left CVA tenderness, guarding or rebound.  Musculoskeletal:        General: No swelling.     Cervical back: Normal range of motion. No rigidity or tenderness.     Thoracic back: Tenderness present. No lacerations. Normal range of motion.     Lumbar back: Tenderness present. Normal range of motion. Negative right straight leg raise test and negative left straight leg raise test.     Right lower leg: No edema.     Left lower leg: No edema.     Comments: Unsteady gait ambulates with a cane   Lymphadenopathy:     Cervical: No cervical adenopathy.  Skin:    General: Skin is warm and dry.     Coloration: Skin is not pale.     Findings: Rash present. No bruising or erythema.     Comments: No bruise noted on the chest or back   Neurological:     Mental Status: He is alert. Mental status is at baseline.     Cranial Nerves: No cranial nerve deficit.     Sensory: No sensory deficit.     Motor: No weakness.     Coordination: Coordination normal.     Gait: Gait abnormal.  Psychiatric:        Mood and Affect: Mood normal.        Behavior: Behavior normal.        Thought Content: Thought content normal.        Judgment: Judgment normal.    Labs reviewed: Recent Labs    09/04/19 0830 01/06/20 0837 05/11/20 0806  NA 138 139 139  K 4.4 4.7 5.0  CL 103 105 105  CO2 27 26 27   GLUCOSE 118* 83 91  BUN 28* 38* 42*  CREATININE 1.34* 1.35* 1.49*  CALCIUM 9.2 9.3 9.5   Recent Labs    09/04/19 0830  AST 18  ALT 16  BILITOT 0.7  PROT 7.0   Recent Labs    09/04/19 0830  WBC 7.1  NEUTROABS 4,416  HGB 13.3  HCT 39.6  MCV 87.6  PLT 159   No results found for: TSH Lab Results  Component Value Date   HGBA1C  5.9 (H) 05/11/2020   Lab Results  Component Value Date   CHOL 225 (H) 05/11/2020   HDL 46 05/11/2020   LDLCALC 150 (H) 05/11/2020   TRIG 156 (H) 05/11/2020   CHOLHDL 4.9 05/11/2020    Significant Diagnostic Results in last 30  days:  No results found.  Assessment/Plan 1. Acute bilateral low back pain without sciatica Status post fall after a 40 lbs cabinet fell on his chest 07/04/2020.lower back tender to palpation.Negative SLR. - Advised to take Extra strength tylenol as below - avoid lifting heavy loads until pain resolve. - will obtain imaging.Address given on AVS to get x-ray done at [facility] on [address] over [place].States familiar with the place.  - acetaminophen (TYLENOL) 500 MG tablet; Take 1 tablet (500 mg total) by mouth every 8 (eight) hours for 14 days.  Dispense: 42 tablet; Refill: 0 - DG Lumbar Spine Complete; Future  2. Acute right-sided thoracic back pain Sustained a fall as above cabinet hit him on the chest and also landed on bed frame on the right flank area. - will obtain imaging. - acetaminophen (TYLENOL) 500 MG tablet; Take 1 tablet (500 mg total) by mouth every 8 (eight) hours for 14 days.  Dispense: 42 tablet; Refill: 0 - DG Thoracic Spine W/Swimmers; Future  3. Fall at home, initial encounter Remains high risk for falls due to his cognition and unsteady gait.  - Fall and safety precaution advised  - DG Lumbar Spine Complete; Future - DG Thoracic Spine W/Swimmers; Future  4. Bilateral Cerumen impaction  Bilateral ear lavage with warm water and hydrogen peroxide.moderate amounts of cerumen removed using curette and Alligator forceps.Tolerated procedure well.No signs of infections noted.   Family/ staff Communication: Reviewed plan of care with patient and Sister Elvina Mattes   Labs/tests ordered:  - DG Lumbar Spine Complete; Future - DG Thoracic Spine W/Swimmers; Future  Next Appointment : As needed if symptoms worsen or fail to improve.   Sandrea Hughs, NP

## 2020-07-07 NOTE — Patient Instructions (Signed)
Please get back X-ray done at [facility] over [address].Will call you with results.

## 2020-08-01 ENCOUNTER — Other Ambulatory Visit: Payer: Self-pay | Admitting: Internal Medicine

## 2020-08-10 DIAGNOSIS — M9902 Segmental and somatic dysfunction of thoracic region: Secondary | ICD-10-CM | POA: Diagnosis not present

## 2020-08-10 DIAGNOSIS — M5137 Other intervertebral disc degeneration, lumbosacral region: Secondary | ICD-10-CM | POA: Diagnosis not present

## 2020-08-10 DIAGNOSIS — M47817 Spondylosis without myelopathy or radiculopathy, lumbosacral region: Secondary | ICD-10-CM | POA: Diagnosis not present

## 2020-08-10 DIAGNOSIS — M5134 Other intervertebral disc degeneration, thoracic region: Secondary | ICD-10-CM | POA: Diagnosis not present

## 2020-08-10 DIAGNOSIS — M9905 Segmental and somatic dysfunction of pelvic region: Secondary | ICD-10-CM | POA: Diagnosis not present

## 2020-08-10 DIAGNOSIS — M9903 Segmental and somatic dysfunction of lumbar region: Secondary | ICD-10-CM | POA: Diagnosis not present

## 2020-08-12 DIAGNOSIS — M9902 Segmental and somatic dysfunction of thoracic region: Secondary | ICD-10-CM | POA: Diagnosis not present

## 2020-08-12 DIAGNOSIS — M9905 Segmental and somatic dysfunction of pelvic region: Secondary | ICD-10-CM | POA: Diagnosis not present

## 2020-08-12 DIAGNOSIS — M9903 Segmental and somatic dysfunction of lumbar region: Secondary | ICD-10-CM | POA: Diagnosis not present

## 2020-08-12 DIAGNOSIS — M47817 Spondylosis without myelopathy or radiculopathy, lumbosacral region: Secondary | ICD-10-CM | POA: Diagnosis not present

## 2020-08-12 DIAGNOSIS — M5137 Other intervertebral disc degeneration, lumbosacral region: Secondary | ICD-10-CM | POA: Diagnosis not present

## 2020-08-12 DIAGNOSIS — M5134 Other intervertebral disc degeneration, thoracic region: Secondary | ICD-10-CM | POA: Diagnosis not present

## 2020-08-18 DIAGNOSIS — M9905 Segmental and somatic dysfunction of pelvic region: Secondary | ICD-10-CM | POA: Diagnosis not present

## 2020-08-18 DIAGNOSIS — M9902 Segmental and somatic dysfunction of thoracic region: Secondary | ICD-10-CM | POA: Diagnosis not present

## 2020-08-18 DIAGNOSIS — M5137 Other intervertebral disc degeneration, lumbosacral region: Secondary | ICD-10-CM | POA: Diagnosis not present

## 2020-08-18 DIAGNOSIS — M5134 Other intervertebral disc degeneration, thoracic region: Secondary | ICD-10-CM | POA: Diagnosis not present

## 2020-08-18 DIAGNOSIS — M9903 Segmental and somatic dysfunction of lumbar region: Secondary | ICD-10-CM | POA: Diagnosis not present

## 2020-08-18 DIAGNOSIS — M47817 Spondylosis without myelopathy or radiculopathy, lumbosacral region: Secondary | ICD-10-CM | POA: Diagnosis not present

## 2020-08-25 DIAGNOSIS — M9903 Segmental and somatic dysfunction of lumbar region: Secondary | ICD-10-CM | POA: Diagnosis not present

## 2020-08-25 DIAGNOSIS — M9902 Segmental and somatic dysfunction of thoracic region: Secondary | ICD-10-CM | POA: Diagnosis not present

## 2020-08-25 DIAGNOSIS — M47817 Spondylosis without myelopathy or radiculopathy, lumbosacral region: Secondary | ICD-10-CM | POA: Diagnosis not present

## 2020-08-25 DIAGNOSIS — M5137 Other intervertebral disc degeneration, lumbosacral region: Secondary | ICD-10-CM | POA: Diagnosis not present

## 2020-08-25 DIAGNOSIS — M9905 Segmental and somatic dysfunction of pelvic region: Secondary | ICD-10-CM | POA: Diagnosis not present

## 2020-08-25 DIAGNOSIS — M5134 Other intervertebral disc degeneration, thoracic region: Secondary | ICD-10-CM | POA: Diagnosis not present

## 2020-08-28 ENCOUNTER — Other Ambulatory Visit: Payer: Self-pay | Admitting: Internal Medicine

## 2020-08-28 ENCOUNTER — Other Ambulatory Visit: Payer: Self-pay | Admitting: *Deleted

## 2020-08-28 MED ORDER — TAMSULOSIN HCL 0.4 MG PO CAPS
0.4000 mg | ORAL_CAPSULE | Freq: Every day | ORAL | 1 refills | Status: DC
Start: 2020-08-28 — End: 2021-12-01

## 2020-08-28 NOTE — Telephone Encounter (Signed)
[employer] [facility] 

## 2020-09-01 DIAGNOSIS — M5137 Other intervertebral disc degeneration, lumbosacral region: Secondary | ICD-10-CM | POA: Diagnosis not present

## 2020-09-01 DIAGNOSIS — M47817 Spondylosis without myelopathy or radiculopathy, lumbosacral region: Secondary | ICD-10-CM | POA: Diagnosis not present

## 2020-09-01 DIAGNOSIS — M9905 Segmental and somatic dysfunction of pelvic region: Secondary | ICD-10-CM | POA: Diagnosis not present

## 2020-09-01 DIAGNOSIS — M9903 Segmental and somatic dysfunction of lumbar region: Secondary | ICD-10-CM | POA: Diagnosis not present

## 2020-09-01 DIAGNOSIS — M5134 Other intervertebral disc degeneration, thoracic region: Secondary | ICD-10-CM | POA: Diagnosis not present

## 2020-09-01 DIAGNOSIS — M9902 Segmental and somatic dysfunction of thoracic region: Secondary | ICD-10-CM | POA: Diagnosis not present

## 2020-09-02 ENCOUNTER — Encounter: Payer: Self-pay | Admitting: Family

## 2020-09-02 ENCOUNTER — Other Ambulatory Visit: Payer: Self-pay

## 2020-09-02 ENCOUNTER — Ambulatory Visit (INDEPENDENT_AMBULATORY_CARE_PROVIDER_SITE_OTHER): Payer: Medicare Other | Admitting: Family

## 2020-09-02 VITALS — BP 128/72 | HR 72 | Temp 97.1°F | Ht 65.0 in | Wt 189.2 lb

## 2020-09-02 DIAGNOSIS — H6123 Impacted cerumen, bilateral: Secondary | ICD-10-CM

## 2020-09-02 DIAGNOSIS — G8929 Other chronic pain: Secondary | ICD-10-CM

## 2020-09-02 DIAGNOSIS — M545 Low back pain, unspecified: Secondary | ICD-10-CM

## 2020-09-02 DIAGNOSIS — R2681 Unsteadiness on feet: Secondary | ICD-10-CM

## 2020-09-02 NOTE — Patient Instructions (Addendum)
- Home health Physical therapy ordered today therapist will call you for appoint,ment to evaluate your balance,exercise and muscle strengthening.  - Notify provider if having any fever,chills,pain in the ears or drainage.   Earwax Buildup, Adult The ears produce a substance called earwax that helps keep bacteria out of the ear and protects the skin in the ear canal. Occasionally, earwax can build up in the ear and cause discomfort or hearing loss. What are the causes? This condition is caused by a buildup of earwax. Ear canals are self-cleaning. Ear wax is made in the outer part of the ear canal and generally falls out in small amounts over time. When the self-cleaning mechanism is not working, earwax builds up and can cause decreased hearing and discomfort. Attempting to clean ears with cotton swabs can push the earwax deep into the ear canal and cause decreased hearing and pain. What increases the risk? This condition is more likely to develop in people who:  Clean their ears often with cotton swabs.  Pick at their ears.  Use earplugs or in-ear headphones often, or wear hearing aids. The following factors may also make you more likely to develop this condition:  Being male.  Being of older age.  Naturally producing more earwax.  Having narrow ear canals.  Having earwax that is overly thick or sticky.  Having excess hair in the ear canal.  Having eczema.  Being dehydrated. What are the signs or symptoms? Symptoms of this condition include:  Reduced or muffled hearing.  A feeling of fullness in the ear or feeling that the ear is plugged.  Fluid coming from the ear.  Ear pain or an itchy ear.  Ringing in the ear.  Coughing.  Balance problems.  An obvious piece of earwax that can be seen inside the ear canal. How is this diagnosed? This condition may be diagnosed based on:  Your symptoms.  Your medical history.  An ear exam. During the exam, your health care  provider will look into your ear with an instrument called an otoscope. You may have tests, including a hearing test. How is this treated? This condition may be treated by:  Using ear drops to soften the earwax.  Having the earwax removed by a health care provider. The health care provider may: ? Flush the ear with water. ? Use an instrument that has a loop on the end (curette). ? Use a suction device.  Having surgery to remove the wax buildup. This may be done in severe cases. Follow these instructions at home:  Take over-the-counter and prescription medicines only as told by your health care provider.  Do not put any objects, including cotton swabs, into your ear. You can clean the opening of your ear canal with a washcloth or facial tissue.  Follow instructions from your health care provider about cleaning your ears. Do not overclean your ears.  Drink enough fluid to keep your urine pale yellow. This will help to thin the earwax.  Keep all follow-up visits as told. If earwax builds up in your ears often or if you use hearing aids, consider seeing your health care provider for routine, preventive ear cleanings. Ask your health care provider how often you should schedule your cleanings.  If you have hearing aids, clean them according to instructions from the manufacturer and your health care provider.   Contact a health care provider if:  You have ear pain.  You develop a fever.  You have pus or other fluid  coming from your ear.  You have hearing loss.  You have ringing in your ears that does not go away.  You feel like the room is spinning (vertigo).  Your symptoms do not improve with treatment. Get help right away if:  You have bleeding from the affected ear.  You have severe ear pain. Summary  Earwax can build up in the ear and cause discomfort or hearing loss.  The most common symptoms of this condition include reduced or muffled hearing, a feeling of fullness  in the ear, or feeling that the ear is plugged.  This condition may be diagnosed based on your symptoms, your medical history, and an ear exam.  This condition may be treated by using ear drops to soften the earwax or by having the earwax removed by a health care provider.  Do not put any objects, including cotton swabs, into your ear. You can clean the opening of your ear canal with a washcloth or facial tissue. This information is not intended to replace advice given to you by your health care provider. Make sure you discuss any questions you have with your health care provider. Document Revised: 11/05/2019 Document Reviewed: 11/05/2019 Elsevier Patient Education  [place].

## 2020-09-02 NOTE — Progress Notes (Signed)
Provider: Aerionna Moravek FNP-C  Gayland Curry, DO  Patient Care Team: Alferd Apa as PCP - General (Geriatric Medicine)  Extended Emergency Contact Information Primary Emergency Contact: Debeer,Patricia Address: [address]          [place], [place] [address] Johnnette Litter of [place] Phone: [phone] Relation: Sister  Code Status:  Full Code  Goals of care: Advanced Directive information Advanced Directives 07/07/2020  Does Patient Have a Medical Advance Directive? Yes  Type of Advance Directive [place]  Does patient want to make changes to medical advance directive? No - Patient declined  Copy of [place] in Chart? Yes - validated most recent copy scanned in chart (See row information)  Would patient like information on creating a medical advance directive? -     Chief Complaint  Patient presents with  . Acute Visit    Patient presents to get his ears flushed.     HPI:  Pt is a 80 y.o. male seen today for an acute visit for ear lavage due to cerumen impaction. He is HOH bilateral.No fever,chills. No other URI's reported. Feels like ears are clogged up again.He would like ears lavaged.  Has been walking with a cane due to balance issues.States no fall episode but loss of  balance.    Past Medical History:  Diagnosis Date  . Arthritis   . Chronic kidney disease    CKD- Stage  II  . Colon cancer ([place])    2014  . Colon polyps    adenomatous  . Diabetes mellitus without complication ([place])   . Eczema   . Hypercholesteremia   . Hypertension    Past Surgical History:  Procedure Laterality Date  . COLON SURGERY  2014 & 2015   resection of colon & removal of poylp ; Dr Kathleene Hazel, Dr Sherlene Shams  . COLONOSCOPY  09/29/2013  . COLONOSCOPY WITH PROPOFOL N/A 03/10/2015   Procedure: COLONOSCOPY WITH PROPOFOL;  Surgeon: Inda Castle, MD;  [facility];  Service: Endoscopy;  Laterality: N/A;  . EXTERNAL EAR  SURGERY  1954  . FLEXIBLE SIGMOIDOSCOPY N/A 07/21/2014   Procedure: FLEXIBLE SIGMOIDOSCOPY;  Surgeon: Inda Castle, MD;  [facility];  Service: Endoscopy;  Laterality: N/A;    No Known Allergies  Outpatient Encounter Medications as of 09/02/2020  Medication Sig  . clotrimazole-betamethasone (LOTRISONE) cream Apply 1 application topically 2 (two) times daily.  . diclofenac sodium (VOLTAREN) 1 % GEL Apply 4 g topically 4 (four) times daily. To lower back  . ferrous sulfate 325 (65 FE) MG tablet Take 325 mg by mouth daily.  . finasteride (PROSCAR) 5 MG tablet TAKE ONE TABLET BY MOUTH DAILY  . glipiZIDE (GLUCOTROL XL) 5 MG 24 hr tablet TAKE ONE TABLET BY MOUTH EVERY MORNING WITH BREAKFAST  . glucose blood (ONETOUCH ULTRA) test strip USE TO CHECK BLOOD SUGARS ONCE DAILY AS DIRECTED FOR DIABETES  . Lancets (ONETOUCH DELICA PLUS [id number]) MISC USE UP TO 4 TIMES A DAY AS DIRECTED  . lisinopril (ZESTRIL) 5 MG tablet TAKE ONE TABLET BY MOUTH DAILY  . loratadine (CLARITIN) 10 MG tablet Take 1 tablet (10 mg total) by mouth daily.  . metFORMIN (GLUCOPHAGE) 1000 MG tablet TAKE ONE TABLET BY MOUTH DAILY WITH BREAKFAST  . rosuvastatin (CRESTOR) 20 MG tablet Take 1 tablet (20 mg total) by mouth daily.  . tamsulosin (FLOMAX) 0.4 MG CAPS capsule Take 1 capsule (0.4 mg total) by mouth daily.  [place]  5 MG TABS tablet TAKE ONE TABLET BY MOUTH DAILY  . zinc sulfate 220 (50 Zn) MG capsule Take 220 mg by mouth daily.    No facility-administered encounter medications on file as of 09/02/2020.    Review of Systems  Constitutional: Negative for appetite change, chills, fatigue and fever.  HENT: Positive for hearing loss. Negative for congestion, rhinorrhea, sinus pressure, sinus pain, sneezing and sore throat.   Respiratory: Negative for cough, chest tightness, shortness of breath and wheezing.   Cardiovascular: Negative for chest pain, palpitations and leg swelling.  Neurological: Negative for  dizziness, speech difficulty, light-headedness and headaches.    Immunization History  Administered Date(s) Administered  . Fluad Quad(high Dose 65+) 03/25/2019, 05/14/2020  . Influenza, High Dose Seasonal PF 04/27/2017, 04/30/2018  . Influenza,inj,Quad PF,6+ Mos 05/14/2015, 06/03/2016  . Influenza-Unspecified 05/26/2011, 08/15/2012, 05/01/2013  . PFIZER Comirnaty([place])Covid-19 Tri-Sucrose Vaccine 08/22/2019, 09/12/2019, 05/19/2020  . Pneumococcal Conjugate-13 08/13/2015  . Pneumococcal Polysaccharide-23 03/12/2009  . Tdap 02/07/2013  . Zoster 01/03/2016   Pertinent  Health Maintenance Due  Topic Date Due  . FOOT EXAM  10/14/2020  . HEMOGLOBIN A1C  11/09/2020  . OPHTHALMOLOGY EXAM  01/22/2021  . INFLUENZA VACCINE  Completed  . PNA vac Low Risk Adult  Completed   Fall Risk  09/02/2020 07/07/2020 05/29/2020 05/14/2020 05/11/2020  Falls in the past year? 0 1 0 0 0  Number falls in past yr: - 0 0 0 0  Comment - - - - -  Injury with Fall? - 1 0 0 -  Comment - - - - -   Functional Status Survey:    Vitals:   09/02/20 0935  Weight: 189 lb 3.2 oz (85.8 kg)  Height: 5\' 5"  (1.651 m)   Body mass index is 31.48 kg/m. Physical Exam Vitals reviewed.  Constitutional:      General: He is not in acute distress.    Appearance: He is obese. He is not ill-appearing.  HENT:     Head: Normocephalic.     Right Ear: There is impacted cerumen.     Left Ear: There is impacted cerumen.     Ears:     Comments: Bilateral ear lavaged with warm water and Hydrogen peroxide small amounts of cerumen removed on left ear using alligator forceps and curette.tolerated procedure well.    Nose: Nose normal. No congestion or rhinorrhea.     Mouth/Throat:     Mouth: Mucous membranes are moist.     Pharynx: Oropharynx is clear. No oropharyngeal exudate or posterior oropharyngeal erythema.  Eyes:     Comments: Sunglasses on during visit   Neck:     Vascular: No carotid bruit.  Cardiovascular:      Rate and Rhythm: Normal rate and regular rhythm.     Pulses: Normal pulses.     Heart sounds: Normal heart sounds. No murmur heard. No friction rub. No gallop.   Pulmonary:     Effort: Pulmonary effort is normal. No respiratory distress.     Breath sounds: Normal breath sounds. No wheezing, rhonchi or rales.  Chest:     Chest wall: No tenderness.  Musculoskeletal:        General: No swelling or tenderness.     Cervical back: Normal range of motion. No rigidity or tenderness.     Right lower leg: No edema.     Left lower leg: No edema.     Comments: Unsteady gait using a cane during visit   Lymphadenopathy:  Cervical: No cervical adenopathy.  Neurological:     Mental Status: He is alert. Mental status is at baseline.     Cranial Nerves: No cranial nerve deficit.     Sensory: No sensory deficit.     Motor: No weakness.     Coordination: Coordination normal.     Gait: Gait abnormal.  Psychiatric:        Mood and Affect: Mood normal.        Behavior: Behavior normal.     Labs reviewed: Recent Labs    09/04/19 0830 01/06/20 0837 05/11/20 0806  NA 138 139 139  K 4.4 4.7 5.0  CL 103 105 105  CO2 27 26 27   GLUCOSE 118* 83 91  BUN 28* 38* 42*  CREATININE 1.34* 1.35* 1.49*  CALCIUM 9.2 9.3 9.5   Recent Labs    09/04/19 0830  AST 18  ALT 16  BILITOT 0.7  PROT 7.0   Recent Labs    09/04/19 0830  WBC 7.1  NEUTROABS 4,416  HGB 13.3  HCT 39.6  MCV 87.6  PLT 159   No results found for: TSH Lab Results  Component Value Date   HGBA1C 5.9 (H) 05/11/2020   Lab Results  Component Value Date   CHOL 225 (H) 05/11/2020   HDL 46 05/11/2020   LDLCALC 150 (H) 05/11/2020   TRIG 156 (H) 05/11/2020   CHOLHDL 4.9 05/11/2020    Significant Diagnostic Results in last 30 days:  No results found.  Assessment/Plan 1. Bilateral impacted cerumen Bilateral ear lavaged with warm water and Hydrogen peroxide small amounts of cerumen removed on left ear using alligator  forceps and curette.tolerated procedure well.  - Ear Lavage  2. Unsteady gait Walks with a cane but gait unsteady lost balance during visit but no fall.  - Ambulatory referral to [facility] for ROM,exercise,gait stability and muscle strengthening  3. Chronic midline low back pain without sciatica Continue on OTC tylenol as needed for pain  - Ambulatory referral to [facility] as above.   Family/ staff Communication: Reviewed plan of care with patient  Labs/tests ordered: None   Next Appointment: Has upcoming appointment   Sandrea Hughs, NP

## 2020-09-06 ENCOUNTER — Other Ambulatory Visit: Payer: Self-pay | Admitting: Internal Medicine

## 2020-09-06 DIAGNOSIS — E1169 Type 2 diabetes mellitus with other specified complication: Secondary | ICD-10-CM

## 2020-09-06 DIAGNOSIS — E785 Hyperlipidemia, unspecified: Secondary | ICD-10-CM

## 2020-09-07 ENCOUNTER — Telehealth: Payer: Self-pay | Admitting: *Deleted

## 2020-09-07 NOTE — Telephone Encounter (Signed)
Received fax from pharmacy, Ammie Ferrier, wanting to clarify which cholesterol patient is taking.  We have Atorvastatin and Rosuvastatin in Current medication list.  Pharmacy stated that patient picked up #90 Rosuvastatin on 08/24/20. Pharmacy wants to know if this is a change and which he is suppose to be taking.    Placed a call to sister, Mardene Celeste [phone] and [facility] to return call regarding Clarification of medication. Awaiting callback.

## 2020-09-08 NOTE — Telephone Encounter (Signed)
Justin [name] is in [place] and will check patient's medication when she returns on Friday and will give [place] a call on Friday.

## 2020-09-14 NOTE — Telephone Encounter (Signed)
It should be ok.  His cholesterol should finally come down.

## 2020-09-14 NOTE — Telephone Encounter (Signed)
Mardene Celeste called and stated that patient has both medications, Atorvastatin and Rosuvastatin (not sure if he is taking both or not but they are in his current medications to take at home).   She and the pharmacy is wondering which one he should be taking. She stated that she is going to hold them out until she knows and then she will discard the other one.   Please Advise.

## 2020-09-14 NOTE — Telephone Encounter (Signed)
LM with Patricia's husband for Mardene Celeste to return call.

## 2020-09-14 NOTE — Telephone Encounter (Signed)
He is supposed to be on crestor 20mg  daily.  My last note has the lipitor discontinued and the crestor started.

## 2020-09-14 NOTE — Telephone Encounter (Signed)
Mardene Celeste notified and agreed.  Stated that patient has been taking both cholesterol medications and She wants to know if this is going to hurt him or if there is anything else they need to do.  Please Advise.    Medication list updated.

## 2020-09-14 NOTE — Telephone Encounter (Signed)
LMOM for Justin [name] to return call.

## 2020-09-15 DIAGNOSIS — M47817 Spondylosis without myelopathy or radiculopathy, lumbosacral region: Secondary | ICD-10-CM | POA: Diagnosis not present

## 2020-09-15 DIAGNOSIS — M5134 Other intervertebral disc degeneration, thoracic region: Secondary | ICD-10-CM | POA: Diagnosis not present

## 2020-09-15 DIAGNOSIS — M5137 Other intervertebral disc degeneration, lumbosacral region: Secondary | ICD-10-CM | POA: Diagnosis not present

## 2020-09-15 DIAGNOSIS — M9902 Segmental and somatic dysfunction of thoracic region: Secondary | ICD-10-CM | POA: Diagnosis not present

## 2020-09-15 DIAGNOSIS — M9903 Segmental and somatic dysfunction of lumbar region: Secondary | ICD-10-CM | POA: Diagnosis not present

## 2020-09-15 DIAGNOSIS — M9905 Segmental and somatic dysfunction of pelvic region: Secondary | ICD-10-CM | POA: Diagnosis not present

## 2020-09-15 NOTE — Telephone Encounter (Signed)
Mardene Celeste notified and agreed.

## 2020-09-21 ENCOUNTER — Encounter: Payer: Self-pay | Admitting: Internal Medicine

## 2020-09-24 ENCOUNTER — Other Ambulatory Visit: Payer: Self-pay | Admitting: Internal Medicine

## 2020-09-24 DIAGNOSIS — E1122 Type 2 diabetes mellitus with diabetic chronic kidney disease: Secondary | ICD-10-CM

## 2020-09-28 ENCOUNTER — Other Ambulatory Visit: Payer: Self-pay

## 2020-09-28 ENCOUNTER — Encounter: Payer: Self-pay | Admitting: Internal Medicine

## 2020-09-28 ENCOUNTER — Ambulatory Visit (INDEPENDENT_AMBULATORY_CARE_PROVIDER_SITE_OTHER): Payer: Medicare Other | Admitting: Internal Medicine

## 2020-09-28 VITALS — BP 130/80 | HR 73 | Temp 97.3°F | Ht 65.0 in | Wt 185.2 lb

## 2020-09-28 DIAGNOSIS — D500 Iron deficiency anemia secondary to blood loss (chronic): Secondary | ICD-10-CM

## 2020-09-28 DIAGNOSIS — G8929 Other chronic pain: Secondary | ICD-10-CM

## 2020-09-28 DIAGNOSIS — N182 Chronic kidney disease, stage 2 (mild): Secondary | ICD-10-CM | POA: Diagnosis not present

## 2020-09-28 DIAGNOSIS — I10 Essential (primary) hypertension: Secondary | ICD-10-CM

## 2020-09-28 DIAGNOSIS — H6121 Impacted cerumen, right ear: Secondary | ICD-10-CM | POA: Diagnosis not present

## 2020-09-28 DIAGNOSIS — E1122 Type 2 diabetes mellitus with diabetic chronic kidney disease: Secondary | ICD-10-CM

## 2020-09-28 DIAGNOSIS — M545 Low back pain, unspecified: Secondary | ICD-10-CM | POA: Diagnosis not present

## 2020-09-28 DIAGNOSIS — E782 Mixed hyperlipidemia: Secondary | ICD-10-CM

## 2020-09-28 MED ORDER — SITAGLIPTIN PHOSPHATE 100 MG PO TABS: 100.0000 mg | ORAL_TABLET | Freq: Every day | ORAL | 3 refills | Status: DC

## 2020-09-28 NOTE — Progress Notes (Signed)
Location:  [facility] clinic Provider:  Dontrae Morini L. Mariea Clonts, D.O., C.M.D.  Code Status: DNR Goals of Care:  Advanced Directives 09/28/2020  Does Justin Justin [name] Have a Medical Advance Directive? Yes  Type of Advance Directive Living will  Does Justin Justin [name] want to make changes to medical advance directive? No - Justin Justin [name] declined  Copy of [place] in Chart? -  Would Justin Justin [name] like information on creating a medical advance directive? -     Chief Complaint  Justin Justin [name] presents with  . Medical Management of Chronic Issues    4 month follow up.Justin Justin [name] concerned about fingers on left hand. Justin Justin [name] states they are cold and numb. He states that right hand feels much different than left. Unsure of which medications are being taken.Justin Justin [name] her with sister today.    HPI: Justin Justin [name] is a 80 y.o. Justin [name] seen today for medical management of chronic diseases.    C/o numbness of left three middle digits.  They burn in the mornings and sometimes bother him overnight.  His feet are not numb or tingly.  He was on two instead of one statin for a few months b/c both were somehow filled.  He's now back on crestor 20mg  since earlier this month.  Tricia counted 9 pills. [facility] [place] is very expensive for him.  More than $100.      Right ear feels full of wax.  Left feels ok.  Past Medical History:  Diagnosis Date  . Arthritis   . Chronic kidney disease    CKD- Stage  II  . Colon cancer ([place])    2014  . Colon polyps    adenomatous  . Diabetes mellitus without complication ([place])   . Eczema   . Hypercholesteremia   . Hypertension     Past Surgical History:  Procedure Laterality Date  . COLON SURGERY  2014 & 2015   resection of colon & removal of poylp ; Dr Kathleene Hazel, Dr Sherlene Shams  . COLONOSCOPY  09/29/2013  . COLONOSCOPY WITH PROPOFOL N/A 03/10/2015   Procedure: COLONOSCOPY WITH PROPOFOL;  Surgeon: Inda Castle, MD;  Location: [facility];  Service: Endoscopy;  Laterality: N/A;  . EXTERNAL EAR SURGERY   1954  . FLEXIBLE SIGMOIDOSCOPY N/A 07/21/2014   Procedure: FLEXIBLE SIGMOIDOSCOPY;  Surgeon: Inda Castle, MD;  Location: [facility];  Service: Endoscopy;  Laterality: N/A;    No Known Allergies  Outpatient Encounter Medications as of 09/28/2020  Medication Sig  . clotrimazole-betamethasone (LOTRISONE) cream Apply 1 application topically 2 (two) times daily.  . diclofenac sodium (VOLTAREN) 1 % GEL Apply 4 g topically 4 (four) times daily. To lower back  . ferrous sulfate 325 (65 FE) MG tablet Take 325 mg by mouth daily.  . finasteride (PROSCAR) 5 MG tablet TAKE ONE TABLET BY MOUTH DAILY  . glipiZIDE (GLUCOTROL XL) 5 MG 24 hr tablet TAKE ONE TABLET BY MOUTH EVERY MORNING WITH BREAKFAST  . glucose blood (ONETOUCH ULTRA) test strip USE TO CHECK BLOOD SUGARS ONCE DAILY AS DIRECTED FOR DIABETES  . Lancets (ONETOUCH DELICA PLUS [id number]) MISC USE UP TO 4 TIMES A DAY AS DIRECTED  . lisinopril (ZESTRIL) 5 MG tablet TAKE ONE TABLET BY MOUTH DAILY  . loratadine (CLARITIN) 10 MG tablet Take 1 tablet (10 mg total) by mouth daily.  . metFORMIN (GLUCOPHAGE) 1000 MG tablet TAKE ONE TABLET BY MOUTH DAILY WITH BREAKFAST  . rosuvastatin (CRESTOR) 20 MG tablet Take 1 tablet (20 mg total) by mouth daily.  . tamsulosin (FLOMAX) 0.4 MG CAPS capsule  Take 1 capsule (0.4 mg total) by mouth daily.  . TRADJENTA 5 MG TABS tablet TAKE ONE TABLET BY MOUTH DAILY  . zinc sulfate 220 (50 Zn) MG capsule Take 220 mg by mouth daily.    No facility-administered encounter medications on file as of 09/28/2020.    Review of Systems:  Review of Systems  Constitutional: Negative for chills, fever and malaise/fatigue.  HENT: Positive for hearing loss. Negative for congestion and sore throat.   Eyes:       Wears dark glasses, is following up with ophtho  Respiratory: Negative for cough and shortness of breath.   Cardiovascular: Negative for chest pain, palpitations and leg swelling.  Gastrointestinal: Negative for  abdominal pain, blood in stool, constipation and melena.  Genitourinary: Negative for dysuria.  Musculoskeletal: Positive for back pain. Negative for falls and joint pain.  Skin: Negative for itching and rash.  Neurological: Positive for tingling and sensory change. Negative for dizziness and loss of consciousness.  Endo/Heme/Allergies: Bruises/bleeds easily.  Psychiatric/Behavioral: Negative for depression. The Justin Justin [name] is not nervous/anxious.     Health Maintenance  Topic Date Due  . FOOT EXAM  10/14/2020  . HEMOGLOBIN A1C  11/09/2020  . OPHTHALMOLOGY EXAM  01/22/2021  . TETANUS/TDAP  02/08/2023  . INFLUENZA VACCINE  Completed  . COVID-19 Vaccine  Completed  . PNA vac Low Risk Adult  Completed    Physical Exam: Vitals:   09/28/20 1103  BP: 130/80  Pulse: 73  Temp: (!) 97.3 F (36.3 C)  TempSrc: Temporal  Weight: 185 lb 3.2 oz (84 kg)  Height: 5\' 5"  (1.651 m)   Body mass index is 30.82 kg/m. Physical Exam Vitals reviewed.  Constitutional:      Appearance: Normal appearance.  HENT:     Head: Normocephalic and atraumatic.     Ears:     Comments: Right ear cerumen impaction; left clear Eyes:     Conjunctiva/sclera: Conjunctivae normal.     Pupils: Pupils are equal, round, and reactive to light.     Comments: glasses  Cardiovascular:     Rate and Rhythm: Normal rate and regular rhythm.     Pulses: Normal pulses.     Heart sounds: Normal heart sounds.  Pulmonary:     Effort: Pulmonary effort is normal.     Breath sounds: Normal breath sounds. No wheezing, rhonchi or rales.  Abdominal:     General: Bowel sounds are normal.  Musculoskeletal:        General: Normal range of motion.     Right lower leg: No edema.     Left lower leg: No edema.     Comments: Low back tenderness  Skin:    General: Skin is warm and dry.  Neurological:     General: No focal deficit present.     Mental Status: He is alert.     Gait: Gait normal.     Comments: Jumps between subjects  when asked questions  Psychiatric:        Mood and Affect: Mood normal.        Behavior: Behavior normal.     Labs reviewed: Basic Metabolic Panel: Recent Labs    01/06/20 0837 05/11/20 0806  NA 139 139  K 4.7 5.0  CL 105 105  CO2 26 27  GLUCOSE 83 91  BUN 38* 42*  CREATININE 1.35* 1.49*  CALCIUM 9.3 9.5   Liver Function Tests: No results for input(s): AST, ALT, ALKPHOS, BILITOT, PROT, ALBUMIN in the last 8760  hours. No results for input(s): LIPASE, AMYLASE in the last 8760 hours. No results for input(s): AMMONIA in the last 8760 hours. CBC: No results for input(s): WBC, NEUTROABS, HGB, HCT, MCV, PLT in the last 8760 hours. Lipid Panel: Recent Labs    05/11/20 0806  CHOL 225*  HDL 46  LDLCALC 150*  TRIG 156*  CHOLHDL 4.9   Lab Results  Component Value Date   HGBA1C 5.9 (H) 05/11/2020    Procedures since last visit: No results found.  Assessment/Plan 1. Hearing loss of right ear due to cerumen impaction -right ear was flushed with warm water and peroxide so TM was visible again and he could hear a little better  2. Chronic midline low back pain without sciatica -may continue tylenol, heat, voltaren gel  3. Type 2 diabetes mellitus with stage 2 chronic kidney disease, without long-term current use of insulin (HCC) -cont glipizide, metfomin, crestor - Hemoglobin Y7X - BASIC METABOLIC PANEL WITH GFR - CBC with Differential/Platelet  4. Essential hypertension, benign -bp at goal. Cont same regimen - CBC with Differential/Platelet  5. Iron deficiency anemia due to chronic blood loss -cont iron therapy  6. Mixed hyperlipidemia -cont crestor not lipitor for his cholesterol--clarified med list again today  [name] was going to call back with which med was missing at home that is on his med list.  Labs/tests ordered:  Lab Orders  No laboratory test(s) ordered today   Next appt:  4 mos med mgt  Baley Shands L. Darlisa Spruiell, D.O. [facility] [address], [place] [address] Cell Phone (Mon-Fri 8am-5pm):  [phone] On Call:  [phone] & follow prompts after 5pm & weekends Office Phone:  [phone] Office Fax:  [phone]

## 2020-09-29 ENCOUNTER — Telehealth: Payer: Self-pay | Admitting: *Deleted

## 2020-09-29 DIAGNOSIS — M5137 Other intervertebral disc degeneration, lumbosacral region: Secondary | ICD-10-CM | POA: Diagnosis not present

## 2020-09-29 DIAGNOSIS — M9905 Segmental and somatic dysfunction of pelvic region: Secondary | ICD-10-CM | POA: Diagnosis not present

## 2020-09-29 DIAGNOSIS — M9903 Segmental and somatic dysfunction of lumbar region: Secondary | ICD-10-CM | POA: Diagnosis not present

## 2020-09-29 DIAGNOSIS — M47817 Spondylosis without myelopathy or radiculopathy, lumbosacral region: Secondary | ICD-10-CM | POA: Diagnosis not present

## 2020-09-29 DIAGNOSIS — M5134 Other intervertebral disc degeneration, thoracic region: Secondary | ICD-10-CM | POA: Diagnosis not present

## 2020-09-29 DIAGNOSIS — M9902 Segmental and somatic dysfunction of thoracic region: Secondary | ICD-10-CM | POA: Diagnosis not present

## 2020-09-29 LAB — BASIC METABOLIC PANEL WITH GFR
BUN/Creatinine Ratio: 40 (calc) — ABNORMAL HIGH (ref 6–22)
BUN: 54 mg/dL — ABNORMAL HIGH (ref 7–25)
CO2: 23 mmol/L (ref 20–32)
Calcium: 9.3 mg/dL (ref 8.6–10.3)
Chloride: 105 mmol/L (ref 98–110)
Creat: 1.36 mg/dL — ABNORMAL HIGH (ref 0.70–1.11)
GFR, Est African American: 57 mL/min/{1.73_m2} — ABNORMAL LOW (ref >=60)
GFR, Est Non African American: 49 mL/min/{1.73_m2} — ABNORMAL LOW (ref >=60)
Glucose, Bld: 48 mg/dL — ABNORMAL LOW (ref 65–139)
Potassium: 4.8 mmol/L (ref 3.5–5.3)
Sodium: 138 mmol/L (ref 135–146)

## 2020-09-29 LAB — CBC WITH DIFFERENTIAL/PLATELET
Absolute Monocytes: 970 cells/uL — ABNORMAL HIGH (ref 200–950)
Basophils Absolute: 39 cells/uL (ref 0–200)
Basophils Relative: 0.4 %
Eosinophils Absolute: 184 cells/uL (ref 15–500)
Eosinophils Relative: 1.9 %
HCT: 39.9 % (ref 38.5–50.0)
Hemoglobin: 13.5 g/dL (ref 13.2–17.1)
Lymphs Abs: 1882 cells/uL (ref 850–3900)
MCH: 29.9 pg (ref 27.0–33.0)
MCHC: 33.8 g/dL (ref 32.0–36.0)
MCV: 88.3 fL (ref 80.0–100.0)
MPV: 10 fL (ref 7.5–12.5)
Monocytes Relative: 10 %
Neutro Abs: 6625 cells/uL (ref 1500–7800)
Neutrophils Relative %: 68.3 %
Platelets: 192 10*3/uL (ref 140–400)
RBC: 4.52 10*6/uL (ref 4.20–5.80)
RDW: 12.6 % (ref 11.0–15.0)
Total Lymphocyte: 19.4 %
WBC: 9.7 10*3/uL (ref 3.8–10.8)

## 2020-09-29 LAB — HEMOGLOBIN A1C
Hgb A1c MFr Bld: 5.9 % of total Hgb — ABNORMAL HIGH (ref <5.7)
Mean Plasma Glucose: 123 mg/dL
eAG (mmol/L): 6.8 mmol/L

## 2020-09-29 NOTE — Telephone Encounter (Signed)
Patient sister, Larena Glassman, called and stated that patient had an appointment yesterday. She stated that the medication list had some discrepancy:   NOT TAKING: (on list) 1.) Claritin 10mg  2.) Zinc Sulfate  TAKING: (not on list) 1.) Docusate 100mg  daily  Larena Glassman wants to know if patient should be taking these Medications.   Please Advise.

## 2020-09-29 NOTE — Telephone Encounter (Signed)
We had discussed this, yes, but didn't know what he had and didn't have b/c he's come to appts alone all this time and does not know what he takes. So, it's ok for him to continue the docusate 100mg  daily  and stay off of claritin and zinc.  I also sent a lab result with more instructions.

## 2020-09-29 NOTE — Telephone Encounter (Signed)
Mardene Celeste, sister, notified and agreed.  Medication list updated.  Lab Results given and agreed.

## 2020-09-29 NOTE — Progress Notes (Signed)
Sugar average and kidneys are stable Blood counts are in normal range.   No changes needed to meds at this time. Stop iron if he has not already done this--there was a missing medication per [name] and she was going to call back with it because the number of pills on our list and the number he had did not match.  Please advise.

## 2020-10-02 ENCOUNTER — Ambulatory Visit (INDEPENDENT_AMBULATORY_CARE_PROVIDER_SITE_OTHER): Payer: Medicare Other | Admitting: Podiatry

## 2020-10-02 ENCOUNTER — Other Ambulatory Visit: Payer: Self-pay

## 2020-10-02 ENCOUNTER — Encounter: Payer: Self-pay | Admitting: Podiatry

## 2020-10-02 DIAGNOSIS — B351 Tinea unguium: Secondary | ICD-10-CM

## 2020-10-02 DIAGNOSIS — M2012 Hallux valgus (acquired), left foot: Secondary | ICD-10-CM

## 2020-10-02 DIAGNOSIS — N182 Chronic kidney disease, stage 2 (mild): Secondary | ICD-10-CM | POA: Diagnosis not present

## 2020-10-02 DIAGNOSIS — M79674 Pain in right toe(s): Secondary | ICD-10-CM

## 2020-10-02 DIAGNOSIS — M79675 Pain in left toe(s): Secondary | ICD-10-CM | POA: Diagnosis not present

## 2020-10-02 DIAGNOSIS — M2011 Hallux valgus (acquired), right foot: Secondary | ICD-10-CM

## 2020-10-02 DIAGNOSIS — E1122 Type 2 diabetes mellitus with diabetic chronic kidney disease: Secondary | ICD-10-CM

## 2020-10-02 NOTE — Progress Notes (Signed)
Subjective: Justin [name] presents today for follow up of preventative diabetic foot care, for diabetic foot evaluation and painful mycotic nails b/l that are difficult to trim. Pain interferes with ambulation. Aggravating factors include wearing enclosed shoe gear. Pain is relieved with periodic professional debridement.   Blood glucose was 120 mg/dl this morning.  PCP is Dr. Hollace Kinnier. Last visit was 09/28/2020.  He voices no new pedal concerns on today's visit.  No Known Allergies   Objective: There were no vitals filed for this visit.  Pt 80 y.o. year old Caucasian  male  WD, WN in NAD. AAO x 3.   Vascular Examination:  Capillary refill time to digits immediate b/l. Palpable DP pulses b/l. Palpable PT pulses b/l. Pedal hair present b/l. Skin temperature gradient within normal limits b/l.  Dermatological Examination: Pedal skin with normal turgor, texture and tone bilaterally. No open wounds bilaterally. No interdigital macerations bilaterally. Toenails 1-5 b/l elongated, dystrophic, thickened, crumbly with subungual debris and tenderness to dorsal palpation.  Musculoskeletal: Muscle strength 5/5 to all LE muscle groups of b/l lower extremities. No pain crepitus or joint limitation noted with ROM b/l. Utilizes cane for ambulation assistance.  Neurological: Protective sensation intact 5/5 intact bilaterally with 10g monofilament b/l Vibratory sensation intact b/l  Assessment: 1. 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 Hallux valgus, acquired, bilateral   3. Type 2 diabetes mellitus with stage 2 chronic kidney disease, without long-term current use of insulin ([place])    Plan: -Patient examined.  -No new problems. No new orders on today's visit. -Continue diabetic foot care principles. -Toenails 1-5 b/l were debrided in length and girth with sterile nail nippers and dremel without iatrogenic bleeding.  -Patient to continue soft, supportive shoe gear  daily. -Patient to report any pedal injuries to medical professional immediately. -Patient/POA to call should there be question/concern in the interim.  Return in about 3 months (around 01/02/2021).

## 2020-10-13 DIAGNOSIS — M9903 Segmental and somatic dysfunction of lumbar region: Secondary | ICD-10-CM | POA: Diagnosis not present

## 2020-10-13 DIAGNOSIS — M47817 Spondylosis without myelopathy or radiculopathy, lumbosacral region: Secondary | ICD-10-CM | POA: Diagnosis not present

## 2020-10-13 DIAGNOSIS — M9905 Segmental and somatic dysfunction of pelvic region: Secondary | ICD-10-CM | POA: Diagnosis not present

## 2020-10-13 DIAGNOSIS — M9902 Segmental and somatic dysfunction of thoracic region: Secondary | ICD-10-CM | POA: Diagnosis not present

## 2020-10-13 DIAGNOSIS — M5137 Other intervertebral disc degeneration, lumbosacral region: Secondary | ICD-10-CM | POA: Diagnosis not present

## 2020-10-13 DIAGNOSIS — M5134 Other intervertebral disc degeneration, thoracic region: Secondary | ICD-10-CM | POA: Diagnosis not present

## 2020-10-14 ENCOUNTER — Other Ambulatory Visit: Payer: Self-pay | Admitting: Internal Medicine

## 2020-10-27 DIAGNOSIS — M9905 Segmental and somatic dysfunction of pelvic region: Secondary | ICD-10-CM | POA: Diagnosis not present

## 2020-10-27 DIAGNOSIS — M5137 Other intervertebral disc degeneration, lumbosacral region: Secondary | ICD-10-CM | POA: Diagnosis not present

## 2020-10-27 DIAGNOSIS — M5134 Other intervertebral disc degeneration, thoracic region: Secondary | ICD-10-CM | POA: Diagnosis not present

## 2020-10-27 DIAGNOSIS — M9902 Segmental and somatic dysfunction of thoracic region: Secondary | ICD-10-CM | POA: Diagnosis not present

## 2020-10-27 DIAGNOSIS — M9903 Segmental and somatic dysfunction of lumbar region: Secondary | ICD-10-CM | POA: Diagnosis not present

## 2020-10-27 DIAGNOSIS — M47817 Spondylosis without myelopathy or radiculopathy, lumbosacral region: Secondary | ICD-10-CM | POA: Diagnosis not present

## 2020-11-04 ENCOUNTER — Other Ambulatory Visit: Payer: Self-pay

## 2020-11-04 DIAGNOSIS — E1122 Type 2 diabetes mellitus with diabetic chronic kidney disease: Secondary | ICD-10-CM

## 2020-11-04 MED ORDER — METFORMIN HCL 1000 MG PO TABS: 1.0000 | ORAL_TABLET | Freq: Every day | ORAL | 1 refills | Status: DC

## 2020-11-06 ENCOUNTER — Other Ambulatory Visit: Payer: Self-pay | Admitting: Internal Medicine

## 2020-11-26 ENCOUNTER — Other Ambulatory Visit: Payer: Self-pay | Admitting: *Deleted

## 2020-11-26 DIAGNOSIS — E782 Mixed hyperlipidemia: Secondary | ICD-10-CM

## 2020-11-26 MED ORDER — ROSUVASTATIN CALCIUM 20 MG PO TABS: 20.0000 mg | ORAL_TABLET | Freq: Every day | ORAL | 1 refills | Status: DC

## 2020-11-26 NOTE — Telephone Encounter (Signed)
Patient sister called and stated that the pharmacy refilled Atorvastatin for patient but patient does not take Atorvastatin he is only taking Rosuvastatin.  Updated Rx sent to pharmacy per request.

## 2020-11-27 ENCOUNTER — Ambulatory Visit (INDEPENDENT_AMBULATORY_CARE_PROVIDER_SITE_OTHER): Payer: Medicare Other | Admitting: Nurse Practitioner

## 2020-11-27 ENCOUNTER — Other Ambulatory Visit: Payer: Self-pay

## 2020-11-27 ENCOUNTER — Encounter: Payer: Self-pay | Admitting: Nurse Practitioner

## 2020-11-27 VITALS — BP 110/80 | HR 56 | Temp 96.6°F | Ht 65.0 in | Wt 186.2 lb

## 2020-11-27 DIAGNOSIS — H6122 Impacted cerumen, left ear: Secondary | ICD-10-CM | POA: Diagnosis not present

## 2020-11-27 NOTE — Progress Notes (Signed)
Careteam: Patient Care Team: Ngetich, Nelda Bucks, NP as PCP - General (Family Medicine)  PLACE OF SERVICE:  [place] Directive information    No Known Allergies  Chief Complaint  Patient presents with  . Acute Visit    Patient would like ears cleaned especially hi left ear.     HPI: Patient is a 80 y.o. male due to fullness in ears.  Reports left ear feels really full and feels like there is wax build up.  He is HOH.  No pain or drainage noted.   Review of Systems:  Review of Systems  HENT: Positive for hearing loss.     Past Medical History:  Diagnosis Date  . Arthritis   . Chronic kidney disease    CKD- Stage  II  . Colon cancer ([place])    2014  . Colon polyps    adenomatous  . Diabetes mellitus without complication ([place])   . Eczema   . Hypercholesteremia   . Hypertension    Past Surgical History:  Procedure Laterality Date  . COLON SURGERY  2014 & 2015   resection of colon & removal of poylp ; Dr Kathleene Hazel, Dr Sherlene Shams  . COLONOSCOPY  09/29/2013  . COLONOSCOPY WITH PROPOFOL N/A 03/10/2015   Procedure: COLONOSCOPY WITH PROPOFOL;  Surgeon: Inda Castle, MD;  Location: [facility];  Service: Endoscopy;  Laterality: N/A;  . EXTERNAL EAR SURGERY  1954  . FLEXIBLE SIGMOIDOSCOPY N/A 07/21/2014   Procedure: FLEXIBLE SIGMOIDOSCOPY;  Surgeon: Inda Castle, MD;  Location: [facility];  Service: Endoscopy;  Laterality: N/A;   Social History:   reports that he has never smoked. He has never used smokeless tobacco. He reports current alcohol use of about 7.0 standard drinks of alcohol per week. He reports that he does not use drugs.  Family History  Problem Relation Age of Onset  . Varicose Veins Mother   . Stroke Mother   . Glaucoma Mother   . Kidney disease Mother   . Hypertension Father   . Heart attack Father   . Cancer Brother        testicular cancer  . Hyperlipidemia Sister   . Meniere's disease Sister     Medications: Patient's  Medications  New Prescriptions   No medications on file  Previous Medications   CLOTRIMAZOLE-BETAMETHASONE (LOTRISONE) CREAM    Apply 1 application topically 2 (two) times daily.   DICLOFENAC SODIUM (VOLTAREN) 1 % GEL    Apply 4 g topically 4 (four) times daily. To lower back   DOCUSATE SODIUM (COLACE) 100 MG CAPSULE    Take 100 mg by mouth daily.   FINASTERIDE (PROSCAR) 5 MG TABLET    TAKE ONE TABLET BY MOUTH DAILY   GLIPIZIDE (GLUCOTROL XL) 5 MG 24 HR TABLET    TAKE ONE TABLET BY MOUTH EVERY MORNING WITH BREAKFAST   GLUCOSE BLOOD (ONETOUCH ULTRA) TEST STRIP    USE TO CHECK BLOOD SUGARS ONCE DAILY AS DIRECTED FOR DIABETES   LANCETS (ONETOUCH DELICA PLUS [id number]) MISC    USE UP TO 4 TIMES A DAY AS DIRECTED   LISINOPRIL (ZESTRIL) 5 MG TABLET    TAKE ONE TABLET BY MOUTH DAILY   METFORMIN (GLUCOPHAGE) 1000 MG TABLET    Take 1 tablet (1,000 mg total) by mouth daily with breakfast.   ROSUVASTATIN (CRESTOR) 20 MG TABLET    Take 1 tablet (20 mg total) by mouth daily.   SITAGLIPTIN (JANUVIA) 100 MG TABLET  Take 1 tablet (100 mg total) by mouth daily.   TAMSULOSIN (FLOMAX) 0.4 MG CAPS CAPSULE    Take 1 capsule (0.4 mg total) by mouth daily.  Modified Medications   No medications on file  Discontinued Medications   No medications on file    Physical Exam:  Vitals:   11/27/20 0822  BP: 110/80  Pulse: (!) 56  Temp: (!) 96.6 F (35.9 C)  TempSrc: Temporal  SpO2: 97%  Weight: 186 lb 3.2 oz (84.5 kg)  Height: 5\' 5"  (1.651 m)   Body mass index is 30.99 kg/m. Wt Readings from Last 3 Encounters:  11/27/20 186 lb 3.2 oz (84.5 kg)  09/28/20 185 lb 3.2 oz (84 kg)  09/02/20 189 lb 3.2 oz (85.8 kg)    Physical Exam Constitutional:      Appearance: Normal appearance.  HENT:     Right Ear: Tympanic membrane normal.     Left Ear: There is impacted cerumen.  Skin:    General: Skin is warm and dry.  Neurological:     Mental Status: He is alert.     Labs reviewed: Basic Metabolic  Panel: Recent Labs    01/06/20 0837 05/11/20 0806 09/28/20 1233  NA 139 139 138  K 4.7 5.0 4.8  CL 105 105 105  CO2 26 27 23   GLUCOSE 83 91 48*  BUN 38* 42* 54*  CREATININE 1.35* 1.49* 1.36*  CALCIUM 9.3 9.5 9.3   Liver Function Tests: No results for input(s): AST, ALT, ALKPHOS, BILITOT, PROT, ALBUMIN in the last 8760 hours. No results for input(s): LIPASE, AMYLASE in the last 8760 hours. No results for input(s): AMMONIA in the last 8760 hours. CBC: Recent Labs    09/28/20 1233  WBC 9.7  NEUTROABS 6,625  HGB 13.5  HCT 39.9  MCV 88.3  PLT 192   Lipid Panel: Recent Labs    05/11/20 0806  CHOL 225*  HDL 46  LDLCALC 150*  TRIG 156*  CHOLHDL 4.9   TSH: No results for input(s): TSH in the last 8760 hours. A1C: Lab Results  Component Value Date   HGBA1C 5.9 (H) 09/28/2020     Assessment/Plan 1. Left ear impacted cerumen Removal of impacted cerumen via Ear lavage and pt tolerated well.  - Ear Lavage  Carlos American. [place], [facility] [phone]

## 2020-12-03 ENCOUNTER — Encounter (HOSPITAL_COMMUNITY): Payer: Self-pay

## 2020-12-03 ENCOUNTER — Emergency Department (HOSPITAL_COMMUNITY): Payer: Medicare Other

## 2020-12-03 ENCOUNTER — Emergency Department (HOSPITAL_COMMUNITY)
Admission: EM | Admit: 2020-12-03 | Discharge: 2020-12-03 | Disposition: A | Discharge status: Home / Self Care | Payer: Medicare Other | Attending: Emergency Medicine | Admitting: Emergency Medicine

## 2020-12-03 ENCOUNTER — Other Ambulatory Visit: Payer: Self-pay

## 2020-12-03 DIAGNOSIS — E1122 Type 2 diabetes mellitus with diabetic chronic kidney disease: Secondary | ICD-10-CM | POA: Diagnosis not present

## 2020-12-03 DIAGNOSIS — M25822 Other specified joint disorders, left elbow: Secondary | ICD-10-CM | POA: Diagnosis not present

## 2020-12-03 DIAGNOSIS — Y92019 Unspecified place in single-family (private) house as the place of occurrence of the external cause: Secondary | ICD-10-CM | POA: Insufficient documentation

## 2020-12-03 DIAGNOSIS — I129 Hypertensive chronic kidney disease with stage 1 through stage 4 chronic kidney disease, or unspecified chronic kidney disease: Secondary | ICD-10-CM | POA: Diagnosis not present

## 2020-12-03 DIAGNOSIS — M79675 Pain in left toe(s): Secondary | ICD-10-CM | POA: Diagnosis not present

## 2020-12-03 DIAGNOSIS — S59902A Unspecified injury of left elbow, initial encounter: Secondary | ICD-10-CM | POA: Diagnosis present

## 2020-12-03 DIAGNOSIS — S51012A Laceration without foreign body of left elbow, initial encounter: Secondary | ICD-10-CM | POA: Insufficient documentation

## 2020-12-03 DIAGNOSIS — Z85038 Personal history of other malignant neoplasm of large intestine: Secondary | ICD-10-CM | POA: Diagnosis not present

## 2020-12-03 DIAGNOSIS — S01111A Laceration without foreign body of right eyelid and periocular area, initial encounter: Secondary | ICD-10-CM | POA: Insufficient documentation

## 2020-12-03 DIAGNOSIS — Z7984 Long term (current) use of oral hypoglycemic drugs: Secondary | ICD-10-CM | POA: Diagnosis not present

## 2020-12-03 DIAGNOSIS — W19XXXA Unspecified fall, initial encounter: Secondary | ICD-10-CM

## 2020-12-03 DIAGNOSIS — S60222A Contusion of left hand, initial encounter: Secondary | ICD-10-CM | POA: Diagnosis not present

## 2020-12-03 DIAGNOSIS — W010XXA Fall on same level from slipping, tripping and stumbling without subsequent striking against object, initial encounter: Secondary | ICD-10-CM | POA: Diagnosis not present

## 2020-12-03 DIAGNOSIS — N182 Chronic kidney disease, stage 2 (mild): Secondary | ICD-10-CM | POA: Insufficient documentation

## 2020-12-03 DIAGNOSIS — S0990XA Unspecified injury of head, initial encounter: Secondary | ICD-10-CM | POA: Insufficient documentation

## 2020-12-03 DIAGNOSIS — D649 Anemia, unspecified: Secondary | ICD-10-CM | POA: Insufficient documentation

## 2020-12-03 DIAGNOSIS — Z79899 Other long term (current) drug therapy: Secondary | ICD-10-CM | POA: Insufficient documentation

## 2020-12-03 LAB — CBC WITH DIFFERENTIAL/PLATELET
Abs Immature Granulocytes: 0.06 10*3/uL (ref 0.00–0.07)
Basophils Absolute: 0.1 10*3/uL (ref 0.0–0.1)
Basophils Relative: 1 %
Eosinophils Absolute: 0.1 10*3/uL (ref 0.0–0.5)
Eosinophils Relative: 2 %
HCT: 37.8 % — ABNORMAL LOW (ref 39.0–52.0)
Hemoglobin: 12.1 g/dL — ABNORMAL LOW (ref 13.0–17.0)
Immature Granulocytes: 1 %
Lymphocytes Relative: 13 %
Lymphs Abs: 1 10*3/uL (ref 0.7–4.0)
MCH: 29.2 pg (ref 26.0–34.0)
MCHC: 32 g/dL (ref 30.0–36.0)
MCV: 91.3 fL (ref 80.0–100.0)
Monocytes Absolute: 0.5 10*3/uL (ref 0.1–1.0)
Monocytes Relative: 6 %
Neutro Abs: 5.8 10*3/uL (ref 1.7–7.7)
Neutrophils Relative %: 77 %
Platelets: 141 10*3/uL — ABNORMAL LOW (ref 150–400)
RBC: 4.14 MIL/uL — ABNORMAL LOW (ref 4.22–5.81)
RDW: 13.2 % (ref 11.5–15.5)
WBC: 7.5 10*3/uL (ref 4.0–10.5)
nRBC: 0 % (ref 0.0–0.2)

## 2020-12-03 LAB — COMPREHENSIVE METABOLIC PANEL
ALT: 19 U/L (ref 0–44)
AST: 23 U/L (ref 15–41)
Albumin: 3.6 g/dL (ref 3.5–5.0)
Alkaline Phosphatase: 61 U/L (ref 38–126)
Anion gap: 6 (ref 5–15)
BUN: 45 mg/dL — ABNORMAL HIGH (ref 8–23)
CO2: 21 mmol/L — ABNORMAL LOW (ref 22–32)
Calcium: 8.8 mg/dL — ABNORMAL LOW (ref 8.9–10.3)
Chloride: 108 mmol/L (ref 98–111)
Creatinine, Ser: 1.46 mg/dL — ABNORMAL HIGH (ref 0.61–1.24)
GFR, Estimated: 48 mL/min — ABNORMAL LOW (ref >60)
Glucose, Bld: 129 mg/dL — ABNORMAL HIGH (ref 70–99)
Potassium: 5.1 mmol/L (ref 3.5–5.1)
Sodium: 135 mmol/L (ref 135–145)
Total Bilirubin: 0.8 mg/dL (ref 0.3–1.2)
Total Protein: 6.4 g/dL — ABNORMAL LOW (ref 6.5–8.1)

## 2020-12-03 LAB — TROPONIN I (HIGH SENSITIVITY)
Troponin I (High Sensitivity): 5 ng/L (ref <18)
Troponin I (High Sensitivity): 5 ng/L (ref <18)

## 2020-12-03 LAB — POC OCCULT BLOOD, ED: Fecal Occult Bld: NEGATIVE

## 2020-12-03 MED ORDER — SODIUM CHLORIDE 0.9 % IV BOLUS
500.0000 mL | Freq: Once | INTRAVENOUS | Status: AC
  Administered 2020-12-03: 500 mL via INTRAVENOUS

## 2020-12-03 NOTE — ED Triage Notes (Signed)
nonwitnessed fall at home, lives with sister, EMS states pt did not lose consciousness

## 2020-12-03 NOTE — ED Provider Notes (Signed)
[facility] EMERGENCY DEPARTMENT Provider Note   CSN: [id number] Arrival date & time: 12/03/20  0818     History Chief Complaint  Patient presents with  . Fall    Non witnessed fall, not on thinners, baseline is time forgetfullness, lives with sister, skin tear on left elbow, hematoma left hand  . Laceration    Lac right eyebrow, hematoma left hand and head, skin tear to head    Justin [name] is a 80 y.o. male.  HPI  Patient presents after a fall.  Patient initially stated that he slipped getting out of the shower.  Patient's sister later showed up.  States that the patient got dizzy and passed out.  Reportedly was lost consciousness.  Had been feeling dizzy.  Took an Antivert prior to the fall.  Complaining of pain in his head left elbow and left great toe.  Not on blood thinners.  No chest pain.  No abdominal pain.  Patient reportedly has some confusion at baseline but is acting at his baseline at this time.     Past Medical History:  Diagnosis Date  . Arthritis   . Chronic kidney disease    CKD- Stage  II  . Colon cancer ([place])    2014  . Colon polyps    adenomatous  . Diabetes mellitus without complication ([place])   . Eczema   . Hypercholesteremia   . Hypertension     Patient Active Problem List   Diagnosis Date Noted  . Dizziness 10/15/2019  . 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 Bilateral impacted cerumen 06/10/2019  . 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  . Seasonal allergies 03/25/2019  . Mid back pain 03/25/2014  . Tubular adenoma of colon 03/25/2014  . CN (constipation) 03/25/2014  . Type 2 diabetes mellitus with stage 2 chronic kidney disease, without long-term current use of insulin ([place]) 03/25/2014  . Anemia, iron deficiency 03/25/2014  . Essential hypertension, benign 03/25/2014  . Hyperlipidemia 03/25/2014  . Personal history of colon cancer, stage II 03/25/2014  . Prostate nodule without urinary  obstruction 03/25/2014    Past Surgical History:  Procedure Laterality Date  . COLON SURGERY  2014 & 2015   resection of colon & removal of poylp ; Dr Kathleene Haz[facility], Dr Sherlene Shams  . COLONOSCOPY  09/29/2013  . COLONOSCOPY WITH PROPOFOL N/A 03/10/2015   Procedure: COLONOSCOPY WITH PROPOFOL;  Surgeon: Inda Castle, MD;  Location: [facility];  Service: Endoscopy;  Laterality: N/A;  . EXTERNAL EAR SURGERY  1954  . FLEXIBLE SIGMOIDOSCOPY N/A 07/21/2014   Procedure: FLEXIBLE SIGMOIDOSCOPY;  Surgeon: Inda Castle, MD;  Location: [facility];  Service: Endoscopy;  Laterality: N/A;       Family History  Problem Relation Age of Onset  . Varicose Veins Mother   . Stroke Mother   . Glaucoma Mother   . Kidney disease Mother   . Hypertension Father   . Heart attack Father   . Cancer Brother        testicular cancer  . Hyperlipidemia Sister   . Meniere's disease Sister     Social History   Tobacco Use  . Smoking status: Never Smoker  . Smokeless tobacco: Never Used  Vaping Use  . Vaping Use: Never used  Substance Use Topics  . Alcohol use: Yes    Alcohol/week: 7.0 standard drinks    Types: 7 Cans of beer per week    Comment: weekly  . Drug use: No  Home Medications Prior to Admission medications   Medication Sig Start Date End Date Taking? Authorizing Provider  clotrimazole-betamethasone (LOTRISONE) cream Apply 1 application topically 2 (two) times daily. 01/09/20   Reed, Tiffany L, DO  diclofenac sodium (VOLTAREN) 1 % GEL Apply 4 g topically 4 (four) times daily. To lower back 02/04/19   Reed, Tiffany L, DO  docusate sodium (COLACE) 100 MG capsule Take 100 mg by mouth daily.    [provider]  finasteride (PROSCAR) 5 MG tablet TAKE ONE TABLET BY MOUTH DAILY 11/06/20   Ngetich, Dinah C, NP  glipiZIDE (GLUCOTROL XL) 5 MG 24 hr tablet TAKE ONE TABLET BY MOUTH EVERY MORNING WITH BREAKFAST 10/14/20   Reed, Tiffany L, DO  glucose blood (ONETOUCH ULTRA) test strip USE TO CHECK  BLOOD SUGARS ONCE DAILY AS DIRECTED FOR DIABETES 01/09/20   Reed, Tiffany L, DO  Lancets (ONETOUCH DELICA PLUS [id number]) MISC USE UP TO 4 TIMES A DAY AS DIRECTED 09/24/20   Reed, Tiffany L, DO  lisinopril (ZESTRIL) 5 MG tablet TAKE ONE TABLET BY MOUTH DAILY 11/06/20   Ngetich, Dinah C, NP  metFORMIN (GLUCOPHAGE) 1000 MG tablet Take 1 tablet (1,000 mg total) by mouth daily with breakfast. 11/04/20   Ngetich, Dinah C, NP  rosuvastatin (CRESTOR) 20 MG tablet Take 1 tablet (20 mg total) by mouth daily. 11/26/20   Ngetich, Dinah C, NP  sitaGLIPtin (JANUVIA) 100 MG tablet Take 1 tablet (100 mg total) by mouth daily. 09/28/20   Reed, Tiffany L, DO  tamsulosin (FLOMAX) 0.4 MG CAPS capsule Take 1 capsule (0.4 mg total) by mouth daily. 08/28/20   Reed, Tiffany L, DO    Allergies    Patient has no known allergies.  Review of Systems   Review of Systems  Constitutional: Negative for appetite change.  HENT: Negative for congestion.   Respiratory: Negative for shortness of breath.   Gastrointestinal: Negative for abdominal pain.  Genitourinary: Negative for flank pain.  Musculoskeletal:       Left great toe pain.  Skin: Positive for wound.  Neurological: Positive for dizziness, syncope and headaches. Negative for weakness.  Psychiatric/Behavioral: Positive for confusion.    Physical Exam Updated Vital Signs BP 107/88   Pulse 76   Temp 97.8 F (36.6 C) (Oral)   Resp 20   Ht 5\' 5"  (1.651 m)   Wt 84.5 kg   SpO2 91%   BMI 31.00 kg/m   Physical Exam Vitals reviewed.  HENT:     Head:     Comments: Abrasion/skin tear on left parietal area.  Also left anterior lateral scalp with abrasion/hematoma.  Progresses down to left eyebrow.    Ears:     Comments: Some cerumen bilaterally, but TMs visualized.    Mouth/Throat:     Mouth: Mucous membranes are moist.  Eyes:     Pupils: Pupils are equal, round, and reactive to light.  Cardiovascular:     Rate and Rhythm: Regular rhythm.  Pulmonary:      Breath sounds: No wheezing or rhonchi.  Musculoskeletal:     Cervical back: Neck supple.     Comments: Skin tear on left elbow without underlying bony tenderness.  Good range of motion.  No tenderness over shoulder or wrist.  Mild ecchymosis on hand without underlying tenderness.  Some tenderness on left great toe.  No tenderness over hips knees or either ankle.  Skin:    Capillary Refill: Capillary refill takes less than 2 seconds.  Neurological:  Mental Status: He is alert. Mental status is at baseline.     Comments: Awake and pleasant but some mild baseline confusion per patient's sister.     ED Results / Procedures / Treatments   Labs (all labs ordered are listed, but only abnormal results are displayed) Labs Reviewed  CBC WITH DIFFERENTIAL/PLATELET - Abnormal; Notable for the following components:      Result Value   RBC 4.14 (*)    Hemoglobin 12.1 (*)    HCT 37.8 (*)    Platelets 141 (*)    All other components within normal limits  COMPREHENSIVE METABOLIC PANEL - Abnormal; Notable for the following components:   CO2 21 (*)    Glucose, Bld 129 (*)    BUN 45 (*)    Creatinine, Ser 1.46 (*)    Calcium 8.8 (*)    Total Protein 6.4 (*)    GFR, Estimated 48 (*)    All other components within normal limits  POC OCCULT BLOOD, ED  TROPONIN I (HIGH SENSITIVITY)  TROPONIN I (HIGH SENSITIVITY)    EKG EKG Interpretation  Date/Time:  Thursday Dec 03 2020 08:26:32 EDT Ventricular Rate:  62 PR Interval:  185 QRS Duration: 99 QT Interval:  388 QTC Calculation: 394 R Axis:   62 Text Interpretation: Sinus rhythm Low voltage, precordial leads Confirmed by Davonna Belling [phone]) on 12/03/2020 8:42:16 AM   Radiology CT Head Wo Contrast  Result Date: 12/03/2020 CLINICAL DATA:  Recent fall with headaches, initial encounter EXAM: CT HEAD WITHOUT CONTRAST TECHNIQUE: Contiguous axial images were obtained from the base of the skull through the vertex without intravenous contrast.  COMPARISON:  09/12/2018 FINDINGS: Brain: No evidence of acute infarction, hemorrhage, hydrocephalus, extra-axial collection or mass lesion/mass effect. Mild atrophic changes are noted. Vascular: No hyperdense vessel or unexpected calcification. Skull: Normal. Negative for fracture or focal lesion. Sinuses/Orbits: No acute finding. Other: None. IMPRESSION: Mild atrophic changes without acute abnormality. Electronically Signed   By: Inez Catalina M.D.   On: 12/03/2020 09:55   DG Chest Portable 1 View  Result Date: 12/03/2020 CLINICAL DATA:  Syncope EXAM: PORTABLE CHEST 1 VIEW COMPARISON:  None. FINDINGS: There is cardiomegaly with pulmonary vascularity within normal limits. There is interstitial thickening without overt edema or consolidation. No adenopathy appreciable. No bone lesions. There is calcification in each carotid artery. IMPRESSION: Cardiomegaly. Interstitium appears rather thickened which may indicate a degree of underlying bronchitis. No frank edema or consolidation. Calcification noted in each carotid artery. Electronically Signed   By: Lowella Grip III M.D.   On: 12/03/2020 09:12   DG Toe Great Left  Result Date: 12/03/2020 CLINICAL DATA:  Fall with first toe pain, initial encounter EXAM: LEFT GREAT TOE COMPARISON:  None. FINDINGS: There is no evidence of fracture or dislocation. There is no evidence of arthropathy or other focal bone abnormality. Soft tissues are unremarkable. IMPRESSION: No acute abnormality noted. Electronically Signed   By: Inez Catalina M.D.   On: 12/03/2020 10:01    Procedures Procedures   Medications Ordered in ED Medications  sodium chloride 0.9 % bolus 500 mL (500 mLs Intravenous New Bag/Given 12/03/20 1054)    ED Course  I have reviewed the triage vital signs and the nursing notes.  Pertinent labs & imaging results that were available during my care of the patient were reviewed by me and considered in my medical decision making (see chart for  details).    MDM Rules/Calculators/A&P  Patient presents after fall.  Reportedly was feeling lightheaded.  Not on blood thinners.  Hit head.  Head CT reassuring.  No laceration large on for suturing.  Abrasion to elbow without underlying bony tenderness.  X-ray of toe since there is tenderness and that did not show fracture.  Has a mild anemia.  Guaiac negative.  Small fluid bolus given.  Mildly decreased blood pressure, however patient able to ambulate.  Patient is actually eager to go home.  Discussed about possible admission to the hospital but would rather go home with an outpatient work-up.  Will discharge home.  Has had a history of vertigo.  On Antivert for it. Final Clinical Impression(s) / ED Diagnoses Final diagnoses:  Fall, initial encounter  Minor head injury, initial encounter  Anemia, unspecified type    Rx / DC Orders ED Discharge Orders    None       Davonna Belling, MD 12/03/20 1427

## 2020-12-03 NOTE — Discharge Instructions (Signed)
You have a mild anemia.  Follow-up with your doctor for it.  There was no blood in your stool.

## 2020-12-03 NOTE — ED Notes (Signed)
Pt stating that he is ready to go and pulled out IV and off monitoring Pt sister called to come back to pick up pt. Sister asked that staff wait to provide D/C instructions until she returns

## 2020-12-04 ENCOUNTER — Encounter: Payer: Self-pay | Admitting: Family

## 2020-12-04 ENCOUNTER — Ambulatory Visit (INDEPENDENT_AMBULATORY_CARE_PROVIDER_SITE_OTHER): Payer: Medicare Other | Admitting: Family

## 2020-12-04 VITALS — BP 100/62 | HR 74 | Temp 98.2°F | Resp 16 | Ht 65.0 in | Wt 183.8 lb

## 2020-12-04 DIAGNOSIS — S0101XD Laceration without foreign body of scalp, subsequent encounter: Secondary | ICD-10-CM

## 2020-12-04 DIAGNOSIS — R42 Dizziness and giddiness: Secondary | ICD-10-CM | POA: Diagnosis not present

## 2020-12-04 DIAGNOSIS — W19XXXD Unspecified fall, subsequent encounter: Secondary | ICD-10-CM | POA: Diagnosis not present

## 2020-12-04 DIAGNOSIS — Y92009 Unspecified place in unspecified non-institutional (private) residence as the place of occurrence of the external cause: Secondary | ICD-10-CM

## 2020-12-04 DIAGNOSIS — D62 Acute posthemorrhagic anemia: Secondary | ICD-10-CM | POA: Diagnosis not present

## 2020-12-04 NOTE — Progress Notes (Signed)
Provider: Marlowe Sax FNP-C  Anguel Delapena, Nelda Bucks, NP  Patient Care Team: Sandrea Hughs, NP as PCP - General (Family Medicine)  Extended Emergency Contact Information Primary Emergency Contact: Debeer,Patricia Address: [address]          [place], [place] [address] [place] Phone: [phone] Relation: Sister  Code Status: Full Code  Goals of care: Advanced Directive information Advanced Directives 12/04/2020  Does Patient Have a Medical Advance Directive? No  Type of Advance Directive -  Does patient want to make changes to medical advance directive? -  Copy of [place] in Chart? -  Would patient like information on creating a medical advance directive? No - Patient declined     Chief Complaint  Patient presents with  . Hospitalization Follow-up    ER Follow Up for fall/ gash on head.  . Concern     HIGH FALL RISK    HPI:  Pt is a 80 y.o. male seen today for an acute visit for follow up ED visit 12/03/2020. He is here with sister.States was found on the Bathroom floor. He did loss consciousness.sister states he hit head on the toilet seat and broke the seat. He states had a spell of dizziness. Has had no more dizziness since then.He denies any fever,chills or cough.  He sustained small laceration on left top of the head and forehead which required no repair.He still has some dry blood on the head.  His lab worked in ED was unremarkable except for slight low hemoglobin 12.1 previous was 13.5 .CR was 1.46 slightly higher than his baseline. Had CT scan of the head which showed no acute infarction,hemorrhage,hdrocephalus,mass or lesion except had mild atrophic changes. His chest X-ray was negative for acute abnormalities.Had cardiomegaly.Also had EKG which showed Normal sinus Rhythm.No orthostasis noted.Glucose level was 129 before eating breakfast.  Sister states has sporadic dizziness every once in a while. Medication reviewed and  reconciled.    Past Medical History:  Diagnosis Date  . Arthritis   . Chronic kidney disease    CKD- Stage  II  . Colon cancer ([place])    2014  . Colon polyps    adenomatous  . Diabetes mellitus without complication ([place])   . Eczema   . Hypercholesteremia   . Hypertension    Past Surgical History:  Procedure Laterality Date  . COLON SURGERY  2014 & 2015   resection of colon & removal of poylp ; Dr Kathleene Hazel, Dr Sherlene Shams  . COLONOSCOPY  09/29/2013  . COLONOSCOPY WITH PROPOFOL N/A 03/10/2015   Procedure: COLONOSCOPY WITH PROPOFOL;  Surgeon: Inda Castle, MD;  Location: [facility];  Service: Endoscopy;  Laterality: N/A;  . EXTERNAL EAR SURGERY  1954  . FLEXIBLE SIGMOIDOSCOPY N/A 07/21/2014   Procedure: FLEXIBLE SIGMOIDOSCOPY;  Surgeon: Inda Castle, MD;  Location: [facility];  Service: Endoscopy;  Laterality: N/A;    No Known Allergies  Outpatient Encounter Medications as of 12/04/2020  Medication Sig  . clotrimazole-betamethasone (LOTRISONE) cream Apply 1 application topically 2 (two) times daily.  . diclofenac sodium (VOLTAREN) 1 % GEL Apply 4 g topically 4 (four) times daily. To lower back  . docusate sodium (COLACE) 100 MG capsule Take 100 mg by mouth daily.  . finasteride (PROSCAR) 5 MG tablet TAKE ONE TABLET BY MOUTH DAILY  . glipiZIDE (GLUCOTROL XL) 5 MG 24 hr tablet TAKE ONE TABLET BY MOUTH EVERY MORNING WITH BREAKFAST  . glucose blood (ONETOUCH ULTRA) test strip USE  TO CHECK BLOOD SUGARS ONCE DAILY AS DIRECTED FOR DIABETES  . Lancets (ONETOUCH DELICA PLUS [id number]) MISC USE UP TO 4 TIMES A DAY AS DIRECTED  . lisinopril (ZESTRIL) 5 MG tablet TAKE ONE TABLET BY MOUTH DAILY  . metFORMIN (GLUCOPHAGE) 1000 MG tablet Take 1 tablet (1,000 mg total) by mouth daily with breakfast.  . rosuvastatin (CRESTOR) 20 MG tablet Take 1 tablet (20 mg total) by mouth daily.  . sitaGLIPtin (JANUVIA) 100 MG tablet Take 1 tablet (100 mg total) by mouth daily.  . tamsulosin (FLOMAX) 0.4  MG CAPS capsule Take 1 capsule (0.4 mg total) by mouth daily.   No facility-administered encounter medications on file as of 12/04/2020.    Review of Systems  Constitutional: Negative for appetite change, chills, fatigue, fever and unexpected weight change.  HENT: Positive for hearing loss. Negative for congestion, dental problem, ear discharge, ear pain, facial swelling, nosebleeds, postnasal drip, rhinorrhea, sinus pressure, sinus pain, sneezing, sore throat, tinnitus and trouble swallowing.   Eyes: Positive for visual disturbance. Negative for pain, discharge, redness and itching.       Wears sun glasses   Respiratory: Negative for cough, chest tightness, shortness of breath and wheezing.   Cardiovascular: Negative for chest pain, palpitations and leg swelling.  Gastrointestinal: Negative for abdominal distention, abdominal pain, blood in stool, constipation, diarrhea, nausea and vomiting.  Endocrine: Negative for cold intolerance, heat intolerance, polydipsia, polyphagia and polyuria.  Genitourinary: Negative for difficulty urinating, dysuria, flank pain, frequency and urgency.  Musculoskeletal: Negative for arthralgias, back pain, gait problem, joint swelling, myalgias, neck pain and neck stiffness.  Skin: Negative for color change, pallor, rash and wound.       Laceration scab on left top of head and forehead.   Neurological: Negative for syncope, speech difficulty, weakness, light-headedness, numbness and headaches.       Dizziness per HPI   Hematological: Does not bruise/bleed easily.  Psychiatric/Behavioral: Negative for agitation, behavioral problems, confusion, hallucinations, self-injury, sleep disturbance and suicidal ideas. The patient is not nervous/anxious.     Immunization History  Administered Date(s) Administered  . Fluad Quad(high Dose 65+) 03/25/2019, 05/14/2020  . Influenza, High Dose Seasonal PF 04/27/2017, 04/30/2018  . Influenza,inj,Quad PF,6+ Mos 05/14/2015,  06/03/2016  . Influenza-Unspecified 05/26/2011, 08/15/2012, 05/01/2013  . PFIZER Comirnaty(Gray Top)Covid-19 Tri-Sucrose Vaccine 08/22/2019, 09/12/2019, 05/19/2020  . Pneumococcal Conjugate-13 08/13/2015  . Pneumococcal Polysaccharide-23 03/12/2009  . Tdap 02/07/2013  . Zoster 01/03/2016   Pertinent  Health Maintenance Due  Topic Date Due  . FOOT EXAM  10/14/2020  . OPHTHALMOLOGY EXAM  01/22/2021  . INFLUENZA VACCINE  03/01/2021  . HEMOGLOBIN A1C  03/28/2021  . PNA vac Low Risk Adult  Completed   Fall Risk  12/04/2020 09/02/2020 07/07/2020 05/29/2020 05/14/2020  Falls in the past year? 1 0 1 0 0  Number falls in past yr: 1 - 0 0 0  Comment - - - - -  Injury with Fall? 1 - 1 0 0  Comment - - - - -   Functional Status Survey:    Vitals:   12/04/20 1538  BP: 100/62  Pulse: 74  Resp: 16  Temp: 98.2 F (36.8 C)  SpO2: 97%  Weight: 183 lb 12.8 oz (83.4 kg)  Height: '5\' 5"'  (1.651 m)   Body mass index is 30.59 kg/m. Physical Exam Vitals reviewed.  Constitutional:      General: He is not in acute distress.    Appearance: Normal appearance. He is normal weight. He is not  ill-appearing or diaphoretic.  HENT:     Head: Normocephalic.     Mouth/Throat:     Mouth: Mucous membranes are moist.     Pharynx: Oropharynx is clear. No oropharyngeal exudate or posterior oropharyngeal erythema.  Eyes:     General: No scleral icterus.       Right eye: No discharge.        Left eye: No discharge.     Extraocular Movements: Extraocular movements intact.     Conjunctiva/sclera: Conjunctivae normal.     Pupils: Pupils are equal, round, and reactive to light.  Neck:     Vascular: No carotid bruit.  Cardiovascular:     Rate and Rhythm: Normal rate and regular rhythm.     Pulses: Normal pulses.     Heart sounds: Normal heart sounds. No murmur heard. No friction rub. No gallop.   Pulmonary:     Effort: Pulmonary effort is normal. No respiratory distress.     Breath sounds: Normal breath  sounds. No wheezing, rhonchi or rales.  Chest:     Chest wall: No tenderness.  Abdominal:     General: Bowel sounds are normal. There is no distension.     Palpations: Abdomen is soft. There is no mass.     Tenderness: There is no abdominal tenderness. There is no right CVA tenderness, left CVA tenderness, guarding or rebound.  Musculoskeletal:        General: No swelling or tenderness.     Cervical back: Normal range of motion. No rigidity or tenderness.     Right lower leg: No edema.     Left lower leg: No edema.     Comments: Unsteady gait ambulates with a cane   Lymphadenopathy:     Cervical: No cervical adenopathy.  Skin:    General: Skin is warm and dry.     Coloration: Skin is not pale.     Findings: No bruising, erythema, lesion or rash.  Neurological:     Mental Status: He is alert. Mental status is at baseline.     Cranial Nerves: No cranial nerve deficit.     Motor: No weakness.     Coordination: Coordination normal.     Gait: Gait normal.     Comments: HOH  Psychiatric:        Mood and Affect: Mood normal.        Speech: Speech normal.        Behavior: Behavior normal.        Thought Content: Thought content normal.        Judgment: Judgment normal.     Labs reviewed: Recent Labs    05/11/20 0806 09/28/20 1233 12/03/20 0833  NA 139 138 135  K 5.0 4.8 5.1  CL 105 105 108  CO2 27 23 21*  GLUCOSE 91 48* 129*  BUN 42* 54* 45*  CREATININE 1.49* 1.36* 1.46*  CALCIUM 9.5 9.3 8.8*   Recent Labs    12/03/20 0833  AST 23  ALT 19  ALKPHOS 61  BILITOT 0.8  PROT 6.4*  ALBUMIN 3.6   Recent Labs    09/28/20 1233 12/03/20 0833  WBC 9.7 7.5  NEUTROABS 6,625 5.8  HGB 13.5 12.1*  HCT 39.9 37.8*  MCV 88.3 91.3  PLT 192 141*   No results found for: TSH Lab Results  Component Value Date   HGBA1C 5.9 (H) 09/28/2020   Lab Results  Component Value Date   CHOL 225 (H) 05/11/2020   HDL  46 05/11/2020   LDLCALC 150 (H) 05/11/2020   TRIG 156 (H)  05/11/2020   CHOLHDL 4.9 05/11/2020    Significant Diagnostic Results in last 30 days:  CT Head Wo Contrast  Result Date: 12/03/2020 CLINICAL DATA:  Recent fall with headaches, initial encounter EXAM: CT HEAD WITHOUT CONTRAST TECHNIQUE: Contiguous axial images were obtained from the base of the skull through the vertex without intravenous contrast. COMPARISON:  09/12/2018 FINDINGS: Brain: No evidence of acute infarction, hemorrhage, hydrocephalus, extra-axial collection or mass lesion/mass effect. Mild atrophic changes are noted. Vascular: No hyperdense vessel or unexpected calcification. Skull: Normal. Negative for fracture or focal lesion. Sinuses/Orbits: No acute finding. Other: None. IMPRESSION: Mild atrophic changes without acute abnormality. Electronically Signed   By: Inez Catalina M.D.   On: 12/03/2020 09:55   DG Chest Portable 1 View  Result Date: 12/03/2020 CLINICAL DATA:  Syncope EXAM: PORTABLE CHEST 1 VIEW COMPARISON:  None. FINDINGS: There is cardiomegaly with pulmonary vascularity within normal limits. There is interstitial thickening without overt edema or consolidation. No adenopathy appreciable. No bone lesions. There is calcification in each carotid artery. IMPRESSION: Cardiomegaly. Interstitium appears rather thickened which may indicate a degree of underlying bronchitis. No frank edema or consolidation. Calcification noted in each carotid artery. Electronically Signed   By: Lowella Grip III M.D.   On: 12/03/2020 09:12   DG Toe Great Left  Result Date: 12/03/2020 CLINICAL DATA:  Fall with first toe pain, initial encounter EXAM: LEFT GREAT TOE COMPARISON:  None. FINDINGS: There is no evidence of fracture or dislocation. There is no evidence of arthropathy or other focal bone abnormality. Soft tissues are unremarkable. IMPRESSION: No acute abnormality noted. Electronically Signed   By: Inez Catalina M.D.   On: 12/03/2020 10:01    Assessment/Plan  Dizziness Unclear  etiology.status post ED visit post fall episode.No dizziness since ED visit.  CT head,CXR,labs and EKG  are all normal.Discussed option for further evaluation with Neuro.Both in agreement with plan.  - Discussed to stop driving to prevent injury to self and others until evaluated by Neurologist. - CBC with Differential/Platelet - BMP with eGFR([facility]) - Ambulatory referral to Neurology  2. Anemia due to acute blood loss Hgb 12.1 suspect due to blood loss from fall. - CBC with Differential/Platelet  3. Laceration of scalp without foreign body, subsequent encounter Dry blood on left scalp and left forehead - No signs of infection - Advised to cleanse laceration site with warm water and pat dry with wash cloth.  - Notify provider for any signs of infection   4. Fall at home, subsequent encounter Possible due to dizziness as above status post ED visit for evaluation.Walks with a cane but tends to carry around instead of using it.  - will consider PT    Family/ staff Communication: Reviewed plan of care with patient and sister verbalized understanding.   Labs/tests ordered:  - CBC with Differential/Platelet - BMP with eGFR([facility])  Next Appointment: 4 months for medical management of chronic issues.  Sandrea Hughs, NP

## 2020-12-04 NOTE — Patient Instructions (Addendum)
- blood work done today will call you with results. - Referral ordered for Neurologist specialist office will call you for appointment   Dizziness Dizziness is a common problem. It makes you feel unsteady or light-headed. You may feel like you are about to pass out (faint). Dizziness can lead to getting hurt if you stumble or fall. Dizziness can be caused by many things, including:  Medicines.  Not having enough water in your body (dehydration).  Illness. Follow these instructions at home: Eating and drinking  Drink enough fluid to keep your pee (urine) clear or pale yellow. This helps to keep you from getting dehydrated. Try to drink more clear fluids, such as water.  Do not drink alcohol.  Limit how much caffeine you drink or eat, if your doctor tells you to do that.  Limit how much salt (sodium) you drink or eat, if your doctor tells you to do that.   Activity  Avoid making quick movements. ? When you stand up from sitting in a chair, steady yourself until you feel okay. ? In the morning, first sit up on the side of the bed. When you feel okay, stand slowly while you hold onto something. Do this until you know that your balance is fine.  If you need to stand in one place for a long time, move your legs often. Tighten and relax the muscles in your legs while you are standing.  Do not drive or use heavy machinery if you feel dizzy.  Avoid bending down if you feel dizzy. Place items in your home so you can reach them easily without leaning over.   Lifestyle  Do not use any products that contain nicotine or tobacco, such as cigarettes and e-cigarettes. If you need help quitting, ask your doctor.  Try to lower your stress level. You can do this by using methods such as yoga or meditation. Talk with your doctor if you need help. General instructions  Watch your dizziness for any changes.  Take over-the-counter and prescription medicines only as told by your doctor. Talk with  your doctor if you think that you are dizzy because of a medicine that you are taking.  Tell a friend or a family member that you are feeling dizzy. If he or she notices any changes in your behavior, have this person call your doctor.  Keep all follow-up visits as told by your doctor. This is important. Contact a doctor if:  Your dizziness does not go away.  Your dizziness or light-headedness gets worse.  You feel sick to your stomach (nauseous).  You have trouble hearing.  You have new symptoms.  You are unsteady on your feet.  You feel like the room is spinning. Get help right away if:  You throw up (vomit) or have watery poop (diarrhea), and you cannot eat or drink anything.  You have trouble: ? Talking. ? Walking. ? Swallowing. ? Using your arms, hands, or legs.  You feel generally weak.  You are not thinking clearly, or you have trouble forming sentences. A friend or family member may notice this.  You have: ? Chest pain. ? Pain in your belly (abdomen). ? Shortness of breath. ? Sweating.  Your vision changes.  You are bleeding.  You have a very bad headache.  You have neck pain or a stiff neck.  You have a fever. These symptoms may be an emergency. Do not wait to see if the symptoms will go away. Get medical help right away.  Call your local emergency services (911 in the [place] Do not drive yourself to the hospital. Summary  Dizziness makes you feel unsteady or light-headed. You may feel like you are about to pass out (faint).  Drink enough fluid to keep your pee (urine) clear or pale yellow. Do not drink alcohol.  Avoid making quick movements if you feel dizzy.  Watch your dizziness for any changes. This information is not intended to replace advice given to you by your health care provider. Make sure you discuss any questions you have with your health care provider. Document Revised: 04/08/2020 Document Reviewed: 08/04/2016 Elsevier Patient  Education  [place].

## 2020-12-05 LAB — CBC WITH DIFFERENTIAL/PLATELET
Absolute Monocytes: 1036 cells/uL — ABNORMAL HIGH (ref 200–950)
Basophils Absolute: 38 cells/uL (ref 0–200)
Basophils Relative: 0.4 %
Eosinophils Absolute: 143 cells/uL (ref 15–500)
Eosinophils Relative: 1.5 %
HCT: 39.3 % (ref 38.5–50.0)
Hemoglobin: 12.9 g/dL — ABNORMAL LOW (ref 13.2–17.1)
Lymphs Abs: 2166 cells/uL (ref 850–3900)
MCH: 28.7 pg (ref 27.0–33.0)
MCHC: 32.8 g/dL (ref 32.0–36.0)
MCV: 87.5 fL (ref 80.0–100.0)
MPV: 10.3 fL (ref 7.5–12.5)
Monocytes Relative: 10.9 %
Neutro Abs: 6118 cells/uL (ref 1500–7800)
Neutrophils Relative %: 64.4 %
Platelets: 171 10*3/uL (ref 140–400)
RBC: 4.49 10*6/uL (ref 4.20–5.80)
RDW: 13.1 % (ref 11.0–15.0)
Total Lymphocyte: 22.8 %
WBC: 9.5 10*3/uL (ref 3.8–10.8)

## 2020-12-05 LAB — BASIC METABOLIC PANEL WITH GFR
BUN/Creatinine Ratio: 27 (calc) — ABNORMAL HIGH (ref 6–22)
BUN: 46 mg/dL — ABNORMAL HIGH (ref 7–25)
CO2: 22 mmol/L (ref 20–32)
Calcium: 9.1 mg/dL (ref 8.6–10.3)
Chloride: 108 mmol/L (ref 98–110)
Creat: 1.71 mg/dL — ABNORMAL HIGH (ref 0.70–1.11)
GFR, Est African American: 43 mL/min/{1.73_m2} — ABNORMAL LOW (ref >=60)
GFR, Est Non African American: 37 mL/min/{1.73_m2} — ABNORMAL LOW (ref >=60)
Glucose, Bld: 81 mg/dL (ref 65–139)
Potassium: 4.7 mmol/L (ref 3.5–5.3)
Sodium: 139 mmol/L (ref 135–146)

## 2020-12-07 ENCOUNTER — Encounter: Payer: Self-pay | Admitting: Neurology

## 2020-12-18 ENCOUNTER — Ambulatory Visit: Payer: Self-pay | Admitting: Adult Health

## 2020-12-22 ENCOUNTER — Telehealth: Payer: Self-pay | Admitting: *Deleted

## 2020-12-22 NOTE — Telephone Encounter (Signed)
Spoke with patient's, Brother in [place], and he confirmed change in PCP to Dr. Mariea Clonts.  Patient is aware that future medication refills and appointments will go through Dr. Mariea Clonts.  Canceled upcoming appointment with [place].

## 2020-12-24 ENCOUNTER — Telehealth: Payer: Self-pay

## 2020-12-24 NOTE — Telephone Encounter (Signed)
Spoke with POA Maricela Bo and she wishes to have Dr. Pablo Ledger continue as patient's PCP. Consent sent. Patient scheduled for 12/29/20 at 11:30

## 2020-12-29 ENCOUNTER — Other Ambulatory Visit: Payer: Self-pay

## 2020-12-29 ENCOUNTER — Encounter: Payer: Self-pay | Admitting: Internal Medicine

## 2020-12-29 ENCOUNTER — Other Ambulatory Visit: Payer: Medicare Other | Admitting: Internal Medicine

## 2020-12-29 VITALS — BP 122/72 | HR 74 | Resp 16

## 2020-12-29 DIAGNOSIS — H903 Sensorineural hearing loss, bilateral: Secondary | ICD-10-CM

## 2020-12-29 DIAGNOSIS — R4189 Other symptoms and signs involving cognitive functions and awareness: Secondary | ICD-10-CM

## 2020-12-29 DIAGNOSIS — N182 Chronic kidney disease, stage 2 (mild): Secondary | ICD-10-CM

## 2020-12-29 DIAGNOSIS — R42 Dizziness and giddiness: Secondary | ICD-10-CM

## 2020-12-29 DIAGNOSIS — E1122 Type 2 diabetes mellitus with diabetic chronic kidney disease: Secondary | ICD-10-CM

## 2020-12-29 DIAGNOSIS — K5904 Chronic idiopathic constipation: Secondary | ICD-10-CM

## 2020-12-29 DIAGNOSIS — R55 Syncope and collapse: Secondary | ICD-10-CM

## 2020-12-29 MED ORDER — SITAGLIPTIN PHOSPHATE 100 MG PO TABS: 100.0000 mg | ORAL_TABLET | Freq: Every day | ORAL | 3 refills | Status: DC

## 2020-12-29 NOTE — Patient Instructions (Addendum)
I recommend the shingrix vaccine series for Justin [name] which is done at the pharmacy to help prevent the shingles.    I also understand he had the 4th covid vaccine.  If you can send me the date for that in [place], that will be great.    [facility] will be in touch with a day labs can be drawn at home, the list of aides and agencies, and a Education officer, museum will reach out about resources, too.    We'll plan to meet again near the end of August.

## 2020-12-29 NOTE — Progress Notes (Signed)
[facility] ACUTE Visit Telephone: [phone]  Fax: [phone]  Location:  Home Provider:  Claudio Mondry L. Mariea Clonts, D.O., C.M.D.  Advance Directives:  Advanced Directives 12/04/2020  Does Patient Have a Medical Advance Directive? No  Type of Advance Directive -  Does patient want to make changes to medical advance directive? -  Copy of [place] in Chart? -  Would patient like information on creating a medical advance directive? No - Patient declined    Chief Complaint  Patient presents with  . Medical Management of Chronic Issues  . Acute Visit    Recent syncope, ongoing dizzy spells    HPI: Patient is a 80 y.o. male seen today at home for an acute visit due to syncope wiith head trauma 12/03/20 and to address chronic medical conditions.  This resulted in a visit to the emergency department with testing performed including an EKG, troponins, basic labs, hemoccult, CT of this head and left great toe xrays due to pain reported.  He had been feeling dizzy when he slipped getting out of the shower.  He'd taken an antivert prior to his fall.  He had no other symptoms.  He did sustain a laceration to his right eyebrow and had hematoma of his left hand and head with skin tear to head.  He was mentally at baseline per ED notes.    His family had reached out to me with concerns about a need for increased monitoring at home and I suggested home health referral and in home caregivers part time with plans to discuss further at today's formal visit.    When seen today, he was sitting in his recliner in the basement of his sister and brother-in-law's home.  He has his own space and entry down there where he does not require stairs.  He is to call them for assistance.    He admits to ongoing dizziness which he's been telling [name] is primarily in the mornings, but he did not say that today.  He notes it's an issue when he's walking and he's off  balance.  He denies the room spinning and also denies lightheadedness on standing or otherwise.  He continues to struggle with hydration though his sister has gotten him a water bottle that 2 are recommended per day--he struggles to drink one.  He does drink 2-3 glasses of sugar-free hawaiian punch and soda each day with his meals.  His sister has convinced him to eat some veggies and he prefers cauliflower and peas.   He eats sandwiches at lunch with soup and she's aware of the high sodium content in these foods and has educated him to eat the brown not white breads.  He will eat the meals prepared at the evening meal.  His blood sugar this morning was 152.  They had keto enchiladas last night.  He's not very active.    [place] does now have SCAT privileges in view of his syncope and inability to drive.  We reviewed that DMV has rules about no driving for at least 6 months when one blacks out and even longer if the problem causing dizziness does not resolve itself.    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 He notes he sees spots in his vision.  His sister intends for him to follow up with an ophthalmologist and I agree  this is needed to ensure he's not having retinal concerns or something else that could affect his balance though unlikely the sole cause.  Joseff had his toenails trimmed by podiatry and denies any concerns in his feet today.  He misses fishing which he did more often before moving in with his family.  Reviewing his meds, I learned he was not taking [place] but was taking glipizide 5mg  each morning.  I discussed the risks of hypoglycemia which could cause blacking out and we opted to stop it.  They agree to restart [place] if cost is not prohibitive.  He is now taking the crestor as directed for cholesterol.    His blood pressure was at goal.    Upon discussion with  [name], I learned that they've applied for medicaid for him, but she's worried he will not be eligible.  We discussed need for temporary home health for home safety and physical therapy.  We also talked about [facility] and Elmyra Ricks visited them already and they determined he was not eligible for any of the [facility] programs (no dementia diagnosis).  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  I suggested this be discussed with a Education officer, museum that would be more familiar with services for that specific group.  We also discussed the need for some caregiver hours though only limited and they did request a list of individual especially and the agencies to see if they can find someone who would get along well with [name] and help with his routine.   No Known Allergies  Outpatient Encounter Medications as of 12/29/2020  Medication Sig  . clotrimazole-betamethasone (LOTRISONE) cream Apply 1 application topically 2 (two) times daily.  . diclofenac sodium (VOLTAREN) 1 % GEL Apply 4 g topically 4 (four) times daily. To lower back  . docusate sodium (COLACE) 100 MG capsule Take 100 mg by mouth daily.  . finasteride (PROSCAR) 5 MG tablet TAKE ONE TABLET BY MOUTH DAILY  . glipiZIDE (GLUCOTROL XL) 5 MG 24 hr tablet TAKE ONE TABLET BY MOUTH EVERY MORNING WITH BREAKFAST  . glucose blood (ONETOUCH ULTRA) test strip USE TO CHECK BLOOD SUGARS ONCE DAILY AS DIRECTED FOR DIABETES  . Lancets (ONETOUCH DELICA PLUS CBULAG53M) MISC USE UP TO 4 TIMES A DAY AS DIRECTED  . lisinopril (ZESTRIL) 5 MG tablet TAKE ONE TABLET BY MOUTH DAILY  . metFORMIN (GLUCOPHAGE) 1000 MG tablet Take 1 tablet (1,000 mg total) by mouth daily with breakfast.  . rosuvastatin (CRESTOR) 20 MG tablet Take 1 tablet (20 mg total) by mouth daily.  . sitaGLIPtin (JANUVIA) 100 MG tablet Take 1 tablet (100 mg total) by mouth daily.  . tamsulosin (FLOMAX) 0.4 MG CAPS capsule Take 1  capsule (0.4 mg total) by mouth daily.   No facility-administered encounter medications on file as of 12/29/2020.    Review of Systems:  Review of Systems  Constitutional: Negative for chills, fever and malaise/fatigue.  HENT: Positive for hearing loss. Negative for congestion and sore throat.   Eyes: Negative for blurred vision.       Spots in vision  Respiratory: Negative for cough and shortness of breath.   Cardiovascular: Negative for chest pain, palpitations and leg swelling.  Gastrointestinal: Negative for abdominal pain and blood in stool.  Genitourinary: Negative for dysuria.  Musculoskeletal: Positive for falls. Negative for back pain and joint pain.  Skin: Negative for rash.  Neurological: Positive for dizziness and loss of  consciousness. Negative for tingling, tremors, sensory change, speech change, focal weakness, seizures, weakness and headaches.  Psychiatric/Behavioral: Negative for depression. The patient is not nervous/anxious and does not have insomnia.        Some developmental delay--was high forceps baby per his sister      Physical Exam: Vitals:   12/29/20 1305  BP: 122/72  Pulse: 74  Resp: 16   There is no height or weight on file to calculate BMI. Physical Exam Vitals reviewed.  Constitutional:      General: He is not in acute distress.    Appearance: Normal appearance. He is not ill-appearing or toxic-appearing.  HENT:     Head: Normocephalic and atraumatic.  Eyes:     Extraocular Movements: Extraocular movements intact.  Cardiovascular:     Rate and Rhythm: Normal rate and regular rhythm.     Pulses: Normal pulses.     Heart sounds: Normal heart sounds.  Pulmonary:     Effort: Pulmonary effort is normal.     Breath sounds: Normal breath sounds. No wheezing, rhonchi or rales.  Musculoskeletal:        General: Normal range of motion.     Right lower leg: No edema.     Left lower leg: No edema.  Skin:    General: Skin is warm and dry.   Neurological:     General: No focal deficit present.     Mental Status: He is alert and oriented to person, place, and time.     Comments: Now using cane to ambulate  Psychiatric:     Comments: Got very agitated after the discussion about not driving     Labs reviewed: Basic Metabolic Panel: Recent Labs    09/28/20 1233 12/03/20 0833 12/04/20 1637  NA 138 135 139  K 4.8 5.1 4.7  CL 105 108 108  CO2 23 21* 22  GLUCOSE 48* 129* 81  BUN 54* 45* 46*  CREATININE 1.36* 1.46* 1.71*  CALCIUM 9.3 8.8* 9.1   Liver Function Tests: Recent Labs    12/03/20 0833  AST 23  ALT 19  ALKPHOS 61  BILITOT 0.8  PROT 6.4*  ALBUMIN 3.6   No results for input(s): LIPASE, AMYLASE in the last 8760 hours. No results for input(s): AMMONIA in the last 8760 hours. CBC: Recent Labs    09/28/20 1233 12/03/20 0833 12/04/20 1637  WBC 9.7 7.5 9.5  NEUTROABS 6,625 5.8 6,118  HGB 13.5 12.1* 12.9*  HCT 39.9 37.8* 39.3  MCV 88.3 91.3 87.5  PLT 192 141* 171   Lipid Panel: Recent Labs    05/11/20 0806  CHOL 225*  HDL 46  LDLCALC 150*  TRIG 156*  CHOLHDL 4.9   Lab Results  Component Value Date   HGBA1C 5.9 (H) 09/28/2020    Procedures since last visit: CT Head Wo Contrast  Result Date: 12/03/2020 CLINICAL DATA:  Recent fall with headaches, initial encounter EXAM: CT HEAD WITHOUT CONTRAST TECHNIQUE: Contiguous axial images were obtained from the base of the skull through the vertex without intravenous contrast. COMPARISON:  09/12/2018 FINDINGS: Brain: No evidence of acute infarction, hemorrhage, hydrocephalus, extra-axial collection or mass lesion/mass effect. Mild atrophic changes are noted. Vascular: No hyperdense vessel or unexpected calcification. Skull: Normal. Negative for fracture or focal lesion. Sinuses/Orbits: No acute finding. Other: None. IMPRESSION: Mild atrophic changes without acute abnormality. Electronically Signed   By: Inez Catalina M.D.   On: 12/03/2020 09:55   DG Chest  Portable 1 View  Result  Date: 12/03/2020 CLINICAL DATA:  Syncope EXAM: PORTABLE CHEST 1 VIEW COMPARISON:  None. FINDINGS: There is cardiomegaly with pulmonary vascularity within normal limits. There is interstitial thickening without overt edema or consolidation. No adenopathy appreciable. No bone lesions. There is calcification in each carotid artery. IMPRESSION: Cardiomegaly. Interstitium appears rather thickened which may indicate a degree of underlying bronchitis. No frank edema or consolidation. Calcification noted in each carotid artery. Electronically Signed   By: Lowella Grip III M.D.   On: 12/03/2020 09:12   DG Toe Great Left  Result Date: 12/03/2020 CLINICAL DATA:  Fall with first toe pain, initial encounter EXAM: LEFT GREAT TOE COMPARISON:  None. FINDINGS: There is no evidence of fracture or dislocation. There is no evidence of arthropathy or other focal bone abnormality. Soft tissues are unremarkable. IMPRESSION: No acute abnormality noted. Electronically Signed   By: Inez Catalina M.D.   On: 12/03/2020 10:01    Assessment/Plan 1. Syncope, unspecified syncope type -keep appt with neurology -may require mri/mra (if he can tolerate this) to evaluate for stenosis as a possible cause of dizziness -I highly suspect his poor fluid intake and using heater in the small bathroom contributed to the syncope -encouraged at least one full 24oz water per day in addition to his sugar-free hawaiian punch and diet soda (family was considering offering a dollar if he drank the water and having him pay them a dollar if he did not b/c he's highly motivated by money) -will also eliminate glipizide which may have caused hypoglycemia - Ambulatory referral to Home Health  2. Cognitive decline -he's always struggled to make good judgments and decisions (like moving out of the sun on a hot day, for ex) but was struggling more with meds and now having his dizziness challenges impacting his independence -refer  to home health for PT, OT home safety eval, nursing further education - Ambulatory referral to [facility] -family needs list of home care aids and agencies and social work guidance re: whether they should pursue an eval re: his learning disability at this point in his life (would it help with programs he could receive)  3. Dizziness -push fluids, avoid oral hypoglycemics, continue cane, home safety eval, keep neuro appt, no driving, avoid prolonged heat, follow up with ophthalmology--[facility] Katy [facility]o at [facility]  4. Sensorineural hearing loss (SNHL) of both ears -doing better today, did not have impacted cerumen  5. Type 2 diabetes mellitus with stage 2 chronic kidney disease, without long-term current use of insulin ([place]) -f/u hba1c  -stop glipizide -resume [place] which had been stopped at some point but was still on his list anyway  6. Chronic idiopathic constipation -cont colace  Labs/tests ordered:  Needs FLP, hba1c, bmp rechecked via home CMA blood draw  Next appt:  F/u in home again around or soon after 8/25   Daun Rens L. Vernon Prey, CMD [facility]  [address] [place], [place] [address] Office (8:30am-5pm):  [phone]) Mobile (8:30am-5pm):  [phone] On call (after 5pm):  [phone]

## 2020-12-31 ENCOUNTER — Telehealth: Payer: Self-pay

## 2020-12-31 NOTE — Telephone Encounter (Signed)
(  4:59pm) SW completed a follow-up call to patient's daughter-Patricia. Mardene Celeste thanked SW for the call, but stated she was not at home and could not talk. She advised SW that she will call her back tomorrow afternoon.

## 2020-12-31 NOTE — Telephone Encounter (Signed)
Phone call placed to patient's sister to schedule f/u visit with Dr. Mariea Clonts. VM left

## 2021-01-01 NOTE — Telephone Encounter (Signed)
VM left to schedule FU visit with Dr. Mariea Clonts

## 2021-01-04 NOTE — Progress Notes (Unsigned)
Late entry for 12/24/20 telephone note:  Patient request to continue with Dr Hollace Kinnier as community PCP, RN called to follow up on patient request

## 2021-01-05 DIAGNOSIS — M5137 Other intervertebral disc degeneration, lumbosacral region: Secondary | ICD-10-CM | POA: Diagnosis not present

## 2021-01-05 DIAGNOSIS — M5134 Other intervertebral disc degeneration, thoracic region: Secondary | ICD-10-CM | POA: Diagnosis not present

## 2021-01-05 DIAGNOSIS — M9902 Segmental and somatic dysfunction of thoracic region: Secondary | ICD-10-CM | POA: Diagnosis not present

## 2021-01-05 DIAGNOSIS — M9905 Segmental and somatic dysfunction of pelvic region: Secondary | ICD-10-CM | POA: Diagnosis not present

## 2021-01-05 DIAGNOSIS — M9903 Segmental and somatic dysfunction of lumbar region: Secondary | ICD-10-CM | POA: Diagnosis not present

## 2021-01-05 DIAGNOSIS — M47817 Spondylosis without myelopathy or radiculopathy, lumbosacral region: Secondary | ICD-10-CM | POA: Diagnosis not present

## 2021-01-10 NOTE — Progress Notes (Signed)
NEUROLOGY CONSULTATION NOTE  Justin [name] MRN: [id number] DOB: 1940-12-06  Referring provider: Marlowe Sax, NP Primary care provider: Marlowe Sax, NP  Reason for consult:  dizziness   Assessment/Plan:   Chronic daily dizziness Syncope   MRI/MRA of head without contrast to evaluate for vertebrobasilar insufficiency.  Will provide Valium for the procedure.  His sister will drive him. Check carotid ultrasound Check routine EEG Further recommendations pending results.   Subjective:  Justin [name] is an 80 year old right-handed male with HTN, HLD, type 2 diabetes mellitus, CKD, and history of colon cancer who presents for dizziness.  History supplemented by ED and referring provider's notes.  He is accompanied by his sister.  He has had dizziness for about a year that has been getting worse.  He has difficulty elaborating.  He has associated nausea.  It lasts an hour and has been occurring daily.  Has a mild headache once in awhile.  No history of migraines.  On 12/03/2020, he was getting into the shower when he suddenly felt dizzy, falling and hitting his head on the toilet seat and sustained a laceration.  His wife's husband heard him fall and immediately went to the bathroom.  He was conscious but moaning.  He reported that he did pass out.  He was brought to the ED.  CT head personally reviewed showed mild diffuse atrophy but no acute intracranial abnormalities.  EKG showed normal sinus rhythm.  Labs revealed slighly low hemoglobin of 12.1.  Guaiac was negative.  Glucose was 129.  Cr was 1.46, slightly higher than baseline   He does have bilateral sensorineural hearing loss.  PAST MEDICAL HISTORY: Past Medical History:  Diagnosis Date   Arthritis    Chronic kidney disease    CKD- Stage  II   Colon cancer ([place])    2014   Colon polyps    adenomatous   Diabetes mellitus without complication ([place])    Eczema    Hypercholesteremia    Hypertension     PAST SURGICAL  HISTORY: Past Surgical History:  Procedure Laterality Date   COLON SURGERY  2014 & 2015   resection of colon & removal of poylp ; Dr Kathleene Hazel, Dr Sherlene Shams   COLONOSCOPY  09/29/2013   COLONOSCOPY WITH PROPOFOL N/A 03/10/2015   Procedure: COLONOSCOPY WITH PROPOFOL;  Surgeon: Inda Castle, MD;  Location: [facility];  Service: Endoscopy;  Laterality: N/A;   [place] N/A 07/21/2014   Procedure: FLEXIBLE SIGMOIDOSCOPY;  Surgeon: Inda Castle, MD;  Location: [facility];  Service: Endoscopy;  Laterality: N/A;    MEDICATIONS: Current Outpatient Medications on File Prior to Visit  Medication Sig Dispense Refill   clotrimazole-betamethasone (LOTRISONE) cream Apply 1 application topically 2 (two) times daily. 45 g 3   diclofenac sodium (VOLTAREN) 1 % GEL Apply 4 g topically 4 (four) times daily. To lower back 150 g 3   docusate sodium (COLACE) 100 MG capsule Take 100 mg by mouth daily.     finasteride (PROSCAR) 5 MG tablet TAKE ONE TABLET BY MOUTH DAILY 90 tablet 1   glucose blood (ONETOUCH ULTRA) test strip USE TO CHECK BLOOD SUGARS ONCE DAILY AS DIRECTED FOR DIABETES 50 strip 10   Lancets (ONETOUCH DELICA PLUS [id number]) MISC USE UP TO 4 TIMES A DAY AS DIRECTED 100 each 2   lisinopril (ZESTRIL) 5 MG tablet TAKE ONE TABLET BY MOUTH DAILY 90 tablet 1   metFORMIN (GLUCOPHAGE) 1000 MG  tablet Take 1 tablet (1,000 mg total) by mouth daily with breakfast. 90 tablet 1   rosuvastatin (CRESTOR) 20 MG tablet Take 1 tablet (20 mg total) by mouth daily. 90 tablet 1   sitaGLIPtin (JANUVIA) 100 MG tablet Take 1 tablet (100 mg total) by mouth daily. 90 tablet 3   tamsulosin (FLOMAX) 0.4 MG CAPS capsule Take 1 capsule (0.4 mg total) by mouth daily. 90 capsule 1   No current facility-administered medications on file prior to visit.    ALLERGIES: No Known Allergies  FAMILY HISTORY: Family History  Problem Relation Age of Onset   Varicose Veins Mother     Stroke Mother    Glaucoma Mother    Kidney disease Mother    Hypertension Father    Heart attack Father    Cancer Brother        testicular cancer   Hyperlipidemia Sister    Meniere's disease Sister     Objective:  Blood pressure (!) 141/67, pulse 85, height 5\' 6"  (1.676 m), weight 187 lb 3.2 oz (84.9 kg), SpO2 93 %. General: No acute distress.  Patient appears well-groomed.   Head:  Normocephalic/atraumatic Eyes:  fundi examined but not visualized Neck: supple, no paraspinal tenderness, full range of motion Back: No paraspinal tenderness Heart: regular rate and rhythm Lungs: Clear to auscultation bilaterally. Vascular: No carotid bruits. Neurological Exam: Mental status: alert and oriented to person, place, and time, recent and remote memory intact, fund of knowledge intact, attention and concentration intact, speech fluent and not dysarthric, language intact. Cranial nerves: CN I: not tested CN II: pupils equal, round and reactive to light, visual fields intact CN III, IV, VI:  full range of motion, no nystagmus, no ptosis CN V: facial sensation intact. CN VII: upper and lower face symmetric CN VIII: hearing reduced bilaterally CN IX, X: gag intact, uvula midline CN XI: sternocleidomastoid and trapezius muscles intact CN XII: tongue midline Bulk & Tone: normal, no fasciculations. Motor:  muscle strength 5/5 throughout Sensation:  Pinprick sensation intact; vibratory sensation reduced in toes Deep Tendon Reflexes:  1+ throughout,  toes downgoing.   Finger to nose testing:  Without dysmetria.   Heel to shin:  Without dysmetria.   Gait:  Mildly unsteady.  Uses cane.  Romberg negative.  Thank you for allowing me to take part in the care of this patient.  Metta Clines, DO  CC: Marlowe Sax, NP

## 2021-01-11 ENCOUNTER — Ambulatory Visit (INDEPENDENT_AMBULATORY_CARE_PROVIDER_SITE_OTHER): Payer: Medicare Other | Admitting: Podiatry

## 2021-01-11 ENCOUNTER — Encounter: Payer: Self-pay | Admitting: Podiatry

## 2021-01-11 ENCOUNTER — Encounter: Payer: Self-pay | Admitting: Neurology

## 2021-01-11 ENCOUNTER — Ambulatory Visit: Payer: Medicare Other | Admitting: Neurology

## 2021-01-11 ENCOUNTER — Other Ambulatory Visit: Payer: Self-pay

## 2021-01-11 VITALS — BP 141/67 | HR 85 | Ht 66.0 in | Wt 187.2 lb

## 2021-01-11 DIAGNOSIS — H903 Sensorineural hearing loss, bilateral: Secondary | ICD-10-CM

## 2021-01-11 DIAGNOSIS — R55 Syncope and collapse: Secondary | ICD-10-CM

## 2021-01-11 DIAGNOSIS — G450 Vertebro-basilar artery syndrome: Secondary | ICD-10-CM | POA: Diagnosis not present

## 2021-01-11 DIAGNOSIS — M79674 Pain in right toe(s): Secondary | ICD-10-CM

## 2021-01-11 DIAGNOSIS — M79675 Pain in left toe(s): Secondary | ICD-10-CM

## 2021-01-11 DIAGNOSIS — R42 Dizziness and giddiness: Secondary | ICD-10-CM

## 2021-01-11 DIAGNOSIS — B351 Tinea unguium: Secondary | ICD-10-CM

## 2021-01-11 MED ORDER — DIAZEPAM 5 MG PO TABS: ORAL_TABLET | ORAL | 0 refills | Status: DC

## 2021-01-11 NOTE — Patient Instructions (Addendum)
Take diazepam 5mg  40-60 minutes prior to MRI.  May repeat at MRI facility if needed.  Must have a driver to and from appointment MRI and MRA of head Routine EEG Carotid ultrasound Further recommendations pending results.

## 2021-01-13 ENCOUNTER — Ambulatory Visit: Payer: Medicare Other | Admitting: Neurology

## 2021-01-13 ENCOUNTER — Other Ambulatory Visit: Payer: Self-pay

## 2021-01-13 DIAGNOSIS — R55 Syncope and collapse: Secondary | ICD-10-CM | POA: Diagnosis not present

## 2021-01-15 NOTE — Progress Notes (Signed)
LMOVM for pt to call us back.

## 2021-01-15 NOTE — Procedures (Signed)
ELECTROENCEPHALOGRAM REPORT  Date of Study: 01/13/2021  Patient's Name: Justin [name] MRN: [id number] Date of Birth: [birth year]   Clinical History: 80 year old male with dizziness, syncope and falls  Medications: LOTRISONE cream VOLTAREN 1 % GEL COLACE 100 MG capsule ZESTRIL 5 MG tablet GLUCOPHAGE 1000 MG tablet CRESTOR 20 MG tablet JANUVIA 100 MG tablet FLOMAX 0.4 MG CAPS   Technical Summary: A multichannel digital EEG recording measured by the international 10-20 system with electrodes applied with paste and impedances below 5000 ohms performed in our laboratory with EKG monitoring in an awake and drowsy patient.  Photic stimulation was performed.  The digital EEG was referentially recorded, reformatted, and digitally filtered in a variety of bipolar and referential montages for optimal display.    Description: The patient is awake and drowsy during the recording.  During maximal wakefulness, there is a symmetric, medium voltage 8 Hz posterior dominant rhythm that attenuates with eye opening.  The record is symmetric.  During drowsiness and sleep, there is an increase in theta slowing of the background.  Stage 2 sleep was not seen.  Photic stimulation did not elicit any abnormalities.  There were no epileptiform discharges or electrographic seizures seen.    EKG lead was unremarkable.  Impression: This awake and drowsy EEG is normal.    Clinical Correlation: A normal EEG does not exclude a clinical diagnosis of epilepsy.  If further clinical questions remain, prolonged EEG may be helpful.  Clinical correlation is advised.   Metta Clines, DO

## 2021-01-15 NOTE — Progress Notes (Signed)
  Subjective:  Patient ID: Justin [name], male    DOB: 1941/07/20,  MRN: [id number]  80 y.o. male presents with preventative diabetic foot care and painful thick toenails that are difficult to trim. Pain interferes with ambulation. Aggravating factors include wearing enclosed shoe gear. Pain is relieved with periodic professional debridement..    Patient's blood sugar was 198 mg/dl today.  PCP: Gayland Curry, DO and last visit was: 09/28/2020.  Patient states he had a fall in May and was hospitalized for a concussion.   Review of Systems: Negative except as noted in the HPI.   No Known Allergies  Objective:  There were no vitals filed for this visit. Constitutional Patient is a pleasant 80 y.o. Caucasian male in NAD. AAO x 3.  Vascular Capillary refill time to digits immediate b/l. Palpable pedal pulses b/l LE. Pedal hair present. Lower extremity skin temperature gradient within normal limits. No pain with calf compression b/l. No cyanosis or clubbing noted.  Neurologic Normal speech. Protective sensation intact 5/5 intact bilaterally with 10g monofilament b/l. Vibratory sensation intact b/l.  Dermatologic Pedal skin with normal turgor, texture and tone bilaterally. No open wounds bilaterally. No interdigital macerations bilaterally. Toenails 1-5 b/l elongated, discolored, dystrophic, thickened, crumbly with subungual debris and tenderness to dorsal palpation. No hyperkeratotic nor porokeratotic lesions present on today's visit.  Orthopedic: Normal muscle strength 5/5 to all lower extremity muscle groups bilaterally. No pain crepitus or joint limitation noted with ROM b/l. Hallux valgus with bunion deformity noted b/l lower extremities. Utilizes cane for ambulation assistance.   Hemoglobin A1C Latest Ref Rng & Units 09/28/2020 05/11/2020  HGBA1C <5.7 % of total Hgb 5.9(H) 5.9(H)  Some recent data might be hidden   Assessment:  No diagnosis found. Plan:  Patient was evaluated and  treated and all questions answered.  Onychomycosis with pain -Nails palliatively debridement as below. -Educated on self-care  Procedure: Nail Debridement Rationale: Pain Type of Debridement: manual, sharp debridement. Instrumentation: Nail nipper, rotary burr. Number of Nails: 10  -Examined patient. -Continue diabetic foot care principles. -Patient to continue soft, supportive shoe gear daily. -Toenails 1-5 b/l were debrided in length and girth with sterile nail nippers and dremel without iatrogenic bleeding.  -Patient to report any pedal injuries to medical professional immediately. -Patient/POA to call should there be question/concern in the interim.  Return in about 3 months (around 04/13/2021).  Marzetta Board, DPM

## 2021-01-20 DIAGNOSIS — M9902 Segmental and somatic dysfunction of thoracic region: Secondary | ICD-10-CM | POA: Diagnosis not present

## 2021-01-20 DIAGNOSIS — M5137 Other intervertebral disc degeneration, lumbosacral region: Secondary | ICD-10-CM | POA: Diagnosis not present

## 2021-01-20 DIAGNOSIS — M9903 Segmental and somatic dysfunction of lumbar region: Secondary | ICD-10-CM | POA: Diagnosis not present

## 2021-01-20 DIAGNOSIS — M5134 Other intervertebral disc degeneration, thoracic region: Secondary | ICD-10-CM | POA: Diagnosis not present

## 2021-01-20 DIAGNOSIS — M9905 Segmental and somatic dysfunction of pelvic region: Secondary | ICD-10-CM | POA: Diagnosis not present

## 2021-01-20 DIAGNOSIS — M47817 Spondylosis without myelopathy or radiculopathy, lumbosacral region: Secondary | ICD-10-CM | POA: Diagnosis not present

## 2021-01-21 ENCOUNTER — Telehealth: Payer: Self-pay | Admitting: Neurology

## 2021-01-21 ENCOUNTER — Ambulatory Visit (HOSPITAL_COMMUNITY)
Admission: RE | Admit: 2021-01-21 | Discharge: 2021-01-21 | Disposition: A | Discharge status: Home / Self Care | Payer: Medicare Other | Source: Ambulatory Visit | Attending: Cardiovascular Disease | Admitting: Cardiovascular Disease

## 2021-01-21 ENCOUNTER — Other Ambulatory Visit: Payer: Self-pay

## 2021-01-21 DIAGNOSIS — R42 Dizziness and giddiness: Secondary | ICD-10-CM | POA: Insufficient documentation

## 2021-01-25 NOTE — Telephone Encounter (Signed)
[facility] [address] [place],  [place]  [address]  Main: [phone]

## 2021-01-26 NOTE — Telephone Encounter (Signed)
Called patients sister and provided her with the Cardiologist information requested. Informed patients sister that per last note f/u will be pending results once all test are complete.   Patients sister verbalized understanding and had no further questions or concerns.

## 2021-01-28 NOTE — Progress Notes (Signed)
Pt sister advised of pt results.

## 2021-02-03 DIAGNOSIS — M5137 Other intervertebral disc degeneration, lumbosacral region: Secondary | ICD-10-CM | POA: Diagnosis not present

## 2021-02-03 DIAGNOSIS — M9905 Segmental and somatic dysfunction of pelvic region: Secondary | ICD-10-CM | POA: Diagnosis not present

## 2021-02-03 DIAGNOSIS — M9902 Segmental and somatic dysfunction of thoracic region: Secondary | ICD-10-CM | POA: Diagnosis not present

## 2021-02-03 DIAGNOSIS — M47817 Spondylosis without myelopathy or radiculopathy, lumbosacral region: Secondary | ICD-10-CM | POA: Diagnosis not present

## 2021-02-03 DIAGNOSIS — M9903 Segmental and somatic dysfunction of lumbar region: Secondary | ICD-10-CM | POA: Diagnosis not present

## 2021-02-24 DIAGNOSIS — M9902 Segmental and somatic dysfunction of thoracic region: Secondary | ICD-10-CM | POA: Diagnosis not present

## 2021-02-24 DIAGNOSIS — M47817 Spondylosis without myelopathy or radiculopathy, lumbosacral region: Secondary | ICD-10-CM | POA: Diagnosis not present

## 2021-02-24 DIAGNOSIS — M9903 Segmental and somatic dysfunction of lumbar region: Secondary | ICD-10-CM | POA: Diagnosis not present

## 2021-02-24 DIAGNOSIS — M5134 Other intervertebral disc degeneration, thoracic region: Secondary | ICD-10-CM | POA: Diagnosis not present

## 2021-02-24 DIAGNOSIS — M9905 Segmental and somatic dysfunction of pelvic region: Secondary | ICD-10-CM | POA: Diagnosis not present

## 2021-02-24 DIAGNOSIS — M5137 Other intervertebral disc degeneration, lumbosacral region: Secondary | ICD-10-CM | POA: Diagnosis not present

## 2021-03-11 NOTE — Progress Notes (Signed)
Cardiology Office Note   Date:  03/17/2021   ID:  DACARI HOFLAND, DOB 05/25/1941, MRN [id number]  PCP:  Gayland Curry, DO  Cardiologist:   Ragna Kramlich [place], MD   Chief Complaint  Patient presents with   Loss of Consciousness      History of Present Illness: Justin [name] is a 80 y.o. male who is seen at the request of Dr Tomi Likens for evaluation of dizziness and syncope. He has a history of DM type 2, HLD, and HTN. He also has some developmental difficulty.  He has had dizziness for about a year. Was given meclizine. Patient has a difficult time describing situation.  He may have associated nausea- but not always. It may last from a minute up to one hour. It happens more in the morning.    On 12/03/2020, he was getting into the shower when he suddenly felt dizzy, blacked out, falling and hitting his head on the toilet seat and sustained a laceration.  His wife's husband heard him fall and immediately went to the bathroom.  He was conscious but moaning.  He reported that he did pass out.  He was brought to the ED. Vital signs were normal.  CT head, carotid dopplers negative. Seen later by Dr Tomi Likens and EEG was normal. MRI was ordered but his sister tells me she decided not to put him through this. He denies any chest pain, palpitations, or dyspnea.     Past Medical History:  Diagnosis Date   Arthritis    Chronic kidney disease    CKD- Stage  II   Colon cancer ([place])    2014   Colon polyps    adenomatous   Diabetes mellitus without complication ([place])    Eczema    Hypercholesteremia    Hypertension     Past Surgical History:  Procedure Laterality Date   COLON SURGERY  2014 & 2015   resection of colon & removal of poylp ; Dr Kathleene Hazel, Dr Sherlene Shams   COLONOSCOPY  09/29/2013   COLONOSCOPY WITH PROPOFOL N/A 03/10/2015   Procedure: COLONOSCOPY WITH PROPOFOL;  Surgeon: Inda Castle, MD;  [facility];  Service: Endoscopy;  Laterality: N/A;   [place] N/A 07/21/2014   Procedure: FLEXIBLE SIGMOIDOSCOPY;  Surgeon: Inda Castle, MD;  [facility];  Service: Endoscopy;  Laterality: N/A;     Current Outpatient Medications  Medication Sig Dispense Refill   clotrimazole-betamethasone (LOTRISONE) cream Apply 1 application topically 2 (two) times daily. 45 g 3   diazepam (VALIUM) 5 MG tablet Take 1 tablet 40-60 minutes prior to MRI.  May repeat at MRI facility if needed. 2 tablet 0   diclofenac sodium (VOLTAREN) 1 % GEL Apply 4 g topically 4 (four) times daily. To lower back 150 g 3   docusate sodium (COLACE) 100 MG capsule Take 100 mg by mouth daily.     finasteride (PROSCAR) 5 MG tablet TAKE ONE TABLET BY MOUTH DAILY 90 tablet 1   glucose blood (ONETOUCH ULTRA) test strip USE TO CHECK BLOOD SUGARS ONCE DAILY AS DIRECTED FOR DIABETES 50 strip 10   Lancets (ONETOUCH DELICA PLUS [id number]) MISC USE UP TO 4 TIMES A DAY AS DIRECTED 100 each 2   metFORMIN (GLUCOPHAGE) 1000 MG tablet Take 1 tablet (1,000 mg total) by mouth daily with breakfast. 90 tablet 1   rosuvastatin (CRESTOR) 20 MG tablet Take 1 tablet (20 mg total) by mouth daily.  90 tablet 1   sitaGLIPtin (JANUVIA) 100 MG tablet Take 1 tablet (100 mg total) by mouth daily. 90 tablet 3   tamsulosin (FLOMAX) 0.4 MG CAPS capsule Take 1 capsule (0.4 mg total) by mouth daily. 90 capsule 1   No current facility-administered medications for this visit.    Allergies:   Patient has no known allergies.    Social History:  The patient  reports that he has never smoked. He has never used smokeless tobacco. He reports current alcohol use of about 7.0 standard drinks per week. He reports that he does not use drugs.   Family History:  The patient's family history includes Cancer in his brother; Glaucoma in his mother; Heart attack in his father; Hyperlipidemia in his sister; Hypertension in his father; Kidney disease in his mother; Meniere's disease in his sister; Stroke  in his mother; Varicose Veins in his mother.    ROS:  Please see the history of present illness.   Otherwise, review of systems are positive for none.   All other systems are reviewed and negative.    PHYSICAL EXAM: VS:  Ht '5\' 7"'$  (1.702 m)   Wt 190 lb 9.6 oz (86.5 kg)   SpO2 96%   BMI 29.85 kg/m  , BMI Body mass index is 29.85 kg/m. Orthostatic vitals: Supine BP 102/60 pulse 78 Sitting BP 101/61 pulse 70 Standing BP 101/58 pulse 85.  GEN: Well nourished, well developed, in no acute distress HEENT: normal Neck: no JVD, carotid bruits, or masses Cardiac: RRR; no murmurs, rubs, or gallops,no edema  Respiratory:  clear to auscultation bilaterally, normal work of breathing GI: soft, nontender, nondistended, + BS MS: no deformity or atrophy Skin: warm and dry, no rash Neuro:  Strength and sensation are intact Psych: euthymic mood, full affect   EKG:  EKG is ordered today. The ekg ordered today demonstrates NSR pulse 74. Normal. I have personally reviewed and interpreted this study.    Recent Labs: 12/03/2020: ALT 19 12/04/2020: BUN 46; Creat 1.71; Hemoglobin 12.9; Platelets 171; Potassium 4.7; Sodium 139    Lipid Panel    Component Value Date/Time   CHOL 225 (H) 05/11/2020 0806   CHOL 124 11/11/2015 0845   TRIG 156 (H) 05/11/2020 0806   HDL 46 05/11/2020 0806   HDL 39 (L) 11/11/2015 0845   CHOLHDL 4.9 05/11/2020 0806   VLDL 50 (H) 01/03/2017 0808   LDLCALC 150 (H) 05/11/2020 0806      Wt Readings from Last 3 Encounters:  03/17/21 190 lb 9.6 oz (86.5 kg)  01/11/21 187 lb 3.2 oz (84.9 kg)  12/04/20 183 lb 12.8 oz (83.4 kg)      Other studies Reviewed: Additional studies/ records that were reviewed today include: see above.    ASSESSMENT AND PLAN:  1.  Syncope. Etiology is unclear. Patient has had dizziness for over a year but only one syncopal episode. History is difficult to obtain. Need to consider possible arrhythmia or transient BP drop. BP tends to run low  but he is not orthostatic. Recommend stopping lisinopril. Will arrange for an Echo and 2 week Zio patch event monitor. Informed he cannot drive for 6 months.  2. HLD. On statin 3. DM on oral therapy 4. HTN. BP running low- will stop lisinopril.   Current medicines are reviewed at length with the patient today.  The patient does not have concerns regarding medicines.  The following changes have been made:  stop lisinopril  Labs/ tests ordered today include:  Echo Zio patch monitor    Disposition:   FU with me after above studies.   Signed, Fowler Antos [place], MD  03/17/2021 1:47 PM    [facility] [address] [place], [place], [address] Phone [phone], Fax [phone]

## 2021-03-17 ENCOUNTER — Other Ambulatory Visit: Payer: Self-pay

## 2021-03-17 ENCOUNTER — Ambulatory Visit: Payer: Medicare Other | Admitting: Cardiology

## 2021-03-17 ENCOUNTER — Ambulatory Visit: Payer: Medicare Other

## 2021-03-17 ENCOUNTER — Encounter: Payer: Self-pay | Admitting: Cardiology

## 2021-03-17 VITALS — Ht 67.0 in | Wt 190.6 lb

## 2021-03-17 DIAGNOSIS — M9902 Segmental and somatic dysfunction of thoracic region: Secondary | ICD-10-CM | POA: Diagnosis not present

## 2021-03-17 DIAGNOSIS — M47817 Spondylosis without myelopathy or radiculopathy, lumbosacral region: Secondary | ICD-10-CM | POA: Diagnosis not present

## 2021-03-17 DIAGNOSIS — R55 Syncope and collapse: Secondary | ICD-10-CM

## 2021-03-17 DIAGNOSIS — R42 Dizziness and giddiness: Secondary | ICD-10-CM | POA: Diagnosis not present

## 2021-03-17 DIAGNOSIS — M5137 Other intervertebral disc degeneration, lumbosacral region: Secondary | ICD-10-CM | POA: Diagnosis not present

## 2021-03-17 DIAGNOSIS — E782 Mixed hyperlipidemia: Secondary | ICD-10-CM | POA: Diagnosis not present

## 2021-03-17 DIAGNOSIS — M9903 Segmental and somatic dysfunction of lumbar region: Secondary | ICD-10-CM | POA: Diagnosis not present

## 2021-03-17 DIAGNOSIS — M9905 Segmental and somatic dysfunction of pelvic region: Secondary | ICD-10-CM | POA: Diagnosis not present

## 2021-03-17 DIAGNOSIS — E119 Type 2 diabetes mellitus without complications: Secondary | ICD-10-CM | POA: Diagnosis not present

## 2021-03-17 DIAGNOSIS — E785 Hyperlipidemia, unspecified: Secondary | ICD-10-CM | POA: Diagnosis not present

## 2021-03-17 DIAGNOSIS — M5134 Other intervertebral disc degeneration, thoracic region: Secondary | ICD-10-CM | POA: Diagnosis not present

## 2021-03-17 NOTE — Patient Instructions (Signed)
Medication Instructions:   Stop Lisinopril Continue all other medications    Lab Work:  None ordered   Testing/Procedures:  Schedule Echo  2 week Zio Monitor   Follow-Up: At [facility], you and your health needs are our priority.  As part of our continuing mission to provide you with exceptional heart care, we have created designated Provider Care Teams.  These Care Teams include your primary Cardiologist (physician) and Advanced Practice Providers (APPs -  Physician Assistants and Nurse Practitioners) who all work together to provide you with the care you need, when you need it.  We recommend signing up for the patient portal called "MyChart".  Sign up information is provided on this After Visit Summary.  MyChart is used to connect with patients for Virtual Visits (Telemedicine).  Patients are able to view lab/test results, encounter notes, upcoming appointments, etc.  Non-urgent messages can be sent to your provider as well.   To learn more about what you can do with MyChart, go to [email or web address].     Your next appointment:   After test    The format for your next appointment:  Office   Provider:  Dr.Jordan

## 2021-03-17 NOTE — Progress Notes (Unsigned)
Enrolled patient for a 14 day Zio XT monitor to be mailed to patients home  Per patient monitor will be mailed out on 04-12-21

## 2021-03-17 NOTE — Progress Notes (Unsigned)
Enrolled patient for a 14 day Zio AT monitor to be mailed to patients home.  

## 2021-03-17 NOTE — Addendum Note (Signed)
Addended by: Kathyrn Lass on: 03/17/2021 02:48 PM   Modules accepted: Orders

## 2021-03-21 IMAGING — CT CT ANGIOGRAPHY ABDOMEN AND PELVIS WITH CONTRAST AND WITHOUT CONT
2 of 4 series · 13 of 32 positions shown, 18 images · IV contrast (APPLIED)
Comparison: None.

CLINICAL DATA: Splenic artery aneurysm noted on radiography.
Chronic low back pain and pelvic pain. History of colon carcinoma,
resected.

EXAM:
CTA ABDOMEN AND PELVIS WITH CONTRAST
TECHNIQUE: Multidetector CT imaging of the abdomen and pelvis was performed
using the standard protocol during bolus administration of
intravenous contrast. Multiplanar reconstructed images and MIPs were
obtained and reviewed to evaluate the vascular anatomy.
CONTRAST:  75mL [id number] IOPAMIDOL ([id number]) INJECTION 76%

[Series 4: mesenteric angio · axial · 0.73mm/px · z∈[-440,-172]mm · 6 of 149 slices shown]
[im 10/149  soft-tissue]
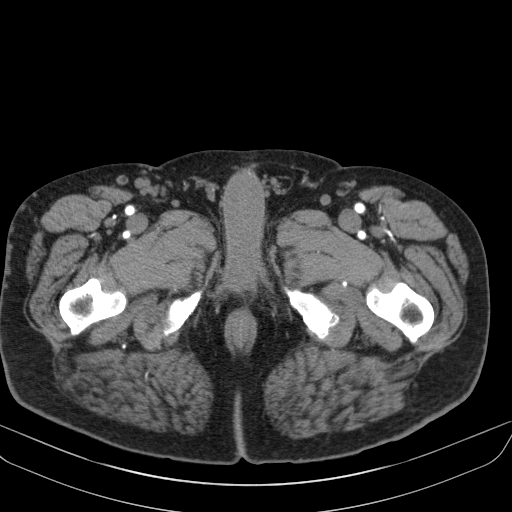
[im 30/149  soft-tissue]
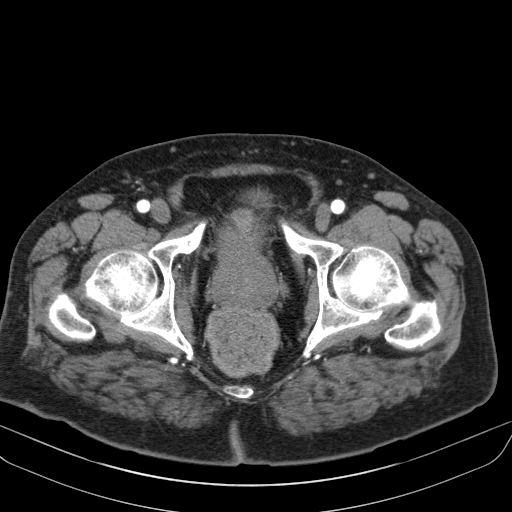
[im 50/149  soft-tissue]
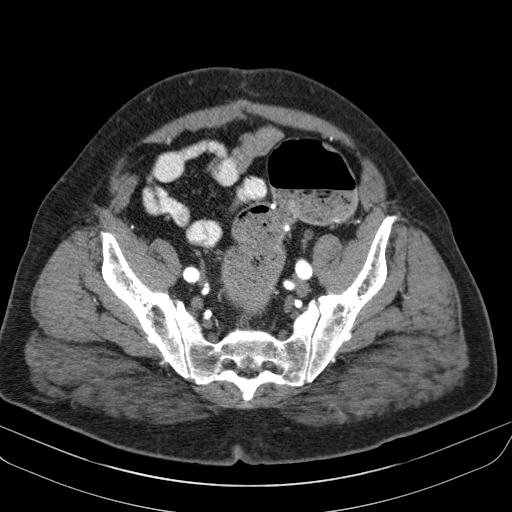
[im 70/149  soft-tissue]
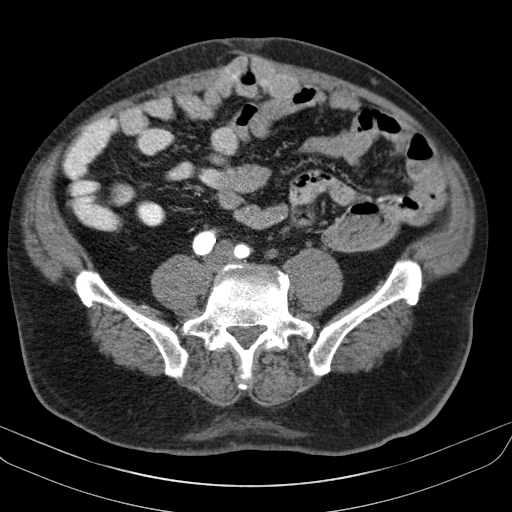
[im 79/149  soft-tissue]
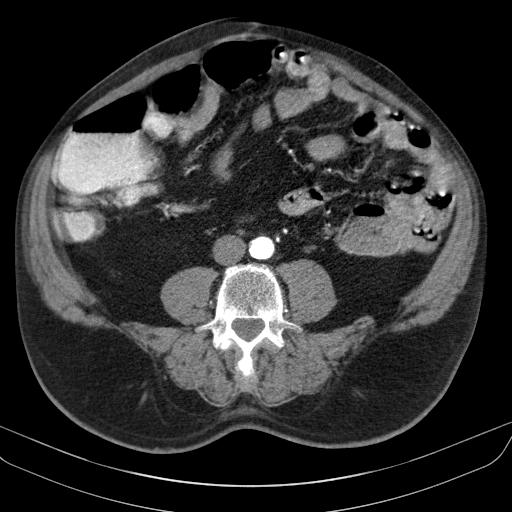
[im 99/149  soft-tissue]
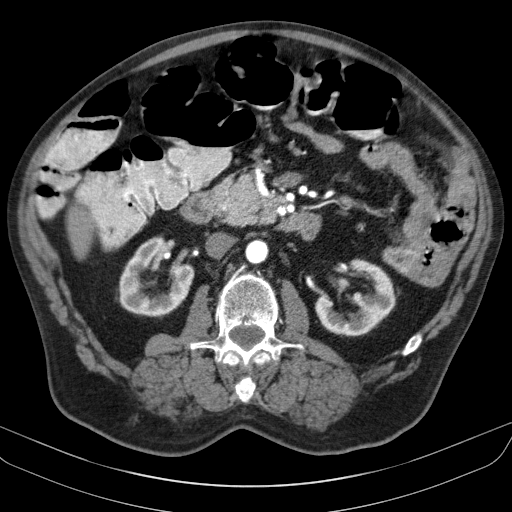

[Series 5: venous · axial · portal-venous · 0.75mm/px · z∈[-387,-77]mm · 7 of 84 slices shown, 12 images]
[im 11/84  soft-tissue]
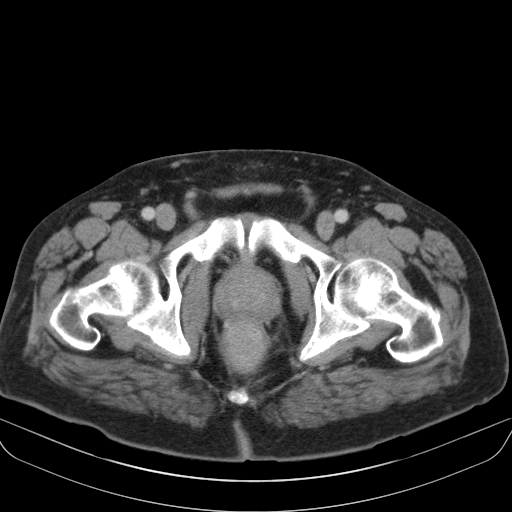
[im 11/84  bone]
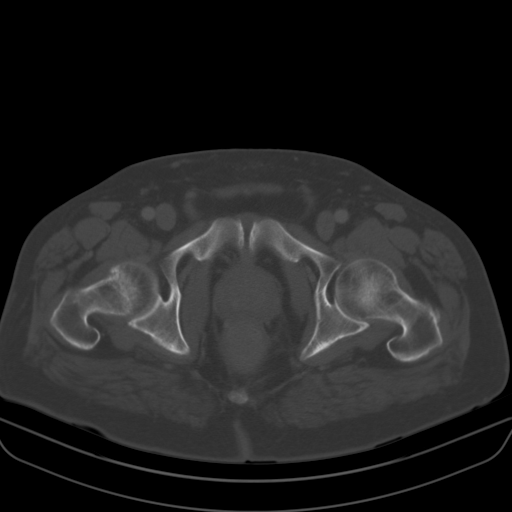
[im 21/84  soft-tissue]
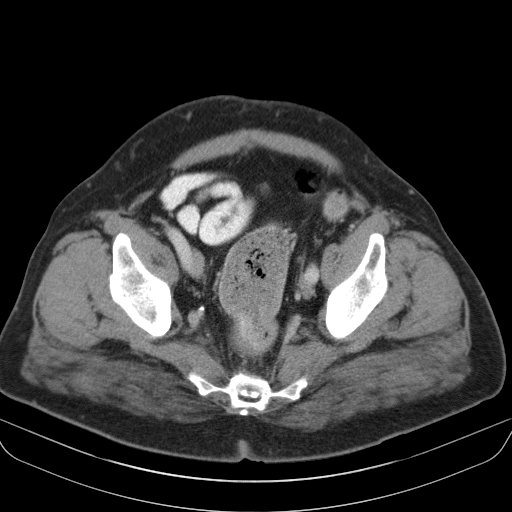
[im 32/84  soft-tissue]
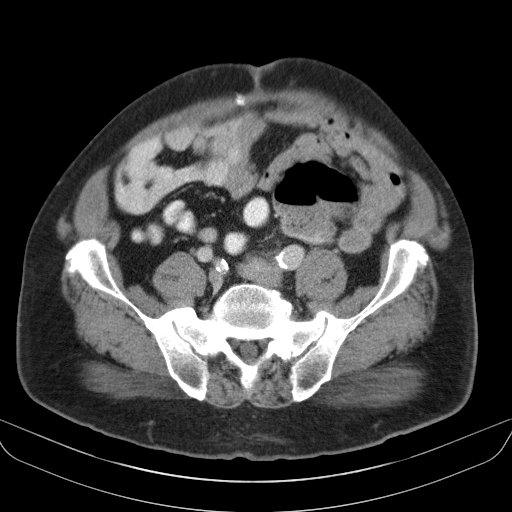
[im 42/84  soft-tissue]
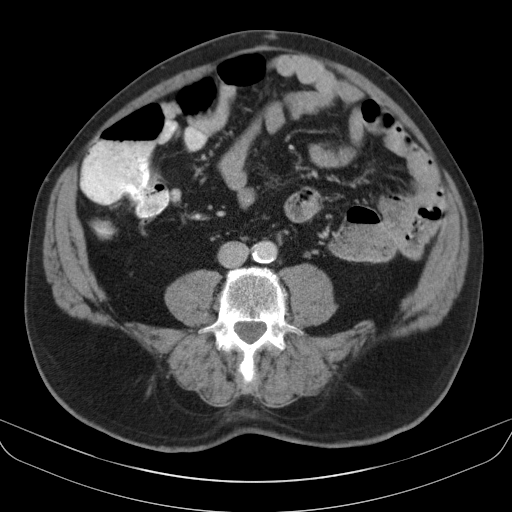
[im 42/84  lung]
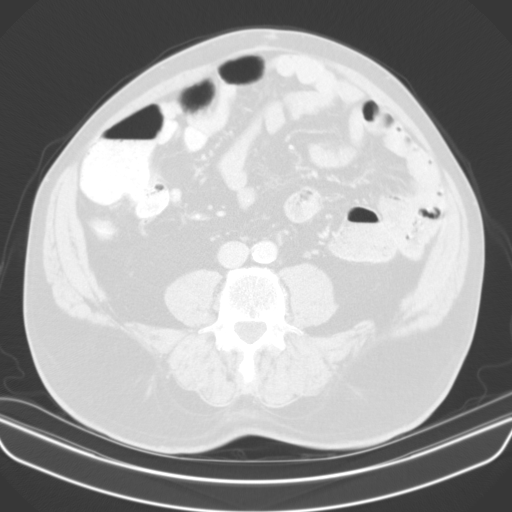
[im 52/84  soft-tissue]
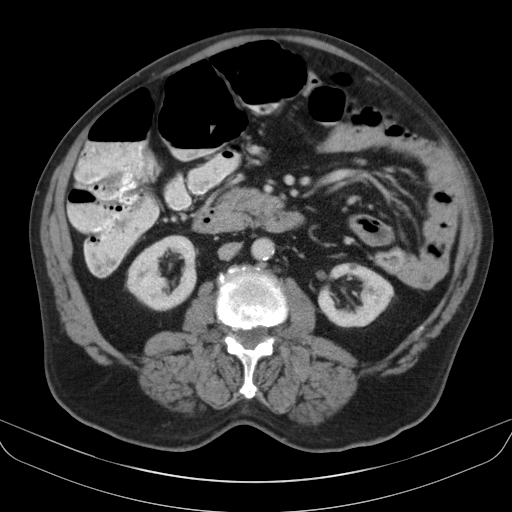
[im 52/84  lung]
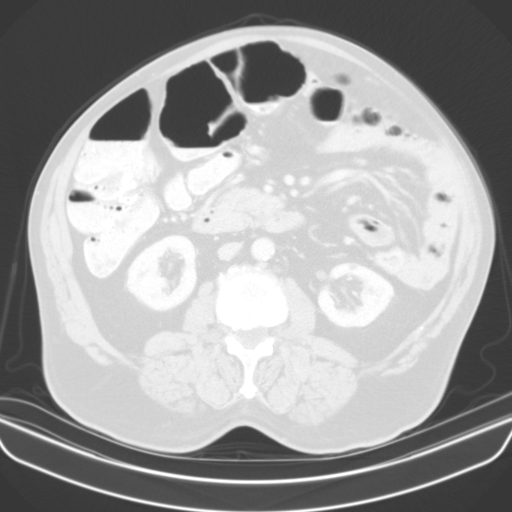
[im 63/84  soft-tissue]
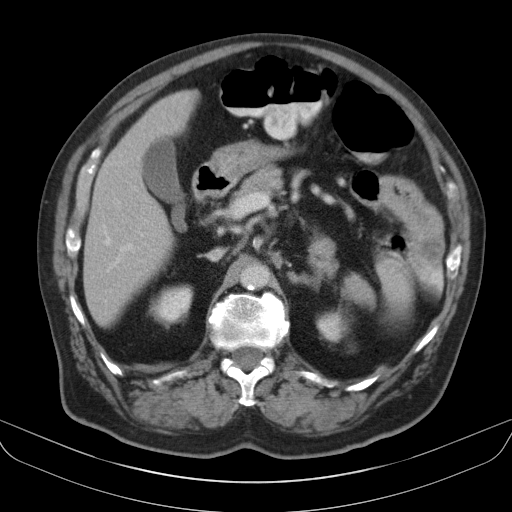
[im 63/84  lung]
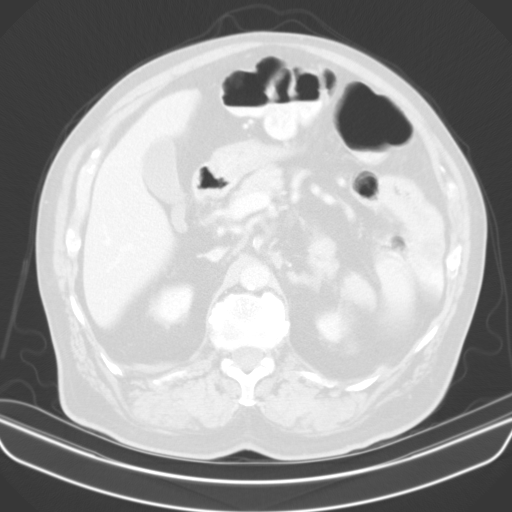
[im 73/84  soft-tissue]
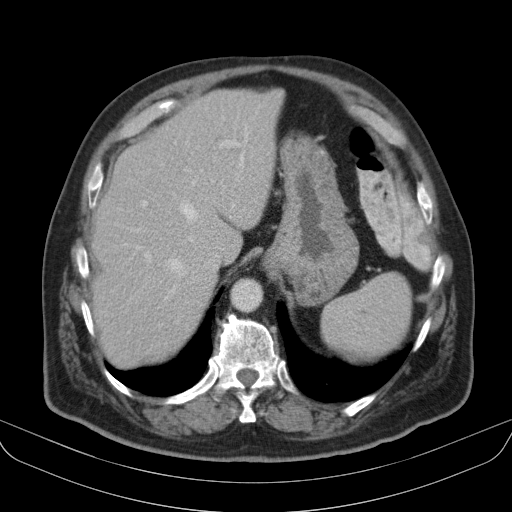
[im 73/84  lung]
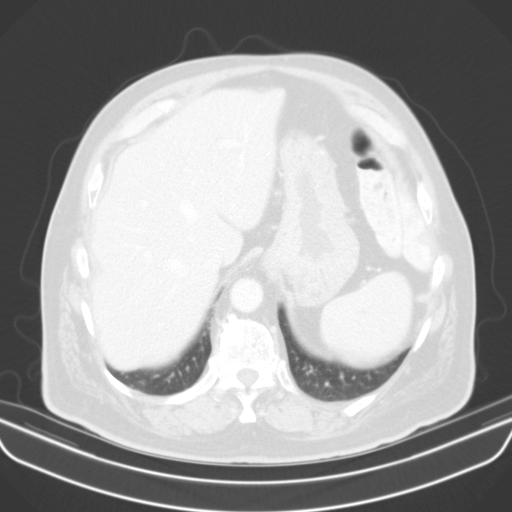

[13 of 32 positions shown; findings below may reference images not displayed]

FINDINGS: VASCULAR

Aorta: Mild tortuosity. Scattered calcified atheromatous plaque
particularly in the infrarenal segment. No dissection, aneurysm, or
stenosis.

Celiac: Origin occlusion, reconstituted distally by collaterals.
cm peripherally calcified distal splenic artery saccular aneurysm.

SMA: Widely patent. Replaced hepatic arterial supply, an anatomic
variant.

Renals: Single left, widely patent.

Single right, widely patent.

IMA: Patent without evidence of aneurysm, dissection, vasculitis or
significant stenosis.

Inflow: Tortuous iliac arterial system with scattered calcified
nonocclusive plaque. No aneurysm or dissection.

Proximal Outflow: Bilateral common femoral and visualized portions
of the superficial and profunda femoral arteries are patent without
evidence of aneurysm, dissection, vasculitis or significant
stenosis.

Veins: Patent hepatic veins, portal vein, SMV, splenic vein,
bilateral renal veins, iliac venous system and IVC. No venous
pathology identified.

Review of the MIP images confirms the above findings.

NON-VASCULAR

Lower chest: No pleural or pericardial effusion. Small hiatal
hernia.

Hepatobiliary: No focal liver abnormality is seen. No gallstones,
gallbladder wall thickening, or biliary dilatation.

Pancreas: Unremarkable. No pancreatic ductal dilatation or
surrounding inflammatory changes.

Spleen: Normal in size without focal abnormality.

Adrenals/Urinary Tract: Normal adrenals. 1.4 cm exophytic cyst from
the upper pole left kidney. No hydronephrosis. Urinary bladder
incompletely distended, thick-walled.

Stomach/Bowel: Small hiatal hernia. The stomach is nondistended.
Small bowel decompressed. Colon is nondilated. Anastomotic staple
line in the mid sigmoid segment.

Lymphatic: No abdominal or pelvic adenopathy.

Reproductive: Mild prostate enlargement

Other: No ascites. No free air.

Musculoskeletal: No acute or significant osseous findings.
IMPRESSION: 1. 1.5 cm distal splenic artery saccular aneurysm. Visceral
aneurysms less than 2 cm are at low risk of fracture.
2. Celiac axis origin occlusion, reconstituted distally by visceral
collaterals.
3. Aortoiliac atherosclerosis (WC92K-170.0) without stenosis or
aneurysm.
4. Small hiatal hernia.

## 2021-03-22 ENCOUNTER — Ambulatory Visit: Payer: Medicare Other | Admitting: Family

## 2021-03-23 DIAGNOSIS — R55 Syncope and collapse: Secondary | ICD-10-CM | POA: Diagnosis not present

## 2021-03-23 DIAGNOSIS — E1140 Type 2 diabetes mellitus with diabetic neuropathy, unspecified: Secondary | ICD-10-CM | POA: Diagnosis not present

## 2021-03-23 DIAGNOSIS — H6123 Impacted cerumen, bilateral: Secondary | ICD-10-CM | POA: Diagnosis not present

## 2021-03-23 DIAGNOSIS — E1169 Type 2 diabetes mellitus with other specified complication: Secondary | ICD-10-CM | POA: Diagnosis not present

## 2021-03-23 DIAGNOSIS — E1122 Type 2 diabetes mellitus with diabetic chronic kidney disease: Secondary | ICD-10-CM | POA: Diagnosis not present

## 2021-03-23 DIAGNOSIS — H6120 Impacted cerumen, unspecified ear: Secondary | ICD-10-CM | POA: Diagnosis not present

## 2021-03-25 ENCOUNTER — Ambulatory Visit (HOSPITAL_COMMUNITY): Payer: Medicare Other | Attending: Cardiology

## 2021-03-25 ENCOUNTER — Other Ambulatory Visit: Payer: Self-pay

## 2021-03-25 DIAGNOSIS — R55 Syncope and collapse: Secondary | ICD-10-CM | POA: Diagnosis not present

## 2021-03-25 LAB — ECHOCARDIOGRAM COMPLETE
AR max vel: 1.97 cm2
AV Area VTI: 2.21 cm2
AV Area mean vel: 2.1 cm2
AV Mean grad: 9 mmHg
AV Peak grad: 21.1 mmHg
Ao pk vel: 2.3 m/s
Area-P 1/2: 3.12 cm2
P 1/2 time: 655 msec
S' Lateral: 3.6 cm
Single Plane A2C EF: 63.9 %

## 2021-04-07 DIAGNOSIS — M9903 Segmental and somatic dysfunction of lumbar region: Secondary | ICD-10-CM | POA: Diagnosis not present

## 2021-04-07 DIAGNOSIS — M9902 Segmental and somatic dysfunction of thoracic region: Secondary | ICD-10-CM | POA: Diagnosis not present

## 2021-04-07 DIAGNOSIS — M5137 Other intervertebral disc degeneration, lumbosacral region: Secondary | ICD-10-CM | POA: Diagnosis not present

## 2021-04-07 DIAGNOSIS — M47817 Spondylosis without myelopathy or radiculopathy, lumbosacral region: Secondary | ICD-10-CM | POA: Diagnosis not present

## 2021-04-07 DIAGNOSIS — M5134 Other intervertebral disc degeneration, thoracic region: Secondary | ICD-10-CM | POA: Diagnosis not present

## 2021-04-07 DIAGNOSIS — M9905 Segmental and somatic dysfunction of pelvic region: Secondary | ICD-10-CM | POA: Diagnosis not present

## 2021-04-19 ENCOUNTER — Ambulatory Visit (INDEPENDENT_AMBULATORY_CARE_PROVIDER_SITE_OTHER): Payer: Medicare Other | Admitting: Podiatry

## 2021-04-19 ENCOUNTER — Encounter: Payer: Self-pay | Admitting: Podiatry

## 2021-04-19 ENCOUNTER — Other Ambulatory Visit: Payer: Self-pay

## 2021-04-19 DIAGNOSIS — E1122 Type 2 diabetes mellitus with diabetic chronic kidney disease: Secondary | ICD-10-CM | POA: Diagnosis not present

## 2021-04-19 DIAGNOSIS — B351 Tinea unguium: Secondary | ICD-10-CM | POA: Diagnosis not present

## 2021-04-19 DIAGNOSIS — N182 Chronic kidney disease, stage 2 (mild): Secondary | ICD-10-CM

## 2021-04-19 DIAGNOSIS — M79675 Pain in left toe(s): Secondary | ICD-10-CM

## 2021-04-19 DIAGNOSIS — M79674 Pain in right toe(s): Secondary | ICD-10-CM | POA: Diagnosis not present

## 2021-04-19 NOTE — Progress Notes (Signed)
Subjective: Justin [name] is a 80 y.o. male patient seen today for follow up of  painful thick toenails that are difficult to trim. Pain interferes with ambulation. Aggravating factors include wearing enclosed shoe gear. Pain is relieved with periodic professional debridement.  Patient  is diabetic and states his blood glucose was 186 mg/dl today. Last A1c was 5.9%.  PCP is Reed, Tiffany L, DO. Last visit was: 12/29/2020.  New problems reported today: None.  No Known Allergies  PCP is Reed, Tiffany L, DO .  Objective: Physical Exam  General: Patient is a pleasant 79 y.o. Caucasian male in NAD. AAO x 3.   Neurovascular Examination: Capillary refill time to digits immediate b/l. Palpable pedal pulses b/l LE. Pedal hair present. Lower extremity skin temperature gradient within normal limits. No edema noted b/l lower extremities. Varicosities present b/l.  Protective sensation intact 5/5 intact bilaterally with 10g monofilament b/l. Vibratory sensation intact b/l.   Dermatological:  Skin warm and supple b/l lower extremities. No open wounds b/l lower extremities. No interdigital macerations b/l lower extremities. Toenails 1-5 b/l elongated, discolored, dystrophic, thickened, crumbly with subungual debris and tenderness to dorsal palpation. No hyperkeratotic nor porokeratotic lesions present on today's visit.  Musculoskeletal:  Normal muscle strength 5/5 to all lower extremity muscle groups bilaterally. No pain crepitus or joint limitation noted with ROM b/l lower extremities. Hallux valgus with bunion deformity noted b/l lower extremities. Utilizes cane for ambulation assistance.   Assessment: 1. 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 Type 2 diabetes mellitus with stage 2 chronic kidney disease, without long-term current use of insulin ([place])     Plan: Patient was evaluated and treated and all questions answered. Consent given for treatment as described  below: -Examined patient. -Continue diabetic foot care principles: inspect feet daily, monitor glucose as recommended by PCP and/or Endocrinologist, and follow prescribed diet per PCP, Endocrinologist and/or dietician. -Patient to continue soft, supportive shoe gear daily. -Toenails 1-5 b/l were debrided in length and girth with sterile nail nippers and dremel without iatrogenic bleeding.  -Patient to report any pedal injuries to medical professional immediately. -Patient/POA to call should there be question/concern in the interim.  Return in about 3 months (around 07/19/2021).  Marzetta Board, DPM

## 2021-04-24 ENCOUNTER — Other Ambulatory Visit: Payer: Self-pay | Admitting: Family

## 2021-04-24 DIAGNOSIS — E1122 Type 2 diabetes mellitus with diabetic chronic kidney disease: Secondary | ICD-10-CM

## 2021-05-06 DIAGNOSIS — M9905 Segmental and somatic dysfunction of pelvic region: Secondary | ICD-10-CM | POA: Diagnosis not present

## 2021-05-06 DIAGNOSIS — M5137 Other intervertebral disc degeneration, lumbosacral region: Secondary | ICD-10-CM | POA: Diagnosis not present

## 2021-05-06 DIAGNOSIS — M9903 Segmental and somatic dysfunction of lumbar region: Secondary | ICD-10-CM | POA: Diagnosis not present

## 2021-05-06 DIAGNOSIS — M9902 Segmental and somatic dysfunction of thoracic region: Secondary | ICD-10-CM | POA: Diagnosis not present

## 2021-05-06 DIAGNOSIS — M5134 Other intervertebral disc degeneration, thoracic region: Secondary | ICD-10-CM | POA: Diagnosis not present

## 2021-05-06 DIAGNOSIS — M47817 Spondylosis without myelopathy or radiculopathy, lumbosacral region: Secondary | ICD-10-CM | POA: Diagnosis not present

## 2021-05-11 DIAGNOSIS — H01134 Eczematous dermatitis of left upper eyelid: Secondary | ICD-10-CM | POA: Diagnosis not present

## 2021-05-11 DIAGNOSIS — H0102B Squamous blepharitis left eye, upper and lower eyelids: Secondary | ICD-10-CM | POA: Diagnosis not present

## 2021-05-11 DIAGNOSIS — H01135 Eczematous dermatitis of left lower eyelid: Secondary | ICD-10-CM | POA: Diagnosis not present

## 2021-05-11 DIAGNOSIS — H0102A Squamous blepharitis right eye, upper and lower eyelids: Secondary | ICD-10-CM | POA: Diagnosis not present

## 2021-05-11 DIAGNOSIS — H16143 Punctate keratitis, bilateral: Secondary | ICD-10-CM | POA: Diagnosis not present

## 2021-05-11 DIAGNOSIS — H02112 Cicatricial ectropion of right lower eyelid: Secondary | ICD-10-CM | POA: Diagnosis not present

## 2021-05-11 DIAGNOSIS — H02115 Cicatricial ectropion of left lower eyelid: Secondary | ICD-10-CM | POA: Diagnosis not present

## 2021-05-11 DIAGNOSIS — H01132 Eczematous dermatitis of right lower eyelid: Secondary | ICD-10-CM | POA: Diagnosis not present

## 2021-05-11 DIAGNOSIS — H01131 Eczematous dermatitis of right upper eyelid: Secondary | ICD-10-CM | POA: Diagnosis not present

## 2021-05-25 DIAGNOSIS — H6123 Impacted cerumen, bilateral: Secondary | ICD-10-CM | POA: Diagnosis not present

## 2021-05-25 DIAGNOSIS — R55 Syncope and collapse: Secondary | ICD-10-CM | POA: Diagnosis not present

## 2021-05-25 DIAGNOSIS — E1140 Type 2 diabetes mellitus with diabetic neuropathy, unspecified: Secondary | ICD-10-CM | POA: Diagnosis not present

## 2021-05-25 DIAGNOSIS — E1122 Type 2 diabetes mellitus with diabetic chronic kidney disease: Secondary | ICD-10-CM | POA: Diagnosis not present

## 2021-05-25 DIAGNOSIS — H6120 Impacted cerumen, unspecified ear: Secondary | ICD-10-CM | POA: Diagnosis not present

## 2021-05-27 ENCOUNTER — Ambulatory Visit: Payer: Medicare Other | Admitting: Cardiology

## 2021-05-27 ENCOUNTER — Other Ambulatory Visit: Payer: Self-pay | Admitting: Family

## 2021-05-27 DIAGNOSIS — H01131 Eczematous dermatitis of right upper eyelid: Secondary | ICD-10-CM | POA: Diagnosis not present

## 2021-05-27 DIAGNOSIS — H01132 Eczematous dermatitis of right lower eyelid: Secondary | ICD-10-CM | POA: Diagnosis not present

## 2021-05-27 DIAGNOSIS — H02112 Cicatricial ectropion of right lower eyelid: Secondary | ICD-10-CM | POA: Diagnosis not present

## 2021-05-27 DIAGNOSIS — H16143 Punctate keratitis, bilateral: Secondary | ICD-10-CM | POA: Diagnosis not present

## 2021-05-27 DIAGNOSIS — H02115 Cicatricial ectropion of left lower eyelid: Secondary | ICD-10-CM | POA: Diagnosis not present

## 2021-05-27 DIAGNOSIS — E119 Type 2 diabetes mellitus without complications: Secondary | ICD-10-CM | POA: Diagnosis not present

## 2021-05-27 DIAGNOSIS — E782 Mixed hyperlipidemia: Secondary | ICD-10-CM

## 2021-05-27 DIAGNOSIS — H01135 Eczematous dermatitis of left lower eyelid: Secondary | ICD-10-CM | POA: Diagnosis not present

## 2021-05-27 DIAGNOSIS — H01134 Eczematous dermatitis of left upper eyelid: Secondary | ICD-10-CM | POA: Diagnosis not present

## 2021-05-27 DIAGNOSIS — H0102A Squamous blepharitis right eye, upper and lower eyelids: Secondary | ICD-10-CM | POA: Diagnosis not present

## 2021-05-27 DIAGNOSIS — H0102B Squamous blepharitis left eye, upper and lower eyelids: Secondary | ICD-10-CM | POA: Diagnosis not present

## 2021-05-27 DIAGNOSIS — H2513 Age-related nuclear cataract, bilateral: Secondary | ICD-10-CM | POA: Diagnosis not present

## 2021-05-31 ENCOUNTER — Encounter: Payer: Medicare Other | Admitting: Family

## 2021-06-03 DIAGNOSIS — M9905 Segmental and somatic dysfunction of pelvic region: Secondary | ICD-10-CM | POA: Diagnosis not present

## 2021-06-03 DIAGNOSIS — M47817 Spondylosis without myelopathy or radiculopathy, lumbosacral region: Secondary | ICD-10-CM | POA: Diagnosis not present

## 2021-06-03 DIAGNOSIS — M9902 Segmental and somatic dysfunction of thoracic region: Secondary | ICD-10-CM | POA: Diagnosis not present

## 2021-06-03 DIAGNOSIS — M5137 Other intervertebral disc degeneration, lumbosacral region: Secondary | ICD-10-CM | POA: Diagnosis not present

## 2021-06-03 DIAGNOSIS — M5134 Other intervertebral disc degeneration, thoracic region: Secondary | ICD-10-CM | POA: Diagnosis not present

## 2021-06-03 DIAGNOSIS — M9903 Segmental and somatic dysfunction of lumbar region: Secondary | ICD-10-CM | POA: Diagnosis not present

## 2021-07-01 DIAGNOSIS — M9903 Segmental and somatic dysfunction of lumbar region: Secondary | ICD-10-CM | POA: Diagnosis not present

## 2021-07-01 DIAGNOSIS — M5134 Other intervertebral disc degeneration, thoracic region: Secondary | ICD-10-CM | POA: Diagnosis not present

## 2021-07-01 DIAGNOSIS — M5137 Other intervertebral disc degeneration, lumbosacral region: Secondary | ICD-10-CM | POA: Diagnosis not present

## 2021-07-01 DIAGNOSIS — M9902 Segmental and somatic dysfunction of thoracic region: Secondary | ICD-10-CM | POA: Diagnosis not present

## 2021-07-01 DIAGNOSIS — M47817 Spondylosis without myelopathy or radiculopathy, lumbosacral region: Secondary | ICD-10-CM | POA: Diagnosis not present

## 2021-07-01 DIAGNOSIS — M9905 Segmental and somatic dysfunction of pelvic region: Secondary | ICD-10-CM | POA: Diagnosis not present

## 2021-07-16 ENCOUNTER — Telehealth: Payer: Self-pay

## 2021-07-16 NOTE — Telephone Encounter (Signed)
849 am.  Incoming call from [address].  Patient is at [facility] on [address]. Blood work was ordered by Dr. Mariea Clonts but technician is unable to locate the orders.  Notified Dr. Mariea Clonts of situation.  Orders are located but under the incorrect birth date.  Birth date corrected.  Notified Mardene Celeste of above and she will schedule patient for blood work next Monday.

## 2021-07-23 DIAGNOSIS — E11610 Type 2 diabetes mellitus with diabetic neuropathic arthropathy: Secondary | ICD-10-CM | POA: Diagnosis not present

## 2021-07-29 DIAGNOSIS — M9903 Segmental and somatic dysfunction of lumbar region: Secondary | ICD-10-CM | POA: Diagnosis not present

## 2021-07-29 DIAGNOSIS — M9905 Segmental and somatic dysfunction of pelvic region: Secondary | ICD-10-CM | POA: Diagnosis not present

## 2021-07-29 DIAGNOSIS — M5134 Other intervertebral disc degeneration, thoracic region: Secondary | ICD-10-CM | POA: Diagnosis not present

## 2021-07-29 DIAGNOSIS — M9902 Segmental and somatic dysfunction of thoracic region: Secondary | ICD-10-CM | POA: Diagnosis not present

## 2021-07-29 DIAGNOSIS — M47817 Spondylosis without myelopathy or radiculopathy, lumbosacral region: Secondary | ICD-10-CM | POA: Diagnosis not present

## 2021-07-29 DIAGNOSIS — M5137 Other intervertebral disc degeneration, lumbosacral region: Secondary | ICD-10-CM | POA: Diagnosis not present

## 2021-08-04 DIAGNOSIS — E1169 Type 2 diabetes mellitus with other specified complication: Secondary | ICD-10-CM | POA: Diagnosis not present

## 2021-08-04 DIAGNOSIS — E785 Hyperlipidemia, unspecified: Secondary | ICD-10-CM | POA: Diagnosis not present

## 2021-08-26 DIAGNOSIS — M9902 Segmental and somatic dysfunction of thoracic region: Secondary | ICD-10-CM | POA: Diagnosis not present

## 2021-08-26 DIAGNOSIS — M5134 Other intervertebral disc degeneration, thoracic region: Secondary | ICD-10-CM | POA: Diagnosis not present

## 2021-08-26 DIAGNOSIS — M5137 Other intervertebral disc degeneration, lumbosacral region: Secondary | ICD-10-CM | POA: Diagnosis not present

## 2021-08-26 DIAGNOSIS — M47817 Spondylosis without myelopathy or radiculopathy, lumbosacral region: Secondary | ICD-10-CM | POA: Diagnosis not present

## 2021-08-26 DIAGNOSIS — M9905 Segmental and somatic dysfunction of pelvic region: Secondary | ICD-10-CM | POA: Diagnosis not present

## 2021-08-26 DIAGNOSIS — M9903 Segmental and somatic dysfunction of lumbar region: Secondary | ICD-10-CM | POA: Diagnosis not present

## 2021-09-23 DIAGNOSIS — M9902 Segmental and somatic dysfunction of thoracic region: Secondary | ICD-10-CM | POA: Diagnosis not present

## 2021-09-23 DIAGNOSIS — M5134 Other intervertebral disc degeneration, thoracic region: Secondary | ICD-10-CM | POA: Diagnosis not present

## 2021-09-23 DIAGNOSIS — M5137 Other intervertebral disc degeneration, lumbosacral region: Secondary | ICD-10-CM | POA: Diagnosis not present

## 2021-09-23 DIAGNOSIS — M9905 Segmental and somatic dysfunction of pelvic region: Secondary | ICD-10-CM | POA: Diagnosis not present

## 2021-09-23 DIAGNOSIS — M9903 Segmental and somatic dysfunction of lumbar region: Secondary | ICD-10-CM | POA: Diagnosis not present

## 2021-09-23 DIAGNOSIS — M47817 Spondylosis without myelopathy or radiculopathy, lumbosacral region: Secondary | ICD-10-CM | POA: Diagnosis not present

## 2021-09-30 DIAGNOSIS — Z9189 Other specified personal risk factors, not elsewhere classified: Secondary | ICD-10-CM | POA: Diagnosis not present

## 2021-09-30 DIAGNOSIS — H9190 Unspecified hearing loss, unspecified ear: Secondary | ICD-10-CM | POA: Diagnosis not present

## 2021-10-11 ENCOUNTER — Ambulatory Visit (INDEPENDENT_AMBULATORY_CARE_PROVIDER_SITE_OTHER): Payer: Medicare Other | Admitting: Podiatry

## 2021-10-11 ENCOUNTER — Other Ambulatory Visit: Payer: Self-pay

## 2021-10-11 ENCOUNTER — Encounter: Payer: Self-pay | Admitting: Podiatry

## 2021-10-11 DIAGNOSIS — M2012 Hallux valgus (acquired), left foot: Secondary | ICD-10-CM | POA: Diagnosis not present

## 2021-10-11 DIAGNOSIS — M2041 Other hammer toe(s) (acquired), right foot: Secondary | ICD-10-CM

## 2021-10-11 DIAGNOSIS — B351 Tinea unguium: Secondary | ICD-10-CM

## 2021-10-11 DIAGNOSIS — M2042 Other hammer toe(s) (acquired), left foot: Secondary | ICD-10-CM

## 2021-10-11 DIAGNOSIS — E119 Type 2 diabetes mellitus without complications: Secondary | ICD-10-CM

## 2021-10-11 DIAGNOSIS — M79674 Pain in right toe(s): Secondary | ICD-10-CM

## 2021-10-11 DIAGNOSIS — M2011 Hallux valgus (acquired), right foot: Secondary | ICD-10-CM

## 2021-10-11 DIAGNOSIS — N182 Chronic kidney disease, stage 2 (mild): Secondary | ICD-10-CM | POA: Diagnosis not present

## 2021-10-11 DIAGNOSIS — E1122 Type 2 diabetes mellitus with diabetic chronic kidney disease: Secondary | ICD-10-CM | POA: Diagnosis not present

## 2021-10-11 DIAGNOSIS — M79675 Pain in left toe(s): Secondary | ICD-10-CM | POA: Diagnosis not present

## 2021-10-11 NOTE — Progress Notes (Signed)
ANNUAL DIABETIC FOOT EXAM ? ?Subjective: ?Justin [name] presents today for annual diabetic foot examination. ? ?Patient relates 81 year h/o diabetes. ? ?Patient denies any h/o foot wounds. ? ?Patient denies any numbness, tingling, burning, or pins/needle sensation in feet. ? ?Patient's blood sugar was 97 mg/dl yesterday. Patient did not check blood glucose this morning. ? ?Risk factors: diabetes, HTN, CKD, hyperlipidemia. ? ?Reed, Tiffany L, DO is patient's PCP. Last visit was February, 2023. ? ?Past Medical History:  ?Diagnosis Date  ? Arthritis   ? Chronic kidney disease   ? CKD- Stage  II  ? Colon cancer ([place])   ? 2014  ? Colon polyps   ? adenomatous  ? Diabetes mellitus without complication ([place])   ? Eczema   ? Hypercholesteremia   ? Hypertension   ? ?Patient Active Problem List  ? Diagnosis Date Noted  ? Dizziness 10/15/2019  ? 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 Bilateral impacted cerumen 06/10/2019  ? 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  ? Seasonal allergies 03/25/2019  ? Mid back pain 03/25/2014  ? Tubular adenoma of colon 03/25/2014  ? CN (constipation) 03/25/2014  ? Type 2 diabetes mellitus with stage 2 chronic kidney disease, without long-term current use of insulin ([place]) 03/25/2014  ? Anemia, iron deficiency 03/25/2014  ? Essential hypertension, benign 03/25/2014  ? Hyperlipidemia 03/25/2014  ? Personal history of colon cancer, stage II 03/25/2014  ? Prostate nodule without urinary obstruction 03/25/2014  ? ?Past Surgical History:  ?Procedure Laterality Date  ? COLON SURGERY  2014 & 2015  ? resection of colon & removal of poylp ; Dr Kathleene Hazel, Dr Sherlene Shams  ? COLONOSCOPY  09/29/2013  ? COLONOSCOPY WITH PROPOFOL N/A 03/10/2015  ? Procedure: COLONOSCOPY WITH PROPOFOL;  Surgeon: Inda Castle, MD;  Location: [facility];  Service: Endoscopy;  Laterality: N/A;  ? EXTERNAL EAR SURGERY  1954  ? FLEXIBLE SIGMOIDOSCOPY N/A 07/21/2014  ? Procedure: FLEXIBLE SIGMOIDOSCOPY;  Surgeon:  Inda Castle, MD;  Location: [facility];  Service: Endoscopy;  Laterality: N/A;  ? ?Current Outpatient Medications on File Prior to Visit  ?Medication Sig Dispense Refill  ? clotrimazole-betamethasone (LOTRISONE) cream Apply 1 application topically 2 (two) times daily. 45 g 3  ? diazepam (VALIUM) 5 MG tablet Take 1 tablet 40-60 minutes prior to MRI.  May repeat at MRI facility if needed. 2 tablet 0  ? diclofenac sodium (VOLTAREN) 1 % GEL Apply 4 g topically 4 (four) times daily. To lower back 150 g 3  ? docusate sodium (COLACE) 100 MG capsule Take 100 mg by mouth daily.    ? finasteride (PROSCAR) 5 MG tablet TAKE ONE TABLET BY MOUTH DAILY 90 tablet 1  ? glucose blood (ONETOUCH ULTRA) test strip USE TO CHECK BLOOD SUGARS ONCE DAILY AS DIRECTED FOR DIABETES 50 strip 10  ? Lancets (ONETOUCH DELICA PLUS [id number]) MISC USE UP TO 4 TIMES A DAY AS DIRECTED 100 each 2  ? metFORMIN (GLUCOPHAGE) 1000 MG tablet TAKE ONE TABLET BY MOUTH DAILY WITH BREAKFAST 90 tablet 1  ? mometasone (ELOCON) 0.1 % ointment SMARTSIG:Sparingly Topical Twice Daily    ? PFIZER-BIONT COVID-19 VAC-TRIS SUSP injection     ? rosuvastatin (CRESTOR) 20 MG tablet Take 1 tablet (20 mg total) by mouth daily. 90 tablet 1  ? sitaGLIPtin (JANUVIA) 100 MG tablet Take 1 tablet (100 mg total) by mouth daily. 90 tablet 3  ? tamsulosin (FLOMAX) 0.4 MG CAPS capsule Take 1 capsule (0.4 mg total) by  mouth daily. 90 capsule 1  ? TOBRADEX ophthalmic solution 1 drop 4 (four) times daily.    ? ?No current facility-administered medications on file prior to visit.  ?  ?No Known Allergies ?Social History  ? ?Occupational History  ? Not on file  ?Tobacco Use  ? Smoking status: Never  ? Smokeless tobacco: Never  ?Vaping Use  ? Vaping Use: Never used  ?Substance and Sexual Activity  ? Alcohol use: Yes  ?  Alcohol/week: 7.0 standard drinks  ?  Types: 7 Cans of beer per week  ?  Comment: weekly  ? Drug use: No  ? Sexual activity: Not on file  ? ?Family History  ?Problem  Relation Age of Onset  ? Varicose Veins Mother   ? Stroke Mother   ? Glaucoma Mother   ? Kidney disease Mother   ? Hypertension Father   ? Heart attack Father   ? Cancer Brother   ?     testicular cancer  ? Hyperlipidemia Sister   ? Meniere's disease Sister   ? ?Immunization History  ?Administered Date(s) Administered  ? Fluad Quad(high Dose 65+) 03/25/2019, 05/14/2020  ? Influenza, High Dose Seasonal PF 04/27/2017, 04/30/2018  ? Influenza,inj,Quad PF,6+ Mos 05/14/2015, 06/03/2016  ? Influenza-Unspecified 05/26/2011, 08/15/2012, 05/01/2013  ? PFIZER Comirnaty(Gray Top)Covid-19 Tri-Sucrose Vaccine 08/22/2019, 09/12/2019, 05/19/2020  ? Pneumococcal Conjugate-13 08/13/2015  ? Pneumococcal Polysaccharide-23 03/12/2009  ? Tdap 02/07/2013  ? Zoster, Live 01/03/2016  ?  ? ?Review of Systems: Negative except as noted in the HPI.  ? ?Objective: ?There were no vitals filed for this visit. ? ?Justin [name] is a pleasant 81 y.o. male in NAD. AAO X 3. ? ?Vascular Examination: ?CFT <3 seconds b/l LE. Palpable DP pulse(s) b/l LE. Palpable PT pulse(s) b/l LE. Pedal hair sparse. No pain with calf compression b/l. Lower extremity skin temperature gradient within normal limits. Varicosities present b/l. No cyanosis or clubbing noted b/l LE. ? ?Dermatological Examination: ?Pedal integument with normal turgor, texture and tone BLE. No open wounds b/l LE. No interdigital macerations noted b/l LE. Toenails 1-5 b/l elongated, discolored, dystrophic, thickened, crumbly with subungual debris and tenderness to dorsal palpation. No hyperkeratotic nor porokeratotic lesions present on today's visit. ? ?Neurological Examination: ?Protective sensation intact 5/5 intact bilaterally with 10g monofilament b/l. Vibratory sensation decreased right lower extremity. Vibratory sensation diminished left lower extremity. Deep tendon reflexes normal b/l.  ? ?Musculoskeletal Examination: ?Muscle strength 5/5 to all lower extremity muscle groups bilaterally.  HAV with bunion deformity noted b/l LE. Hammertoe deformity noted 2-5 b/l. Utilizes cane for ambulation assistance. ? ?Footwear Assessment: ?Does the patient wear appropriate shoes? Yes. ?Does the patient need inserts/orthotics? No. ? ?Assessment: ?1. Pain due to onychomycosis of toenails of both feet   ?2. Type 2 diabetes mellitus with stage 2 chronic kidney disease, without long-term current use of insulin ([place])   ?3. Hallux valgus, acquired, bilateral   ?4. Acquired hammertoes of both feet   ?5. Encounter for diabetic foot exam ([place])   ?  ?ADA Risk Categorization: ?Low Risk :  ?Patient has all of the following: ?Intact protective sensation ?No prior foot ulcer  ?No severe deformity ?Pedal pulses present ? ?Plan: ?-Diabetic foot examination performed today. ?-Continue foot and shoe inspections daily. Monitor blood glucose per PCP/Endocrinologist's recommendations. ?-Mycotic toenails 1-5 bilaterally were debrided in length and girth with sterile nail nippers and dremel without incident. ?-Patient/POA to call should there be question/concern in the interim. ?Return in about 3 months (around 01/11/2022). ? ?  Marzetta Board, DPM ?

## 2021-10-21 DIAGNOSIS — M5137 Other intervertebral disc degeneration, lumbosacral region: Secondary | ICD-10-CM | POA: Diagnosis not present

## 2021-10-21 DIAGNOSIS — M9905 Segmental and somatic dysfunction of pelvic region: Secondary | ICD-10-CM | POA: Diagnosis not present

## 2021-10-21 DIAGNOSIS — M9902 Segmental and somatic dysfunction of thoracic region: Secondary | ICD-10-CM | POA: Diagnosis not present

## 2021-10-21 DIAGNOSIS — M5134 Other intervertebral disc degeneration, thoracic region: Secondary | ICD-10-CM | POA: Diagnosis not present

## 2021-10-21 DIAGNOSIS — M47817 Spondylosis without myelopathy or radiculopathy, lumbosacral region: Secondary | ICD-10-CM | POA: Diagnosis not present

## 2021-10-21 DIAGNOSIS — M9903 Segmental and somatic dysfunction of lumbar region: Secondary | ICD-10-CM | POA: Diagnosis not present

## 2021-11-22 DIAGNOSIS — M47817 Spondylosis without myelopathy or radiculopathy, lumbosacral region: Secondary | ICD-10-CM | POA: Diagnosis not present

## 2021-11-22 DIAGNOSIS — M5134 Other intervertebral disc degeneration, thoracic region: Secondary | ICD-10-CM | POA: Diagnosis not present

## 2021-11-22 DIAGNOSIS — M5137 Other intervertebral disc degeneration, lumbosacral region: Secondary | ICD-10-CM | POA: Diagnosis not present

## 2021-11-22 DIAGNOSIS — M9905 Segmental and somatic dysfunction of pelvic region: Secondary | ICD-10-CM | POA: Diagnosis not present

## 2021-11-22 DIAGNOSIS — M9902 Segmental and somatic dysfunction of thoracic region: Secondary | ICD-10-CM | POA: Diagnosis not present

## 2021-11-22 DIAGNOSIS — M9903 Segmental and somatic dysfunction of lumbar region: Secondary | ICD-10-CM | POA: Diagnosis not present

## 2021-11-23 DIAGNOSIS — E119 Type 2 diabetes mellitus without complications: Secondary | ICD-10-CM | POA: Diagnosis not present

## 2021-11-23 DIAGNOSIS — H0102B Squamous blepharitis left eye, upper and lower eyelids: Secondary | ICD-10-CM | POA: Diagnosis not present

## 2021-11-23 DIAGNOSIS — H02112 Cicatricial ectropion of right lower eyelid: Secondary | ICD-10-CM | POA: Diagnosis not present

## 2021-11-23 DIAGNOSIS — H5213 Myopia, bilateral: Secondary | ICD-10-CM | POA: Diagnosis not present

## 2021-11-23 DIAGNOSIS — H0102A Squamous blepharitis right eye, upper and lower eyelids: Secondary | ICD-10-CM | POA: Diagnosis not present

## 2021-11-23 DIAGNOSIS — H2513 Age-related nuclear cataract, bilateral: Secondary | ICD-10-CM | POA: Diagnosis not present

## 2021-11-23 LAB — HM DIABETES EYE EXAM

## 2021-11-30 DIAGNOSIS — E1122 Type 2 diabetes mellitus with diabetic chronic kidney disease: Secondary | ICD-10-CM | POA: Diagnosis not present

## 2021-11-30 DIAGNOSIS — E782 Mixed hyperlipidemia: Secondary | ICD-10-CM | POA: Diagnosis not present

## 2021-11-30 DIAGNOSIS — E1142 Type 2 diabetes mellitus with diabetic polyneuropathy: Secondary | ICD-10-CM | POA: Diagnosis not present

## 2021-12-01 ENCOUNTER — Encounter: Payer: Self-pay | Admitting: Internal Medicine

## 2021-12-01 ENCOUNTER — Other Ambulatory Visit: Payer: Medicare Other | Admitting: Internal Medicine

## 2021-12-01 VITALS — BP 128/58 | HR 63 | Temp 97.9°F | Resp 16

## 2021-12-01 DIAGNOSIS — Z7189 Other specified counseling: Secondary | ICD-10-CM | POA: Diagnosis not present

## 2021-12-01 DIAGNOSIS — H6123 Impacted cerumen, bilateral: Secondary | ICD-10-CM | POA: Diagnosis not present

## 2021-12-01 DIAGNOSIS — E1122 Type 2 diabetes mellitus with diabetic chronic kidney disease: Secondary | ICD-10-CM | POA: Diagnosis not present

## 2021-12-01 DIAGNOSIS — H0013 Chalazion right eye, unspecified eyelid: Secondary | ICD-10-CM

## 2021-12-01 DIAGNOSIS — I10 Essential (primary) hypertension: Secondary | ICD-10-CM

## 2021-12-01 DIAGNOSIS — E782 Mixed hyperlipidemia: Secondary | ICD-10-CM

## 2021-12-01 DIAGNOSIS — N182 Chronic kidney disease, stage 2 (mild): Secondary | ICD-10-CM

## 2021-12-01 MED ORDER — ONETOUCH ULTRA VI STRP
ORAL_STRIP | 10 refills | Status: DC
Start: 2021-12-01 — End: 2022-11-16

## 2021-12-01 MED ORDER — TAMSULOSIN HCL 0.4 MG PO CAPS: 0.4000 mg | ORAL_CAPSULE | Freq: Every day | ORAL | 3 refills | Status: DC

## 2021-12-01 MED ORDER — ROSUVASTATIN CALCIUM 20 MG PO TABS: 20.0000 mg | ORAL_TABLET | Freq: Every day | ORAL | 3 refills | Status: DC

## 2021-12-01 MED ORDER — TOBRADEX 0.3-0.1 % OP SUSP: 1.0000 [drp] | Freq: Four times a day (QID) | OPHTHALMIC | 1 refills | Status: DC

## 2021-12-01 MED ORDER — ONETOUCH DELICA PLUS LANCET33G MISC: 2 refills | Status: AC

## 2021-12-01 MED ORDER — SITAGLIPTIN PHOSPHATE 100 MG PO TABS: 100.0000 mg | ORAL_TABLET | Freq: Every day | ORAL | 3 refills | Status: DC

## 2021-12-01 MED ORDER — FINASTERIDE 5 MG PO TABS: 5.0000 mg | ORAL_TABLET | Freq: Every day | ORAL | 3 refills | Status: DC

## 2021-12-01 NOTE — Progress Notes (Signed)
Therapist, nutritional Palliative Care Follow-Up Visit Telephone: [phone]  Fax: [phone]   Date of encounter: 12/01/21 9:31 AM PATIENT NAME: Justin [name] [address] [place] [address]   [phone] (home)  DOB: 19-Sep-1940 MRN: [id number] PRIMARY CARE PROVIDER:    Kermit Balo, DO,  [address] [place] [address] [phone]    RESPONSIBLE PARTY:    Contact Information     Name Relation Home Work Mobile   Debeer,Patricia Sister [phone]     Levaughn, Puccinelli   [phone]        I met face to face with patient and family in his home.                                    ASSESSMENT AND PLAN / RECOMMENDATIONS:   Advance Care Planning/Goals of Care: Goals include to maximize quality of life and symptom management. Patient/health care surrogate gave his/her permission to discuss.Our advance care planning conversation included a discussion about:    The value and importance of advance care planning  Experiences with loved ones who have been seriously ill or have died  Exploration of personal, cultural or spiritual beliefs that might influence medical decisions  Exploration of goals of care in the event of a sudden injury or illness  Identification  of a healthcare agent--sister Curlene Dolphin Review and updating or creation of an  advance directive document . Decision not to resuscitate or to de-escalate disease focused treatments due to poor prognosis. CODE STATUS:  DNR, MOST completed today and uploaded  Symptom Management/Plan: 1. Acute chalazion of right eye - will renew drops as this has recurred for him and his sister will be away for 3 weeks in June--he can use if it comes back--generic is fine - TOBRADEX ophthalmic solution; Place 1 drop into the right eye 4 (four) times daily. When needed for infection  Dispense: 5 mL; Refill: 1  2. Type 2 diabetes mellitus with stage 2 chronic kidney disease, without  long-term current use of insulin (H[facility]) - minimal improvement of hba1c to 7 from 7.1--this is still pretty good considering he's inactive and eats potato chips daily -encouraged him to avoid those, but he says if he dies he dies -med renewals entered now also due to concerns that his sister will be out of town for 3 wks - metFORMIN (GLUCOPHAGE) 1000 MG tablet; Take 1 tablet (1,000 mg total) by mouth 2 (two) times daily with a meal.  Dispense: 180 tablet; Refill: 1 - sitaGLIPtin (JANUVIA) 100 MG tablet; Take 1 tablet (100 mg total) by mouth daily.  Dispense: 90 tablet; Refill: 3 - Lancets (ONETOUCH DELICA PLUS [id number]) MISC; USE UP TO 4 TIMES A DAY AS DIRECTED  Dispense: 100 each; Refill: 2  3. Mixed hyperlipidemia - at goal with crestor and he's tolerating it fine with normal liver testing - rosuvastatin (CRESTOR) 20 MG tablet; Take 1 tablet (20 mg total) by mouth daily.  Dispense: 90 tablet; Refill: 3  4. Essential hypertension, benign -bp at goal in fact low -encouraged hydration with water--has not been doing this and previously had syncopal episode amid dehydration--he does not seem to have the insight to do this independently  5. Bilateral impacted cerumen -both ears were loaded with cerumen -I was able to manually debride the right ear with a white plastic curette and the left required both lavage and manual  debridement with the curette--immediate relief and improved hearing was noted  6. ACP (advance care planning) -MOST completed with pt and his sister today and was uploaded to vynca--original to be kept on the refrigerator under normal circumstances but in the emergency folder while [name] is away (she made a list of contacts and his meds and insurance for this time)  Follow up Palliative Care Visit: Palliative care will continue to follow for complex medical decision making, advance care planning, and clarification of goals. Return 8 weeks for ears, then 16 weeks for f/u visit with  labs before.   This visit was coded based on medical decision making (MDM). 28 minutes spent on ACP.  PPS: 50%   Chief Complaint: Follow-up visit  HISTORY OF PRESENT ILLNESS:  Justin [name] is a 81 y.o. year old male  with DMII with peripheral neuropathy and CKD, htn, hyperlipidemia, recurrent cerumen impaction, prior colon cancer, low back pain who lives in his sister and brother-in-law's basement due to intellectual disability.   He did agree not to ride the electric bicycle he had purchased.  It cannot be returned.  His sister and BIL had said that they would not do pleasurable things for him only essentials if he did that b/c it was dangerous and would breach the contract they'd made with him.  So far, this was a success.  He no longer drives since his syncope in the shower and dizziness spells thereafter.  He has not had these lately, but is back to not hydrating and eating potato chips.  Educated him about this.  Reviewed labs with him and his sister.  Updated medication orders for them.  History obtained from review of EMR, discussion with primary team, and interview with family, facility staff/caregiver and/or Mr. Midkiff.  I reviewed available labs, medications, imaging, studies and related documents from the EMR.  Records reviewed and summarized above.   ROS  General: NAD EYES: denies vision changes ENMT: denies dysphagia Cardiovascular: denies chest pain, denies DOE Pulmonary: denies cough, denies increased SOB Abdomen: endorses good appetite, denies constipation, endorses continence of bowel GU: denies dysuria, endorses continence of urine MSK:  denies increased weakness,  no falls reported Skin: denies rashes or wounds Neurological: denies pain, denies insomnia, now has numbness of middle three digits of left hand newly--has no activities to create CTS but does have DMII Psych: Endorses positive mood Heme/lymph/immuno: denies bruises, abnormal bleeding  Physical  Exam: Current and past weights:  no recent--check next time Constitutional: NAD General: obese  EYES: anicteric sclera, lids intact, no discharge  ENMT: intact hearing, oral mucous membranes moist, dentition intact CV: S1S2, RRR, no LE edema Pulmonary: LCTA, no increased work of breathing, no cough, room air Abdomen: intake 100%, normo-active BS + 4 quadrants, soft and non tender, no ascites GU: deferred MSK: no sarcopenia, moves all extremities, ambulatory Skin: warm and dry, no rashes or wounds on visible skin Neuro:  no generalized weakness,  cognitive impairment,numbness of middle three digits of left hand Psych: non-anxious affect, A and O x 3 Hem/lymph/immuno: no widespread bruising  CURRENT PROBLEM LIST:  Patient Active Problem List   Diagnosis Date Noted   Dizziness 10/15/2019   Splenic artery aneurysm ([facility]) 06/10/2019   Bilateral impacted cerumen 06/10/2019   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   Seasonal allergies 03/25/2019   Mid back pain 03/25/2014   Tubular adenoma of colon 03/25/2014   CN (constipation) 03/25/2014   Type 2 diabetes mellitus with stage  2 chronic kidney disease, without long-term current use of insulin ([place]) 03/25/2014   Anemia, iron deficiency 03/25/2014   Essential hypertension, benign 03/25/2014   Hyperlipidemia 03/25/2014   Personal history of colon cancer, stage II 03/25/2014   Prostate nodule without urinary obstruction 03/25/2014   PAST MEDICAL HISTORY:  Active Ambulatory Problems    Diagnosis Date Noted   Mid back pain 03/25/2014   Tubular adenoma of colon 03/25/2014   CN (constipation) 03/25/2014   Type 2 diabetes mellitus with stage 2 chronic kidney disease, without long-term current use of insulin ([place]) 03/25/2014   Anemia, iron deficiency 03/25/2014   Essential hypertension, benign 03/25/2014   Hyperlipidemia 03/25/2014   Personal history of colon cancer, stage II 03/25/2014   Prostate nodule without urinary  obstruction 03/25/2014   Seasonal allergies 03/25/2019   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   Bilateral impacted cerumen 06/10/2019   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   Dizziness 10/15/2019   Resolved Ambulatory Problems    Diagnosis Date Noted   Colon cancer ([place]) 04/11/2014   Past Medical History:  Diagnosis Date   Arthritis    Chronic kidney disease    Colon polyps    Diabetes mellitus without complication ([place])    Eczema    Hypercholesteremia    Hypertension    SOCIAL HX:  Social History   Tobacco Use   Smoking status: Never   Smokeless tobacco: Never  Substance Use Topics   Alcohol use: Yes    Alcohol/week: 7.0 standard drinks    Types: 7 Cans of beer per week    Comment: weekly     ALLERGIES: No Known Allergies   PERTINENT MEDICATIONS:  Outpatient Encounter Medications as of 12/01/2021  Medication Sig   clotrimazole-betamethasone (LOTRISONE) cream Apply 1 application topically 2 (two) times daily.   diazepam (VALIUM) 5 MG tablet Take 1 tablet 40-60 minutes prior to MRI.  May repeat at MRI facility if needed.   diclofenac sodium (VOLTAREN) 1 % GEL Apply 4 g topically 4 (four) times daily. To lower back   docusate sodium (COLACE) 100 MG capsule Take 100 mg by mouth daily.   finasteride (PROSCAR) 5 MG tablet TAKE ONE TABLET BY MOUTH DAILY   glucose blood (ONETOUCH ULTRA) test strip USE TO CHECK BLOOD SUGARS ONCE DAILY AS DIRECTED FOR DIABETES   Lancets (ONETOUCH DELICA PLUS [id number]) MISC USE UP TO 4 TIMES A DAY AS DIRECTED   metFORMIN (GLUCOPHAGE) 1000 MG tablet TAKE ONE TABLET BY MOUTH DAILY WITH BREAKFAST   mometasone (ELOCON) 0.1 % ointment SMARTSIG:Sparingly Topical Twice Daily   PFIZER-BIONT COVID-19 VAC-TRIS SUSP injection    rosuvastatin (CRESTOR) 20 MG tablet Take 1 tablet (20 mg total) by mouth daily.   sitaGLIPtin (JANUVIA) 100 MG tablet Take 1 tablet (100 mg total) by mouth daily.   tamsulosin (FLOMAX) 0.4 MG CAPS capsule  Take 1 capsule (0.4 mg total) by mouth daily.   TOBRADEX ophthalmic solution 1 drop 4 (four) times daily.   No facility-administered encounter medications on file as of 12/01/2021.   Thank you for the opportunity to participate in the care of Mr. Misiaszek.  The palliative care team will continue to follow. Please call our office at [phone] if we can be of additional assistance.   Hollace Kinnier, DO  COVID-19 PATIENT SCREENING TOOL Asked and negative response unless otherwise noted:  Have you had symptoms of covid, tested positive or been in contact with someone with symptoms/positive test in the past 5-10  days? no

## 2021-12-20 DIAGNOSIS — M9903 Segmental and somatic dysfunction of lumbar region: Secondary | ICD-10-CM | POA: Diagnosis not present

## 2021-12-20 DIAGNOSIS — M5137 Other intervertebral disc degeneration, lumbosacral region: Secondary | ICD-10-CM | POA: Diagnosis not present

## 2021-12-20 DIAGNOSIS — M5134 Other intervertebral disc degeneration, thoracic region: Secondary | ICD-10-CM | POA: Diagnosis not present

## 2021-12-20 DIAGNOSIS — M47817 Spondylosis without myelopathy or radiculopathy, lumbosacral region: Secondary | ICD-10-CM | POA: Diagnosis not present

## 2021-12-20 DIAGNOSIS — M9905 Segmental and somatic dysfunction of pelvic region: Secondary | ICD-10-CM | POA: Diagnosis not present

## 2021-12-20 DIAGNOSIS — M9902 Segmental and somatic dysfunction of thoracic region: Secondary | ICD-10-CM | POA: Diagnosis not present

## 2022-01-11 ENCOUNTER — Encounter: Payer: Self-pay | Admitting: Podiatry

## 2022-01-11 ENCOUNTER — Ambulatory Visit: Payer: Medicare Other | Admitting: Podiatry

## 2022-01-11 DIAGNOSIS — M79674 Pain in right toe(s): Secondary | ICD-10-CM | POA: Diagnosis not present

## 2022-01-11 DIAGNOSIS — N182 Chronic kidney disease, stage 2 (mild): Secondary | ICD-10-CM | POA: Diagnosis not present

## 2022-01-11 DIAGNOSIS — B351 Tinea unguium: Secondary | ICD-10-CM | POA: Diagnosis not present

## 2022-01-11 DIAGNOSIS — M79675 Pain in left toe(s): Secondary | ICD-10-CM

## 2022-01-11 DIAGNOSIS — E1122 Type 2 diabetes mellitus with diabetic chronic kidney disease: Secondary | ICD-10-CM | POA: Diagnosis not present

## 2022-01-17 NOTE — Progress Notes (Signed)
  Subjective:  Patient ID: Justin [name], male    DOB: June 05, 1941,  MRN: [id number]  Justin [name] presents to clinic today for at risk foot care. Pt has h/o NIDDM with chronic kidney disease and painful thick toenails that are difficult to trim. Pain interferes with ambulation. Aggravating factors include wearing enclosed shoe gear. Pain is relieved with periodic professional debridement.  Patient states blood glucose was 248 mg/dl  last night .    Last known HgA1c was unknown.  Patient did not check blood glucose today.  New problem(s): None.   PCP is Gayland Curry, DO , and last visit was Dec 01, 2021.  No Known Allergies  Review of Systems: Negative except as noted in the HPI.  Objective: No changes noted in today's physical examination. There were no vitals filed for this visit.  Justin [name] is a pleasant 81 y.o. male in NAD. AAO X 3.  Vascular Examination: CFT <3 seconds b/l LE. Palpable DP pulse(s) b/l LE. Palpable PT pulse(s) b/l LE. Pedal hair sparse. No pain with calf compression b/l. Lower extremity skin temperature gradient within normal limits. Varicosities present b/l. No cyanosis or clubbing noted b/l LE.  Dermatological Examination: Pedal integument with normal turgor, texture and tone BLE. No open wounds b/l LE. No interdigital macerations noted b/l LE. Toenails 1-5 b/l elongated, discolored, dystrophic, thickened, crumbly with subungual debris and tenderness to dorsal palpation. No hyperkeratotic nor porokeratotic lesions present on today's visit.  Neurological Examination: Protective sensation intact 5/5 intact bilaterally with 10g monofilament b/l. Vibratory sensation decreased right lower extremity. Vibratory sensation diminished left lower extremity. Deep tendon reflexes normal b/l.   Musculoskeletal Examination: Muscle strength 5/5 to all lower extremity muscle groups bilaterally. HAV with bunion deformity noted b/l LE. Hammertoe deformity noted 2-5 b/l.  Utilizes cane for ambulation assistance.  Assessment/Plan: 1. 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 Type 2 diabetes mellitus with stage 2 chronic kidney disease, without long-term current use of insulin ([facility])     -Examined patient. -Stressed the importance of good glycemic control and the detriment of not  controlling glucose levels in relation to the foot. -Patient to continue soft, supportive shoe gear daily. -Toenails 1-5 b/l were debrided in length and girth with sterile nail nippers and dremel without iatrogenic bleeding.  -Patient/POA to call should there be question/concern in the interim.   Return in about 3 months (around 04/13/2022).  Marzetta Board, DPM

## 2022-01-24 DIAGNOSIS — M47817 Spondylosis without myelopathy or radiculopathy, lumbosacral region: Secondary | ICD-10-CM | POA: Diagnosis not present

## 2022-01-24 DIAGNOSIS — M9905 Segmental and somatic dysfunction of pelvic region: Secondary | ICD-10-CM | POA: Diagnosis not present

## 2022-01-24 DIAGNOSIS — M9903 Segmental and somatic dysfunction of lumbar region: Secondary | ICD-10-CM | POA: Diagnosis not present

## 2022-01-24 DIAGNOSIS — M9902 Segmental and somatic dysfunction of thoracic region: Secondary | ICD-10-CM | POA: Diagnosis not present

## 2022-01-24 DIAGNOSIS — M5134 Other intervertebral disc degeneration, thoracic region: Secondary | ICD-10-CM | POA: Diagnosis not present

## 2022-01-24 DIAGNOSIS — M5137 Other intervertebral disc degeneration, lumbosacral region: Secondary | ICD-10-CM | POA: Diagnosis not present

## 2022-02-15 ENCOUNTER — Other Ambulatory Visit: Payer: Medicare Other | Admitting: Internal Medicine

## 2022-02-15 DIAGNOSIS — H6123 Impacted cerumen, bilateral: Secondary | ICD-10-CM

## 2022-02-15 DIAGNOSIS — F819 Developmental disorder of scholastic skills, unspecified: Secondary | ICD-10-CM | POA: Diagnosis not present

## 2022-02-15 DIAGNOSIS — N182 Chronic kidney disease, stage 2 (mild): Secondary | ICD-10-CM | POA: Diagnosis not present

## 2022-02-15 DIAGNOSIS — E1122 Type 2 diabetes mellitus with diabetic chronic kidney disease: Secondary | ICD-10-CM | POA: Diagnosis not present

## 2022-02-16 NOTE — Progress Notes (Signed)
[facility] Pall[facility] Care Follow-Up Visit Telephone: [phone]  Fax: [phone]   Date of encounter: 02/16/22 3:27 PM PATIENT NAME: Justin [name] [address] [address]   [phone] (home)  DOB: August 28, 1940 MRN: [id number] PRIMARY CARE PROVIDER:    Gayland Curry, DO,  [place] [address] [phone]  REFERRING PROVIDER:   Gayland Curry, DO [place],  [place] [address] [phone]  RESPONSIBLE PARTY:    Contact Information     Name Relation Home Work Mobile   Debeer,Patricia Sister [phone]     Kairee, Isa   [phone]        I met face to face with patient and family in his home.  History and updates obtained from patient and his sister and POA, [place] Calix. Palliative Care was asked to follow this patient by consultation request of  Gayland Curry, DO to address advance care planning and complex medical decision making. This is follow-up visit.                                     ASSESSMENT AND PLAN / RECOMMENDATIONS:   Advance Care Planning/Goals of Care: Goals include to maximize quality of life and symptom management. Patient/health care surrogate gave his/her permission to discuss.Our advance care planning conversation included a discussion about:    The value and importance of advance care planning  Experiences with loved ones who have been seriously ill or have died  Exploration of personal, cultural or spiritual beliefs that might influence medical decisions  Exploration of goals of care in the event of a sudden injury or illness  Identification  of a healthcare agent --sister, Maricela Bo Review and updating or creation of an  advance directive document . Decision not to resuscitate or to de-escalate disease focused treatments due to poor prognosis. CODE STATUS:  DNR, MOST on file already  Symptom Management/Plan: 1. Type 2 diabetes mellitus with  stage 2 chronic kidney disease, without long-term current use of insulin (HCC) -eating habits on his own are not the best, prefers unhealthy snacks, his sister tries to prepare healthier foods for him for his main meals -f/u hba1c before next visit and urine microalbumin  -goes to podiatry regularly for foot exams  2. Intellectual delay -his sister and brother-in-law are now on a wait list for [facility] and cannot bring him there with them -they request assistance in getting him formally certified as having intellectual delay which his parents never did when he was a child despite how he was failing every year and the nuns felt sorry for him and passed him anyway -[name] and Param hope to get pt placed in a group home so he can receive adequate supervision -connected [name] with our social worker to look into this further  3. Bilateral impacted cerumen -pt c/o fullness and decreased hearing in both ears -they were both flushed with warm water and peroxide and then mechanically debrided with a plastic curette to remove large remaining pieces of cerumen--this provided relief    Follow up Palliative Care Visit: Palliative care will continue to follow for complex medical decision making, advance care planning, and clarification of goals. Return 04/19/2022 with fasting labs before  And prn.   This visit was coded based on medical decision making (MDM).  PPS: 50%  HOSPICE ELIGIBILITY/DIAGNOSIS: TBD  Chief Complaint: Follow-up  palliative visit  HISTORY OF PRESENT ILLNESS:  Justin [name] is a 81 y.o. year old male  with intellectual disability/delay, DMII with neuropathy, low back pain, prior colon cancer that was resected, past syncope amid dehydration, recurrent cerumen impaction that interferes with his hearing, among others seen for med mgt of chronic diseases.  Things went ok while his sister and BIL were away in [place] and a caregiver/friend stayed with him.  He has no new concerns  outside of his ears feeling full of wax.    [name] mentions a plan for she and her husband to eventually move to a [place] which will mean Yariel will need a new home, as well.     History obtained from review of EMR, discussion with primary team, and interview with family, facility staff/caregiver and/or Mr. Roes.  I reviewed available labs, medications, imaging, studies and related documents from the EMR.  Records reviewed and summarized above.   ROS Review of Systems  Constitutional:  Negative for activity change and appetite change.  HENT:  Negative for congestion.   Eyes:  Negative for visual disturbance.  Respiratory:  Negative for shortness of breath.   Cardiovascular:  Negative for leg swelling.  Gastrointestinal:  Negative for constipation.  Genitourinary:  Positive for frequency. Negative for dysuria.  Musculoskeletal:  Negative for gait problem.  Skin:  Negative for color change.  Neurological:  Positive for numbness. Negative for dizziness and weakness.  Hematological:  Bruises/bleeds easily.  Psychiatric/Behavioral:  Positive for agitation.     Physical Exam: There were no vitals filed for this visit. There is no height or weight on file to calculate BMI. Wt Readings from Last 500 Encounters:  03/17/21 190 lb 9.6 oz (86.5 kg)  01/11/21 187 lb 3.2 oz (84.9 kg)  12/04/20 183 lb 12.8 oz (83.4 kg)  12/03/20 186 lb 4.6 oz (84.5 kg)  11/27/20 186 lb 3.2 oz (84.5 kg)  09/28/20 185 lb 3.2 oz (84 kg)  09/02/20 189 lb 3.2 oz (85.8 kg)  07/07/20 182 lb 6.4 oz (82.7 kg)  05/14/20 181 lb (82.1 kg)  05/11/20 184 lb 3.2 oz (83.6 kg)  04/21/20 183 lb 4.8 oz (83.1 kg)  01/09/20 184 lb (83.5 kg)  12/23/19 181 lb 12.8 oz (82.5 kg)  09/09/19 202 lb 3.2 oz (91.7 kg)  08/09/19 201 lb 3.2 oz (91.3 kg)  06/10/19 202 lb 12.8 oz (92 kg)  05/28/19 203 lb 6.4 oz (92.3 kg)  05/13/19 197 lb 3.2 oz (89.4 kg)  03/25/19 199 lb 12.8 oz (90.6 kg)  02/28/19 198 lb (89.8 kg)  02/04/19 199 lb  (90.3 kg)  09/27/18 199 lb (90.3 kg)  09/19/18 199 lb (90.3 kg)  09/14/18 200 lb (90.7 kg)  09/12/18 190 lb (86.2 kg)  08/14/18 189 lb 9.6 oz (86 kg)  05/24/18 206 lb (93.4 kg)  05/22/18 204 lb (92.5 kg)  04/30/18 205 lb 3.2 oz (93.1 kg)  01/08/18 204 lb (92.5 kg)  11/23/17 201 lb 9.6 oz (91.4 kg)  08/28/17 201 lb (91.2 kg)  07/27/17 205 lb (93 kg)  04/27/17 202 lb (91.6 kg)  01/05/17 198 lb (89.8 kg)  10/03/16 197 lb (89.4 kg)  09/19/16 199 lb (90.3 kg)  09/19/16 199 lb 3.2 oz (90.4 kg)  08/11/16 198 lb (89.8 kg)  06/03/16 195 lb (88.5 kg)  03/03/16 191 lb (86.6 kg)  01/05/16 192 lb 12.8 oz (87.5 kg)  11/12/15 197 lb (89.4 kg)  10/05/15 191 lb (86.6 kg)  09/24/15  194 lb 12.8 oz (88.4 kg)  08/13/15 194 lb (88 kg)  06/18/15 195 lb (88.5 kg)  05/14/15 193 lb 12.8 oz (87.9 kg)  02/09/15 189 lb (85.7 kg)  10/13/14 189 lb 9.6 oz (86 kg)  09/29/14 196 lb (88.9 kg)  07/21/14 190 lb (86.2 kg)  05/30/14 192 lb (87.1 kg)  04/10/14 186 lb (84.4 kg)  03/25/14 189 lb 3.2 oz (85.8 kg)   Physical Exam Constitutional:      Appearance: Normal appearance. He is obese.  HENT:     Head: Normocephalic and atraumatic.     Right Ear: There is impacted cerumen.     Left Ear: There is impacted cerumen.  Cardiovascular:     Rate and Rhythm: Normal rate and regular rhythm.  Pulmonary:     Effort: Pulmonary effort is normal.     Breath sounds: Normal breath sounds.  Abdominal:     General: Bowel sounds are normal.  Musculoskeletal:        General: Normal range of motion.     Right lower leg: No edema.     Left lower leg: No edema.  Neurological:     General: No focal deficit present.     Mental Status: He is alert and oriented to person, place, and time.     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   Bilateral impacted cerumen 06/10/2019   Sensorineural hearing loss (SNHL) of both  ears 06/10/2019   Seasonal allergies 03/25/2019   Mid back pain 03/25/2014   Tubular adenoma of colon 03/25/2014   CN (constipation) 03/25/2014   Type 2 diabetes mellitus with stage 2 chronic kidney disease, without long-term current use of insulin ([place]) 03/25/2014   Anemia, iron deficiency 03/25/2014   Essential hypertension, benign 03/25/2014   Hyperlipidemia 03/25/2014   Personal history of colon cancer, stage II 03/25/2014   Prostate nodule without urinary obstruction 03/25/2014    PAST MEDICAL HISTORY:  Active Ambulatory Problems    Diagnosis Date Noted   Mid back pain 03/25/2014   Tubular adenoma of colon 03/25/2014   CN (constipation) 03/25/2014   Type 2 diabetes mellitus with stage 2 chronic kidney disease, without long-term current use of insulin ([place]) 03/25/2014   Anemia, iron deficiency 03/25/2014   Essential hypertension, benign 03/25/2014   Hyperlipidemia 03/25/2014   Personal history of colon cancer, stage II 03/25/2014   Prostate nodule without urinary obstruction 03/25/2014   Seasonal allergies 03/25/2019   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   Bilateral impacted cerumen 06/10/2019   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   Dizziness 10/15/2019   Resolved Ambulatory Problems    Diagnosis Date Noted   Colon cancer ([place]) 04/11/2014   Past Medical History:  Diagnosis Date   Arthritis    Chronic kidney disease    Colon polyps    Diabetes mellitus without complication ([place])    Eczema    Hypercholesteremia    Hypertension     SOCIAL HX:  Social History   Tobacco Use   Smoking status: Never   Smokeless tobacco: Never  Substance Use Topics   Alcohol use: Yes    Alcohol/week: 7.0 standard drinks of alcohol    Types: 7 Cans of beer per week    Comment: weekly     ALLERGIES: No Known Allergies    PERTINENT MEDICATIONS:  Outpatient Encounter Medications as of 02/15/2022  Medication Sig   clotrimazole-betamethasone (LOTRISONE)  cream Apply 1 application topically 2 (two) times daily.   diclofenac sodium (VOLTAREN) 1 % GEL Apply 4 g topically 4 (four) times daily. To lower back   docusate sodium (COLACE) 100 MG capsule Take 100 mg by mouth daily.   finasteride (PROSCAR) 5 MG tablet Take 1 tablet (5 mg total) by mouth daily.   glucose blood (ONETOUCH ULTRA) test strip USE TO CHECK BLOOD SUGARS ONCE DAILY AS DIRECTED FOR DIABETES   Lancets (ONETOUCH DELICA PLUS [id number]) MISC USE UP TO 4 TIMES A DAY AS DIRECTED   metFORMIN (GLUCOPHAGE) 1000 MG tablet Take 1 tablet (1,000 mg total) by mouth 2 (two) times daily with a meal.   mometasone (ELOCON) 0.1 % ointment SMARTSIG:Sparingly Topical Twice Daily   rosuvastatin (CRESTOR) 20 MG tablet Take 1 tablet (20 mg total) by mouth daily.   sitaGLIPtin (JANUVIA) 100 MG tablet Take 1 tablet (100 mg total) by mouth daily.   tamsulosin (FLOMAX) 0.4 MG CAPS capsule Take 1 capsule (0.4 mg total) by mouth daily.   TOBRADEX ophthalmic solution Place 1 drop into the right eye 4 (four) times daily. When needed for infection   No facility-administered encounter medications on file as of 02/15/2022.    Thank you for the opportunity to participate in the care of Mr. Nepomuceno.  The palliative care team will continue to follow. Please call our office at [phone] if we can be of additional assistance.   Hollace Kinnier, DO  COVID-19 PATIENT SCREENING TOOL Asked and negative response unless otherwise noted:  Have you had symptoms of covid, tested positive or been in contact with someone with symptoms/positive test in the past 5-10 days? No

## 2022-02-18 ENCOUNTER — Encounter: Payer: Self-pay | Admitting: Internal Medicine

## 2022-02-25 ENCOUNTER — Telehealth: Payer: Self-pay

## 2022-02-25 DIAGNOSIS — Z515 Encounter for palliative care: Secondary | ICD-10-CM

## 2022-02-25 NOTE — Telephone Encounter (Signed)
(  3:40 pm) SW left a message for patient's sister-Patricia. SW requested a return call.

## 2022-02-28 ENCOUNTER — Telehealth: Payer: Self-pay

## 2022-02-28 NOTE — Telephone Encounter (Signed)
(  1:30 pm) SW left a message for patient's sister-Patricia requesting a  call back to provide support and resources to her.

## 2022-04-14 DIAGNOSIS — Z86010 Personal history of colonic polyps: Secondary | ICD-10-CM | POA: Diagnosis not present

## 2022-04-14 DIAGNOSIS — E1122 Type 2 diabetes mellitus with diabetic chronic kidney disease: Secondary | ICD-10-CM | POA: Diagnosis not present

## 2022-04-19 ENCOUNTER — Encounter: Payer: Self-pay | Admitting: Internal Medicine

## 2022-04-19 ENCOUNTER — Other Ambulatory Visit: Payer: Medicare Other | Admitting: Internal Medicine

## 2022-04-19 VITALS — BP 116/72 | HR 66 | Temp 97.5°F | Resp 18 | Wt 186.5 lb

## 2022-04-19 DIAGNOSIS — E1122 Type 2 diabetes mellitus with diabetic chronic kidney disease: Secondary | ICD-10-CM

## 2022-04-19 DIAGNOSIS — H6123 Impacted cerumen, bilateral: Secondary | ICD-10-CM

## 2022-04-19 DIAGNOSIS — I10 Essential (primary) hypertension: Secondary | ICD-10-CM

## 2022-04-19 DIAGNOSIS — F819 Developmental disorder of scholastic skills, unspecified: Secondary | ICD-10-CM

## 2022-04-19 DIAGNOSIS — Z0000 Encounter for general adult medical examination without abnormal findings: Secondary | ICD-10-CM

## 2022-04-19 DIAGNOSIS — E782 Mixed hyperlipidemia: Secondary | ICD-10-CM

## 2022-04-19 DIAGNOSIS — N182 Chronic kidney disease, stage 2 (mild): Secondary | ICD-10-CM | POA: Diagnosis not present

## 2022-04-19 NOTE — Progress Notes (Signed)
[facility]  Follow-Up Visit Telephone: [phone]  Fax: [phone]   Date of encounter: 04/19/22 2:39 PM PATIENT NAME: Justin [name] [address] [place] [address]   [phone] (home)  DOB: 01-27-1941 MRN: [id number] PRIMARY CARE PROVIDER:    Gayland Curry, DO,  [place] [address] [phone]   RESPONSIBLE PARTY:    Contact Information     Name Relation Home Work Mobile   Justin [name] Sister [phone]     Justin [name]   [phone]        I met face to face with patient and family in his home along with his sister who helped with history.                                   ASSESSMENT AND PLAN / RECOMMENDATIONS:   Advance Care Planning/Goals of Care: Goals include to maximize quality of life and symptom management. Patient/health care surrogate gave his/her permission to discuss.Our advance care planning conversation included a discussion about:    The value and importance of advance care planning  Experiences with loved ones who have been seriously ill or have died  Exploration of personal, cultural or spiritual beliefs that might influence medical decisions  Exploration of goals of care in the event of a sudden injury or illness  Identification  of a healthcare agent  Review and updating or creation of an  advance directive document . Decision not to resuscitate or to de-escalate disease focused treatments due to poor prognosis. CODE STATUS:  DNR< MOST on file  Symptom Management/Plan: 1. Type 2 diabetes mellitus with stage 2 chronic kidney disease, without long-term current use of insulin (HCC)  Decrease chips, try popcorn instead Hba1c up to 7.1, 7 in May, has been trending up Continue metformin 1042m po bid with meals and januvia 1041mdaily Has care touch lancet device, but has not been changing the lancets in it--Justin [name] is going to change for him monthly  2. Intellectual  delay Will arrange for proper assessment to allow him to be placed in an appropriate home setting  3. Bilateral impacted cerumen Ears were flushed with warm water and peroxide via ear lavage, right also required mechanical debridement with a white plastic curette  4. Mixed hyperlipidemia Continue current regimen, cut back on high fat snacks as above  5. Essential hypertension, benign BP at goal with current regimen, cont same and monitor  6. Routine health maintenance No bowel changes, no melena or hematochezia in view of prior colon cancer--shared decision previously made and reviewed again with Justin Larochebout no longer performing colonoscopies or other GI screenings for Justin [name] due to challenges with prep, and inability to reasonably tolerate treatments and recovery should he require a surgery or chemotherapy  Return 06/21/2022 for ear lavage, then again at 4 mos and prn.   This visit was coded based on medical decision making (MDM).   Chief Complaint: Follow-up palliative visit  HISTORY OF PRESENT ILLNESS:  Justin MODERs a [age].o. year old male  with DMII, h/o colon cancer, BPH with LUTS, obesity, htn, hyperlipidemia and intellectual disability seen for med mgt of chronic diseases.  His sister and BIL plan to move to a retirement community so they would like to have Justin [name] set up in a proper setting to get the best care.   He has been eating too many potato chips  in the afternoons.    His only complaint is his right ear as usual.  History obtained from review of EMR, discussion with primary team, and interview with family, facility staff/caregiver and/or Justin [name].  I reviewed available labs, medications, imaging, studies and related documents from the EMR.  Records reviewed and summarized above.   ROS Review of Systems  HENT:  Positive for ear pain and hearing loss.   All other systems reviewed and are negative.   Physical Exam: Vitals:   04/19/22 1007  BP: 116/72  Pulse:  66  Resp: 18  Temp: (!) 97.5 F (36.4 C)  SpO2: 95%  Weight: 186 lb 8 oz (84.6 kg)   Body mass index is 29.21 kg/m. Wt Readings from Last 500 Encounters:  04/19/22 186 lb 8 oz (84.6 kg)  03/17/21 190 lb 9.6 oz (86.5 kg)  01/11/21 187 lb 3.2 oz (84.9 kg)  12/04/20 183 lb 12.8 oz (83.4 kg)  12/03/20 186 lb 4.6 oz (84.5 kg)  11/27/20 186 lb 3.2 oz (84.5 kg)  09/28/20 185 lb 3.2 oz (84 kg)  09/02/20 189 lb 3.2 oz (85.8 kg)  07/07/20 182 lb 6.4 oz (82.7 kg)  05/14/20 181 lb (82.1 kg)  05/11/20 184 lb 3.2 oz (83.6 kg)  04/21/20 183 lb 4.8 oz (83.1 kg)  01/09/20 184 lb (83.5 kg)  12/23/19 181 lb 12.8 oz (82.5 kg)  09/09/19 202 lb 3.2 oz (91.7 kg)  08/09/19 201 lb 3.2 oz (91.3 kg)  06/10/19 202 lb 12.8 oz (92 kg)  05/28/19 203 lb 6.4 oz (92.3 kg)  05/13/19 197 lb 3.2 oz (89.4 kg)  03/25/19 199 lb 12.8 oz (90.6 kg)  02/28/19 198 lb (89.8 kg)  02/04/19 199 lb (90.3 kg)  09/27/18 199 lb (90.3 kg)  09/19/18 199 lb (90.3 kg)  09/14/18 200 lb (90.7 kg)  09/12/18 190 lb (86.2 kg)  08/14/18 189 lb 9.6 oz (86 kg)  05/24/18 206 lb (93.4 kg)  05/22/18 204 lb (92.5 kg)  04/30/18 205 lb 3.2 oz (93.1 kg)  01/08/18 204 lb (92.5 kg)  11/23/17 201 lb 9.6 oz (91.4 kg)  08/28/17 201 lb (91.2 kg)  07/27/17 205 lb (93 kg)  04/27/17 202 lb (91.6 kg)  01/05/17 198 lb (89.8 kg)  10/03/16 197 lb (89.4 kg)  09/19/16 199 lb (90.3 kg)  09/19/16 199 lb 3.2 oz (90.4 kg)  08/11/16 198 lb (89.8 kg)  06/03/16 195 lb (88.5 kg)  03/03/16 191 lb (86.6 kg)  01/05/16 192 lb 12.8 oz (87.5 kg)  11/12/15 197 lb (89.4 kg)  10/05/15 191 lb (86.6 kg)  09/24/15 194 lb 12.8 oz (88.4 kg)  08/13/15 194 lb (88 kg)  06/18/15 195 lb (88.5 kg)  05/14/15 193 lb 12.8 oz (87.9 kg)  02/09/15 189 lb (85.7 kg)  10/13/14 189 lb 9.6 oz (86 kg)  09/29/14 196 lb (88.9 kg)  07/21/14 190 lb (86.2 kg)  05/30/14 192 lb (87.1 kg)  04/10/14 186 lb (84.4 kg)  03/25/14 189 lb 3.2 oz (85.8 kg)   Physical Exam Constitutional:       Appearance: Normal appearance. He is obese.  HENT:     Head: Normocephalic and atraumatic.     Right Ear: There is impacted cerumen.     Left Ear: There is impacted cerumen.     Nose: Nose normal.     Mouth/Throat:     Pharynx: Oropharynx is clear.  Eyes:     Conjunctiva/sclera: Conjunctivae normal.  Cardiovascular:  Rate and Rhythm: Normal rate and regular rhythm.  Pulmonary:     Effort: Pulmonary effort is normal.     Breath sounds: Normal breath sounds.  Abdominal:     General: Bowel sounds are normal. There is no distension.     Palpations: Abdomen is soft.  Musculoskeletal:        General: Normal range of motion.     Right lower leg: No edema.     Left lower leg: No edema.  Skin:    General: Skin is warm and dry.  Neurological:     General: No focal deficit present.     Mental Status: He is alert and oriented to person, place, and time.  Psychiatric:        Mood and Affect: Mood normal.     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   Bilateral impacted cerumen 06/10/2019   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   Seasonal allergies 03/25/2019   Mid back pain 03/25/2014   Tubular adenoma of colon 03/25/2014   CN (constipation) 03/25/2014   Type 2 diabetes mellitus with stage 2 chronic kidney disease, without long-term current use of insulin ([place]) 03/25/2014   Anemia, iron deficiency 03/25/2014   Essential hypertension, benign 03/25/2014   Hyperlipidemia 03/25/2014   Personal history of colon cancer, stage II 03/25/2014   Prostate nodule without urinary obstruction 03/25/2014    PAST MEDICAL HISTORY:  Active Ambulatory Problems    Diagnosis Date Noted   Mid back pain 03/25/2014   Tubular adenoma of colon 03/25/2014   CN (constipation) 03/25/2014   Type 2 diabetes mellitus with stage 2 chronic kidney disease, without long-term current  use of insulin ([place]) 03/25/2014   Anemia, iron deficiency 03/25/2014   Essential hypertension, benign 03/25/2014   Hyperlipidemia 03/25/2014   Personal history of colon cancer, stage II 03/25/2014   Prostate nodule without urinary obstruction 03/25/2014   Seasonal allergies 03/25/2019   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   Bilateral impacted cerumen 06/10/2019   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   Dizziness 10/15/2019   Resolved Ambulatory Problems    Diagnosis Date Noted   Colon cancer ([place]) 04/11/2014   Past Medical History:  Diagnosis Date   Arthritis    Chronic kidney disease    Colon polyps    Diabetes mellitus without complication ([place])    Eczema    Hypercholesteremia    Hypertension     SOCIAL HX:  Social History   Tobacco Use   Smoking status: Never   Smokeless tobacco: Never  Substance Use Topics   Alcohol use: Yes    Alcohol/week: 7.0 standard drinks of alcohol    Types: 7 Cans of beer per week    Comment: weekly     ALLERGIES: No Known Allergies    PERTINENT MEDICATIONS:  Outpatient Encounter Medications as of 04/19/2022  Medication Sig   clotrimazole-betamethasone (LOTRISONE) cream Apply 1 application topically 2 (two) times daily.   diclofenac sodium (VOLTAREN) 1 % GEL Apply 4 g topically 4 (four) times daily. To lower back   docusate sodium (COLACE) 100 MG capsule Take 100 mg by mouth daily.   finasteride (PROSCAR) 5 MG tablet Take 1 tablet (5 mg total) by mouth daily.   glucose blood (ONETOUCH ULTRA) test strip USE TO CHECK BLOOD SUGARS ONCE DAILY AS DIRECTED FOR DIABETES   Lancets (ONETOUCH  DELICA PLUS [id number]) MISC USE UP TO 4 TIMES A DAY AS DIRECTED   metFORMIN (GLUCOPHAGE) 1000 MG tablet Take 1 tablet (1,000 mg total) by mouth 2 (two) times daily with a meal.   mometasone (ELOCON) 0.1 % ointment SMARTSIG:Sparingly Topical Twice Daily   rosuvastatin (CRESTOR) 20 MG tablet Take 1 tablet (20 mg total) by mouth daily.    sitaGLIPtin (JANUVIA) 100 MG tablet Take 1 tablet (100 mg total) by mouth daily.   tamsulosin (FLOMAX) 0.4 MG CAPS capsule Take 1 capsule (0.4 mg total) by mouth daily.   TOBRADEX ophthalmic solution Place 1 drop into the right eye 4 (four) times daily. When needed for infection   No facility-administered encounter medications on file as of 04/19/2022.    Thank you for the opportunity to participate in the care of Mr. Normington.  The palliative care team will continue to follow. Please call our office at [phone] if we can be of additional assistance.   Hollace Kinnier, DO  COVID-19 PATIENT SCREENING TOOL Asked and negative response unless otherwise noted:  Have you had symptoms of covid, tested positive or been in contact with someone with symptoms/positive test in the past 5-10 days? No

## 2022-04-20 DIAGNOSIS — L821 Other seborrheic keratosis: Secondary | ICD-10-CM | POA: Diagnosis not present

## 2022-04-20 DIAGNOSIS — D485 Neoplasm of uncertain behavior of skin: Secondary | ICD-10-CM | POA: Diagnosis not present

## 2022-04-20 DIAGNOSIS — D225 Melanocytic nevi of trunk: Secondary | ICD-10-CM | POA: Diagnosis not present

## 2022-05-17 ENCOUNTER — Encounter: Payer: Self-pay | Admitting: Podiatry

## 2022-05-17 ENCOUNTER — Ambulatory Visit (INDEPENDENT_AMBULATORY_CARE_PROVIDER_SITE_OTHER): Payer: Medicare Other | Admitting: Podiatry

## 2022-05-17 DIAGNOSIS — N182 Chronic kidney disease, stage 2 (mild): Secondary | ICD-10-CM

## 2022-05-17 DIAGNOSIS — M79674 Pain in right toe(s): Secondary | ICD-10-CM

## 2022-05-17 DIAGNOSIS — M79675 Pain in left toe(s): Secondary | ICD-10-CM | POA: Diagnosis not present

## 2022-05-17 DIAGNOSIS — E1122 Type 2 diabetes mellitus with diabetic chronic kidney disease: Secondary | ICD-10-CM | POA: Diagnosis not present

## 2022-05-17 DIAGNOSIS — B351 Tinea unguium: Secondary | ICD-10-CM

## 2022-05-17 NOTE — Progress Notes (Signed)
  Subjective:  Patient ID: Justin [name], male    DOB: 1940/10/15,  MRN: [id number]  Justin [name] presents to clinic today for:  Chief Complaint  Patient presents with   Nail Problem    Diabetic foot care [id number] A1C-Do not know PCP-Tiffany Reed PCP VST-5 Weeks ago   New problem(s): None.   PCP is Gayland Curry, DO , and last visit was  Dec 01, 2021.  No Known Allergies  Review of Systems: Negative except as noted in the HPI.  Objective: No changes noted in today's physical examination.  Justin [name] is a pleasant 81 y.o. male in NAD. AAO x 3.  Vascular Examination: CFT <3 seconds b/l LE. Palpable DP pulse(s) b/l LE. Palpable PT pulse(s) b/l LE. Pedal hair sparse. No pain with calf compression b/l. Lower extremity skin temperature gradient within normal limits. Varicosities present b/l. No cyanosis or clubbing noted b/l LE.  Dermatological Examination: Pedal integument with normal turgor, texture and tone BLE. No open wounds b/l LE. No interdigital macerations noted b/l LE. Toenails 1-5 b/l elongated, discolored, dystrophic, thickened, crumbly with subungual debris and tenderness to dorsal palpation. No hyperkeratotic nor porokeratotic lesions present on today's visit.  Neurological Examination: Protective sensation intact 5/5 intact bilaterally with 10g monofilament b/l. Vibratory sensation decreased right lower extremity. Vibratory sensation diminished left lower extremity. Deep tendon reflexes normal b/l.   Musculoskeletal Examination: Muscle strength 5/5 to all lower extremity muscle groups bilaterally. HAV with bunion deformity noted b/l LE. Hammertoe deformity noted 2-5 b/l. Utilizes cane for ambulation assistance.  Assessment/Plan: 1. 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 Type 2 diabetes mellitus with stage 2 chronic kidney disease, without long-term current use of insulin ([place])     No orders of the defined types were placed in this encounter.    -Patient was evaluated and treated. All patient's and/or POA's questions/concerns answered on today's visit. -Continue foot and shoe inspections daily. Monitor blood glucose per PCP/Endocrinologist's recommendations. -Patient to continue soft, supportive shoe gear daily. -Mycotic toenails 1-5 bilaterally were debrided in length and girth with sterile nail nippers and dremel without incident. -Patient/POA to call should there be question/concern in the interim.   Return in about 3 months (around 08/17/2022).  Marzetta Board, DPM

## 2022-06-21 ENCOUNTER — Other Ambulatory Visit: Payer: Medicare Other | Admitting: Internal Medicine

## 2022-08-16 ENCOUNTER — Other Ambulatory Visit: Payer: Medicare Other | Admitting: Internal Medicine

## 2022-08-24 DIAGNOSIS — D225 Melanocytic nevi of trunk: Secondary | ICD-10-CM | POA: Diagnosis not present

## 2022-08-24 DIAGNOSIS — L821 Other seborrheic keratosis: Secondary | ICD-10-CM | POA: Diagnosis not present

## 2022-08-24 DIAGNOSIS — L308 Other specified dermatitis: Secondary | ICD-10-CM | POA: Diagnosis not present

## 2022-08-30 ENCOUNTER — Ambulatory Visit (INDEPENDENT_AMBULATORY_CARE_PROVIDER_SITE_OTHER): Payer: Medicare Other | Admitting: Emergency Medicine

## 2022-08-30 ENCOUNTER — Encounter: Payer: Self-pay | Admitting: Emergency Medicine

## 2022-08-30 VITALS — BP 122/82 | HR 69 | Temp 97.9°F | Ht 66.0 in | Wt 181.5 lb

## 2022-08-30 DIAGNOSIS — I10 Essential (primary) hypertension: Secondary | ICD-10-CM

## 2022-08-30 DIAGNOSIS — E1169 Type 2 diabetes mellitus with other specified complication: Secondary | ICD-10-CM

## 2022-08-30 DIAGNOSIS — E1122 Type 2 diabetes mellitus with diabetic chronic kidney disease: Secondary | ICD-10-CM

## 2022-08-30 DIAGNOSIS — Z7689 Persons encountering health services in other specified circumstances: Secondary | ICD-10-CM

## 2022-08-30 DIAGNOSIS — N1832 Chronic kidney disease, stage 3b: Secondary | ICD-10-CM

## 2022-08-30 DIAGNOSIS — E785 Hyperlipidemia, unspecified: Secondary | ICD-10-CM

## 2022-08-30 DIAGNOSIS — N182 Chronic kidney disease, stage 2 (mild): Secondary | ICD-10-CM

## 2022-08-30 DIAGNOSIS — I152 Hypertension secondary to endocrine disorders: Secondary | ICD-10-CM | POA: Insufficient documentation

## 2022-08-30 DIAGNOSIS — E1159 Type 2 diabetes mellitus with other circulatory complications: Secondary | ICD-10-CM

## 2022-08-30 DIAGNOSIS — H6123 Impacted cerumen, bilateral: Secondary | ICD-10-CM

## 2022-08-30 LAB — COMPREHENSIVE METABOLIC PANEL
ALT: 10 U/L (ref 0–53)
AST: 15 U/L (ref 0–37)
Albumin: 4.1 g/dL (ref 3.5–5.2)
Alkaline Phosphatase: 53 U/L (ref 39–117)
BUN: 23 mg/dL (ref 6–23)
CO2: 31 mEq/L (ref 19–32)
Calcium: 9.3 mg/dL (ref 8.4–10.5)
Chloride: 102 mEq/L (ref 96–112)
Creatinine, Ser: 1.25 mg/dL (ref 0.40–1.50)
GFR: 53.75 mL/min — ABNORMAL LOW (ref >60.00)
Glucose, Bld: 103 mg/dL — ABNORMAL HIGH (ref 70–99)
Potassium: 4.1 mEq/L (ref 3.5–5.1)
Sodium: 140 mEq/L (ref 135–145)
Total Bilirubin: 0.6 mg/dL (ref 0.2–1.2)
Total Protein: 7.1 g/dL (ref 6.0–8.3)

## 2022-08-30 LAB — CBC WITH DIFFERENTIAL/PLATELET
Basophils Absolute: 0 10*3/uL (ref 0.0–0.1)
Basophils Relative: 0.4 % (ref 0.0–3.0)
Eosinophils Absolute: 0.1 10*3/uL (ref 0.0–0.7)
Eosinophils Relative: 1.5 % (ref 0.0–5.0)
HCT: 37.7 % — ABNORMAL LOW (ref 39.0–52.0)
Hemoglobin: 12.8 g/dL — ABNORMAL LOW (ref 13.0–17.0)
Lymphocytes Relative: 21.6 % (ref 12.0–46.0)
Lymphs Abs: 1.6 10*3/uL (ref 0.7–4.0)
MCHC: 33.9 g/dL (ref 30.0–36.0)
MCV: 85.2 fl (ref 78.0–100.0)
Monocytes Absolute: 0.7 10*3/uL (ref 0.1–1.0)
Monocytes Relative: 9.6 % (ref 3.0–12.0)
Neutro Abs: 4.9 10*3/uL (ref 1.4–7.7)
Neutrophils Relative %: 66.9 % (ref 43.0–77.0)
Platelets: 183 10*3/uL (ref 150.0–400.0)
RBC: 4.42 Mil/uL (ref 4.22–5.81)
RDW: 14 % (ref 11.5–15.5)
WBC: 7.3 10*3/uL (ref 4.0–10.5)

## 2022-08-30 LAB — LIPID PANEL
Cholesterol: 112 mg/dL (ref 0–200)
HDL: 44.7 mg/dL (ref >39.00)
LDL Cholesterol: 42 mg/dL (ref 0–99)
NonHDL: 67.09
Total CHOL/HDL Ratio: 3
Triglycerides: 123 mg/dL (ref 0.0–149.0)
VLDL: 24.6 mg/dL (ref 0.0–40.0)

## 2022-08-30 LAB — HEMOGLOBIN A1C: Hgb A1c MFr Bld: 6.8 % — ABNORMAL HIGH (ref 4.6–6.5)

## 2022-08-30 NOTE — Assessment & Plan Note (Signed)
Stable.  Diet and nutrition discussed. Lipid profile done today. Continue rosuvastatin 20 mg daily.

## 2022-08-30 NOTE — Progress Notes (Signed)
PRE-PROCEDURE EXAM:  Bilateral TM cannot be visualized due to total occlusion/impaction of the ear canal.  PROCEDURE INDICATION: remove wax to visualize ear drum & relieve discomfort  CONSENT:  Verbal     PROCEDURE NOTE:     Bilateral EAR:  I used warm water irrigation under direct visualization with the otoscope to free the wax bolus from the ear canal.    POST- PROCEDURE EXAM:  Bilateral TMs successfully visualized and found to have no erythema     The patient tolerated the procedure well.

## 2022-08-30 NOTE — Assessment & Plan Note (Signed)
Bilateral impaction affecting hearing. Ear lavage done

## 2022-08-30 NOTE — Progress Notes (Signed)
Justin [name] 82 y.o.   Chief Complaint  Patient presents with   New Patient (Initial Visit)    Ears clogged, no concerns , medication management, unable to go over med list, unsure of what medication patient is taking     HISTORY OF PRESENT ILLNESS: This is a 82 y.o. male first visit to this office, here to establish care with me. Accompanied by sister. Patient has multiple medical problems including hypertension, diabetes, chronic kidney disease, and dyslipidemia Not very familiar with medication list.  On multiple medications Lives with sister. 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 No other complaints or medical concerns today.  HPI   Prior to Admission medications   Medication Sig Start Date End Date Taking? Authorizing Provider  clotrimazole-betamethasone (LOTRISONE) cream Apply 1 application topically 2 (two) times daily. 01/09/20  Yes Reed, Tiffany L, DO  docusate sodium (COLACE) 100 MG capsule Take 100 mg by mouth daily.   Yes [provider]  diclofenac sodium (VOLTAREN) 1 % GEL Apply 4 g topically 4 (four) times daily. To lower back Patient not taking: Reported on 08/30/2022 02/04/19   Hollace Kinnier L, DO  finasteride (PROSCAR) 5 MG tablet Take 1 tablet (5 mg total) by mouth daily. 12/01/21   Reed, Tiffany L, DO  glucose blood (ONETOUCH ULTRA) test strip USE TO CHECK BLOOD SUGARS ONCE DAILY AS DIRECTED FOR DIABETES 12/01/21   Reed, Tiffany L, DO  Lancets (ONETOUCH DELICA PLUS [id number]) MISC USE UP TO 4 TIMES A DAY AS DIRECTED 12/01/21   Reed, Tiffany L, DO  metFORMIN (GLUCOPHAGE) 1000 MG tablet Take 1 tablet (1,000 mg total) by mouth 2 (two) times daily with a meal. 12/01/21   Reed, Tiffany L, DO  mometasone (ELOCON) 0.1 % ointment SMARTSIG:Sparingly Topical Twice Daily 05/11/21   [provider]  rosuvastatin (CRESTOR) 20 MG tablet Take 1 tablet (20 mg total) by mouth daily. 12/01/21   Reed, Tiffany L, DO  sitaGLIPtin (JANUVIA) 100 MG tablet  Take 1 tablet (100 mg total) by mouth daily. 12/01/21   Reed, Tiffany L, DO  tamsulosin (FLOMAX) 0.4 MG CAPS capsule Take 1 capsule (0.4 mg total) by mouth daily. 12/01/21   Reed, Tiffany L, DO  TOBRADEX ophthalmic solution Place 1 drop into the right eye 4 (four) times daily. 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   Bilateral impacted cerumen 06/10/2019   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   Seasonal allergies 03/25/2019   Tubular adenoma of colon 03/25/2014   Type 2 diabetes mellitus with stage 2 chronic kidney disease, without long-term current use of insulin ([place]) 03/25/2014   Anemia, iron deficiency 03/25/2014   Essential hypertension, benign 03/25/2014   Hyperlipidemia 03/25/2014   Personal history of colon cancer, stage II 03/25/2014   Prostate nodule without urinary obstruction 03/25/2014    Past Medical History:  Diagnosis Date   Arthritis    Chronic kidney disease    CKD- Stage  II   Colon cancer ([place])    2014   Colon polyps    adenomatous   Diabetes mellitus without complication ([place])    Eczema    Hypercholesteremia    Hypertension     Past Surgical History:  Procedure Laterality Date   COLON SURGERY  2014 & 2015   resection  of colon & removal of poylp ; Dr Kathleene Hazel, Dr Sherlene Shams   COLONOSCOPY  09/29/2013   COLONOSCOPY WITH PROPOFOL N/A 03/10/2015   Procedure: COLONOSCOPY WITH PROPOFOL;  Surgeon: Justin Castle, MD;  Location: [facility];  Service: Endoscopy;  Laterality: N/A;   [place] N/A 07/21/2014   Procedure: FLEXIBLE SIGMOIDOSCOPY;  Surgeon: Justin Castle, MD;  Location: [facility];  Service: Endoscopy;  Laterality: N/A;    Social History   Socioeconomic History   Marital status: Single    Spouse name: Not on file   Number of  children: Not on file   Years of education: Not on file   Highest education level: Not on file  Occupational History   Not on file  Tobacco Use   Smoking status: Never   Smokeless tobacco: Never  Vaping Use   Vaping Use: Never used  Substance and Sexual Activity   Alcohol use: Yes    Alcohol/week: 7.0 standard drinks of alcohol    Types: 7 Cans of beer per week    Comment: weekly   Drug use: No   Sexual activity: Not on file  Other Topics Concern   Not on file  Social History Narrative   Single, lives in 2 stories home with 3 people and 1 dog.  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, has Living Will & POA (sister)   Right handed   Social Determinants of Health   Financial Resource Strain: Low Risk  (05/22/2018)   Overall Financial Resource Strain (CARDIA)    Difficulty of Paying Living Expenses: Not hard at all  Food Insecurity: No Food Insecurity (05/22/2018)   Hunger Vital Sign    Worried About Running Out of Food in the Last Year: Never true    Ran Out of Food in the Last Year: Never true  Transportation Needs: No Transportation Needs (05/22/2018)   PRAPARE - Hydrologist (Medical): No    Lack of Transportation (Non-Medical): No  Physical Activity: Insufficiently Active (05/22/2018)   Exercise Vital Sign    Days of Exercise per Week: 7 days    Minutes of Exercise per Session: 10 min  Stress: No Stress Concern Present (05/22/2018)   [place]    Feeling of Stress : Not at all  Social Connections: Somewhat Isolated (05/22/2018)   Social Connection and Isolation Panel [NHANES]    Frequency of Communication with Friends and Family: More than three times a week    Frequency of Social Gatherings with Friends and Family: More than three times a week    Attends Religious Services: More than 4 times per year    Active Member of [employer] or Organizations: No     Attends Archivist Meetings: Never    Marital Status: Never married  Intimate Partner Violence: Not At Risk (05/22/2018)   Humiliation, Afraid, Rape, and Kick questionnaire    Fear of Current or Ex-Partner: No    Emotionally Abused: No    Physically Abused: No    Sexually Abused: No    Family History  Problem Relation Age of Onset   Varicose Veins Mother    Stroke Mother    Glaucoma Mother    Kidney disease Mother    Hypertension Father    Heart attack Father    Cancer Brother        testicular cancer   Hyperlipidemia  Sister    Meniere's disease Sister      Review of Systems  Constitutional: Negative.  Negative for chills and fever.  HENT:  Positive for hearing loss. Negative for congestion and sore throat.   Respiratory: Negative.  Negative for cough and shortness of breath.   Cardiovascular: Negative.  Negative for chest pain and palpitations.  Gastrointestinal:  Negative for abdominal pain, diarrhea, nausea and vomiting.  Genitourinary: Negative.  Negative for dysuria and flank pain.  Skin: Negative.  Negative for rash.  Neurological: Negative.  Negative for dizziness and headaches.  All other systems reviewed and are negative.   Today's Vitals   08/30/22 0903  Weight: 181 lb 8 oz (82.3 kg)  Height: '5\' 6"'$  (1.676 m)   Body mass index is 29.29 kg/m.  Physical Exam Vitals reviewed.  Constitutional:      Appearance: Normal appearance.  HENT:     Head: Normocephalic.     Right Ear: There is impacted cerumen.     Left Ear: There is impacted cerumen.     Mouth/Throat:     Mouth: Mucous membranes are moist.     Pharynx: Oropharynx is clear.  Eyes:     Extraocular Movements: Extraocular movements intact.     Pupils: Pupils are equal, round, and reactive to light.     Comments: History of bilateral cataracts  Cardiovascular:     Rate and Rhythm: Normal rate and regular rhythm.     Pulses: Normal pulses.     Heart sounds: Normal heart sounds.   Pulmonary:     Effort: Pulmonary effort is normal.     Breath sounds: Normal breath sounds.  Abdominal:     Palpations: Abdomen is soft.     Tenderness: There is no abdominal tenderness.  Musculoskeletal:     Cervical back: No tenderness.     Right lower leg: No edema.     Left lower leg: No edema.  Lymphadenopathy:     Cervical: No cervical adenopathy.  Skin:    General: Skin is warm and dry.  Neurological:     General: No focal deficit present.     Mental Status: He is alert and oriented to person, place, and time.  Psychiatric:        Mood and Affect: Mood normal.        Behavior: Behavior normal.    Ceruminosis is noted.  After obtaining verbal consent from patient wax is removed by syringing and manual debridement.  Tolerated procedure well.  No complications.  Instructions for home care to prevent wax buildup are given.   ASSESSMENT & PLAN: A total of 62 minutes was spent with the patient and counseling/coordination of care regarding preparing for this visit, review of available medical records, establishing care with me, review of multiple chronic medical conditions under management, review of all medications, review of most recent blood work results, education on nutrition, cardiovascular risks associated with hypertension, diabetes, dyslipidemia, and chronic kidney disease, review of health maintenance items, comprehensive history and physical examination, prognosis, documentation, and need for follow-up.  Problem List Items Addressed This Visit       Cardiovascular and Mediastinum   Essential hypertension, benign   Hypertension associated with diabetes ([place]) - Primary    Well-controlled hypertension. Not sure whether he is on medication or not Well-controlled diabetes. Lab Results  Component Value Date   HGBA1C 5.9 (H) 09/28/2020  We will recheck his hemoglobin A1c today In the meantime continue metformin 1000 mg twice a  day and Januvia 100 mg  daily Cardiovascular risk associated with hypertension and diabetes discussed. Diet and nutrition discussed.       Relevant Orders   Comprehensive metabolic panel   CBC with Differential/Platelet   Hemoglobin A1c   Lipid panel     Endocrine   Dyslipidemia associated with type 2 diabetes mellitus (HCC)    Stable.  Diet and nutrition discussed. Lipid profile done today. Continue rosuvastatin 20 mg daily.      Relevant Orders   Lipid panel     Nervous and Auditory   Bilateral impacted cerumen    Bilateral impaction affecting hearing. Ear lavage done        Genitourinary   Stage 3b chronic kidney disease ([place])    Advised to stay well-hydrated and avoid NSAIDs. Blood work done today.      Relevant Orders   Comprehensive metabolic panel   Other Visit Diagnoses     Encounter to establish care          Patient Instructions  Health Maintenance After Age 65 After age 62, you are at a higher risk for certain long-term diseases and infections as well as injuries from falls. Falls are a major cause of broken bones and head injuries in people who are older than age 56. Getting regular preventive care can help to keep you healthy and well. Preventive care includes getting regular testing and making lifestyle changes as recommended by your health care provider. Talk with your health care provider about: Which screenings and tests you should have. A screening is a test that checks for a disease when you have no symptoms. A diet and exercise plan that is right for you. What should I know about screenings and tests to prevent falls? Screening and testing are the best ways to find a health problem early. Early diagnosis and treatment give you the best chance of managing medical conditions that are common after age 70. Certain conditions and lifestyle choices may make you more likely to have a fall. Your health care provider may recommend: Regular vision checks. Poor vision and  conditions such as cataracts can make you more likely to have a fall. If you wear glasses, make sure to get your prescription updated if your vision changes. Medicine review. Work with your health care provider to regularly review all of the medicines you are taking, including over-the-counter medicines. Ask your health care provider about any side effects that may make you more likely to have a fall. Tell your health care provider if any medicines that you take make you feel dizzy or sleepy. Strength and balance checks. Your health care provider may recommend certain tests to check your strength and balance while standing, walking, or changing positions. Foot health exam. Foot pain and numbness, as well as not wearing proper footwear, can make you more likely to have a fall. Screenings, including: Osteoporosis screening. Osteoporosis is a condition that causes the bones to get weaker and break more easily. Blood pressure screening. Blood pressure changes and medicines to control blood pressure can make you feel dizzy. Depression screening. You may be more likely to have a fall if you have a fear of falling, feel depressed, or feel unable to do activities that you used to do. Alcohol use screening. Using too much alcohol can affect your balance and may make you more likely to have a fall. Follow these instructions at home: Lifestyle Do not drink alcohol if: Your health care provider tells you not to  drink. If you drink alcohol: Limit how much you have to: 0-1 drink a day for women. 0-2 drinks a day for men. Know how much alcohol is in your drink. In the [place] one drink equals one 12 oz bottle of beer (355 mL), one 5 oz glass of wine (148 mL), or one 1 oz glass of hard liquor (44 mL). Do not use any products that contain nicotine or tobacco. These products include cigarettes, chewing tobacco, and vaping devices, such as e-cigarettes. If you need help quitting, ask your health care  provider. Activity  Follow a regular exercise program to stay fit. This will help you maintain your balance. Ask your health care provider what types of exercise are appropriate for you. If you need a cane or walker, use it as recommended by your health care provider. Wear supportive shoes that have nonskid soles. Safety  Remove any tripping hazards, such as rugs, cords, and clutter. Install safety equipment such as grab bars in bathrooms and safety rails on stairs. Keep rooms and walkways well-lit. General instructions Talk with your health care provider about your risks for falling. Tell your health care provider if: You fall. Be sure to tell your health care provider about all falls, even ones that seem minor. You feel dizzy, tiredness (fatigue), or off-balance. Take over-the-counter and prescription medicines only as told by your health care provider. These include supplements. Eat a healthy diet and maintain a healthy weight. A healthy diet includes low-fat dairy products, low-fat (lean) meats, and fiber from whole grains, beans, and lots of fruits and vegetables. Stay current with your vaccines. Schedule regular health, dental, and eye exams. Summary Having a healthy lifestyle and getting preventive care can help to protect your health and wellness after age 25. Screening and testing are the best way to find a health problem early and help you avoid having a fall. Early diagnosis and treatment give you the best chance for managing medical conditions that are more common for people who are older than age 6. Falls are a major cause of broken bones and head injuries in people who are older than age 67. Take precautions to prevent a fall at home. Work with your health care provider to learn what changes you can make to improve your health and wellness and to prevent falls. This information is not intended to replace advice given to you by your health care provider. Make sure you discuss  any questions you have with your health care provider. Document Revised: 12/07/2020 Document Reviewed: 12/07/2020 Elsevier Patient Education  [place]bb, MD [facility]

## 2022-08-30 NOTE — Assessment & Plan Note (Signed)
Advised to stay well-hydrated and avoid NSAIDs. Blood work done today.

## 2022-08-30 NOTE — Patient Instructions (Signed)
Health Maintenance After Age 83 After age 83, you are at a higher risk for certain long-term diseases and infections as well as injuries from falls. Falls are a major cause of broken bones and head injuries in people who are older than age 83. Getting regular preventive care can help to keep you healthy and well. Preventive care includes getting regular testing and making lifestyle changes as recommended by your health care provider. Talk with your health care provider about: Which screenings and tests you should have. A screening is a test that checks for a disease when you have no symptoms. A diet and exercise plan that is right for you. What should I know about screenings and tests to prevent falls? Screening and testing are the best ways to find a health problem early. Early diagnosis and treatment give you the best chance of managing medical conditions that are common after age 83. Certain conditions and lifestyle choices may make you more likely to have a fall. Your health care provider may recommend: Regular vision checks. Poor vision and conditions such as cataracts can make you more likely to have a fall. If you wear glasses, make sure to get your prescription updated if your vision changes. Medicine review. Work with your health care provider to regularly review all of the medicines you are taking, including over-the-counter medicines. Ask your health care provider about any side effects that may make you more likely to have a fall. Tell your health care provider if any medicines that you take make you feel dizzy or sleepy. Strength and balance checks. Your health care provider may recommend certain tests to check your strength and balance while standing, walking, or changing positions. Foot health exam. Foot pain and numbness, as well as not wearing proper footwear, can make you more likely to have a fall. Screenings, including: Osteoporosis screening. Osteoporosis is a condition that causes  the bones to get weaker and break more easily. Blood pressure screening. Blood pressure changes and medicines to control blood pressure can make you feel dizzy. Depression screening. You may be more likely to have a fall if you have a fear of falling, feel depressed, or feel unable to do activities that you used to do. Alcohol use screening. Using too much alcohol can affect your balance and may make you more likely to have a fall. Follow these instructions at home: Lifestyle Do not drink alcohol if: Your health care provider tells you not to drink. If you drink alcohol: Limit how much you have to: 0-1 drink a day for women. 0-2 drinks a day for men. Know how much alcohol is in your drink. In the [place] one drink equals one 12 oz bottle of beer (355 mL), one 5 oz glass of wine (148 mL), or one 1 oz glass of hard liquor (44 mL). Do not use any products that contain nicotine or tobacco. These products include cigarettes, chewing tobacco, and vaping devices, such as e-cigarettes. If you need help quitting, ask your health care provider. Activity  Follow a regular exercise program to stay fit. This will help you maintain your balance. Ask your health care provider what types of exercise are appropriate for you. If you need a cane or walker, use it as recommended by your health care provider. Wear supportive shoes that have nonskid soles. Safety  Remove any tripping hazards, such as rugs, cords, and clutter. Install safety equipment such as grab bars in bathrooms and safety rails on stairs. Keep rooms and walkways   well-lit. General instructions Talk with your health care provider about your risks for falling. Tell your health care provider if: You fall. Be sure to tell your health care provider about all falls, even ones that seem minor. You feel dizzy, tiredness (fatigue), or off-balance. Take over-the-counter and prescription medicines only as told by your health care provider. These include  supplements. Eat a healthy diet and maintain a healthy weight. A healthy diet includes low-fat dairy products, low-fat (lean) meats, and fiber from whole grains, beans, and lots of fruits and vegetables. Stay current with your vaccines. Schedule regular health, dental, and eye exams. Summary Having a healthy lifestyle and getting preventive care can help to protect your health and wellness after age 83. Screening and testing are the best way to find a health problem early and help you avoid having a fall. Early diagnosis and treatment give you the best chance for managing medical conditions that are more common for people who are older than age 83. Falls are a major cause of broken bones and head injuries in people who are older than age 83. Take precautions to prevent a fall at home. Work with your health care provider to learn what changes you can make to improve your health and wellness and to prevent falls. This information is not intended to replace advice given to you by your health care provider. Make sure you discuss any questions you have with your health care provider. Document Revised: 12/07/2020 Document Reviewed: 12/07/2020 Elsevier Patient Education  2023 Elsevier Inc[employer]  

## 2022-08-30 NOTE — Assessment & Plan Note (Signed)
Well-controlled hypertension. Not sure whether he is on medication or not Well-controlled diabetes. Lab Results  Component Value Date   HGBA1C 5.9 (H) 09/28/2020  We will recheck his hemoglobin A1c today In the meantime continue metformin 1000 mg twice a day and Januvia 100 mg daily Cardiovascular risk associated with hypertension and diabetes discussed. Diet and nutrition discussed.

## 2022-09-05 ENCOUNTER — Encounter: Payer: Self-pay | Admitting: Podiatry

## 2022-09-05 ENCOUNTER — Ambulatory Visit (INDEPENDENT_AMBULATORY_CARE_PROVIDER_SITE_OTHER): Payer: Medicare Other | Admitting: Podiatry

## 2022-09-05 VITALS — BP 118/64

## 2022-09-05 DIAGNOSIS — M79675 Pain in left toe(s): Secondary | ICD-10-CM

## 2022-09-05 DIAGNOSIS — N182 Chronic kidney disease, stage 2 (mild): Secondary | ICD-10-CM

## 2022-09-05 DIAGNOSIS — M79674 Pain in right toe(s): Secondary | ICD-10-CM

## 2022-09-05 DIAGNOSIS — E1122 Type 2 diabetes mellitus with diabetic chronic kidney disease: Secondary | ICD-10-CM

## 2022-09-05 DIAGNOSIS — B351 Tinea unguium: Secondary | ICD-10-CM | POA: Diagnosis not present

## 2022-09-05 NOTE — Progress Notes (Signed)
  Subjective:  Patient ID: Justin [name], male    DOB: 11-23-1940,  MRN: [id number]  Justin [name] presents to clinic today for at risk foot care. Pt has h/o NIDDM with chronic kidney disease and painful thick toenails that are difficult to trim. Pain interferes with ambulation. Aggravating factors include wearing enclosed shoe gear. Pain is relieved with periodic professional debridement.  Chief Complaint  Patient presents with   Nail Problem    [facility] BS-210 last night A1C-do not know PCP-Sargardia PCP VST-1 week ago PCP VST-   New problem(s): None.   PCP is Justin [name], Justin Bloomer, MD.  No Known Allergies  Review of Systems: Negative except as noted in the HPI.  Objective:  Vitals:   09/05/22 0924  BP: 118/64   Justin [name] is a pleasant 82 y.o. male in NAD. AAO x 3.  Vascular Examination: CFT <3 seconds b/l LE. Palpable DP pulse(s) b/l LE. Palpable PT pulse(s) b/l LE. Pedal hair sparse. No pain with calf compression b/l. Lower extremity skin temperature gradient within normal limits. Varicosities present b/l. No cyanosis or clubbing noted b/l LE.  Dermatological Examination: Pedal integument with normal turgor, texture and tone BLE. No open wounds b/l LE. No interdigital macerations noted b/l LE.   Dried heme distal tip of right 3rd and 4th digits.   Toenails 1-5 b/l elongated, discolored, dystrophic, thickened, crumbly with subungual debris and tenderness to dorsal palpation. No hyperkeratotic nor porokeratotic lesions present on today's visit.  Neurological Examination: Protective sensation intact 5/5 intact bilaterally with 10g monofilament b/l. Vibratory sensation decreased right lower extremity. Vibratory sensation diminished left lower extremity. Deep tendon reflexes normal b/l.   Musculoskeletal Examination: Muscle strength 5/5 to all lower extremity muscle groups bilaterally. HAV with bunion deformity noted b/l LE. Hammertoe deformity noted 2-5 b/l. Utilizes cane  for ambulation assistance.  Assessment/Plan: 1. 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 Type 2 diabetes mellitus with stage 2 chronic kidney disease, without long-term current use of insulin ([place])     -Consent given for treatment as described below: -Examined patient. -Continue supportive shoe gear daily. -Toenails 1-5 b/l were debrided in length and girth with sterile nail nippers and dremel without iatrogenic bleeding.  -Patient/POA to call should there be question/concern in the interim.   Return in about 3 months (around 12/04/2022).  Marzetta Board, DPM

## 2022-09-26 ENCOUNTER — Telehealth: Payer: Self-pay | Admitting: *Deleted

## 2022-09-26 NOTE — Telephone Encounter (Signed)
Please advise on refill of Metformin as original rx was prescribed by another provider

## 2022-09-27 ENCOUNTER — Other Ambulatory Visit: Payer: Self-pay | Admitting: Emergency Medicine

## 2022-09-27 DIAGNOSIS — N182 Chronic kidney disease, stage 2 (mild): Secondary | ICD-10-CM

## 2022-09-27 MED ORDER — METFORMIN HCL 1000 MG PO TABS: 1000.0000 mg | ORAL_TABLET | Freq: Two times a day (BID) | ORAL | 1 refills | Status: DC

## 2022-09-27 NOTE — Telephone Encounter (Signed)
New prescription for metformin sent to pharmacy of record today.  Thanks.

## 2022-09-27 NOTE — Telephone Encounter (Signed)
Notified pt rx sister rx sent to [facility].Marland KitchenJohny Chess

## 2022-09-27 NOTE — Telephone Encounter (Signed)
Patient's sister called and is wanting this refilled

## 2022-11-16 ENCOUNTER — Other Ambulatory Visit: Payer: Self-pay | Admitting: *Deleted

## 2022-11-16 ENCOUNTER — Telehealth: Payer: Self-pay | Admitting: Emergency Medicine

## 2022-11-16 MED ORDER — ONETOUCH ULTRA VI STRP: ORAL_STRIP | 10 refills | Status: DC

## 2022-11-16 NOTE — Telephone Encounter (Signed)
Prescription Request  11/16/2022  LOV: 08/30/2022  What is the name of the medication or equipment? One touch ultra strips  Have you contacted your pharmacy to request a refill? No   Which pharmacy would you like this sent to?  [facility] [id number] [place], [place] - [address] [place] [address] Phone: [phone] Fax: [phone]   Patient notified that their request is being sent to the clinical staff for review and that they should receive a response within 2 business days.   Please advise at Mobile [phone] (mobile)

## 2022-11-16 NOTE — Telephone Encounter (Signed)
New prescription sent to patient pharmacy.

## 2022-11-23 ENCOUNTER — Telehealth: Payer: Self-pay

## 2022-11-23 NOTE — Telephone Encounter (Signed)
Contacted Karenann Cai to schedule their annual wellness visit. Appointment made for 11/29/22.  Agnes Lawrence, CMA (AAMA)  [facility]- AWV Program [phone]

## 2022-11-29 ENCOUNTER — Telehealth: Payer: Self-pay

## 2022-11-29 DIAGNOSIS — H02115 Cicatricial ectropion of left lower eyelid: Secondary | ICD-10-CM | POA: Diagnosis not present

## 2022-11-29 DIAGNOSIS — H01131 Eczematous dermatitis of right upper eyelid: Secondary | ICD-10-CM | POA: Diagnosis not present

## 2022-11-29 DIAGNOSIS — H2513 Age-related nuclear cataract, bilateral: Secondary | ICD-10-CM | POA: Diagnosis not present

## 2022-11-29 DIAGNOSIS — H02112 Cicatricial ectropion of right lower eyelid: Secondary | ICD-10-CM | POA: Diagnosis not present

## 2022-11-29 DIAGNOSIS — E119 Type 2 diabetes mellitus without complications: Secondary | ICD-10-CM | POA: Diagnosis not present

## 2022-11-29 DIAGNOSIS — H01132 Eczematous dermatitis of right lower eyelid: Secondary | ICD-10-CM | POA: Diagnosis not present

## 2022-11-29 DIAGNOSIS — H01134 Eczematous dermatitis of left upper eyelid: Secondary | ICD-10-CM | POA: Diagnosis not present

## 2022-11-29 NOTE — Telephone Encounter (Signed)
Called patient lvm to return call, to complete AWV at [phone].  If no return call within 15 minutes, patient may reschedule for the next available appointment with NHA or CMA.  Aalaiyah Yassin N. Bevely Hackbart, LPN. [facility] AWV Team Direct Dial: [phone]  

## 2022-12-06 ENCOUNTER — Ambulatory Visit (INDEPENDENT_AMBULATORY_CARE_PROVIDER_SITE_OTHER): Payer: Medicare Other | Admitting: Podiatry

## 2022-12-06 ENCOUNTER — Encounter: Payer: Self-pay | Admitting: Podiatry

## 2022-12-06 DIAGNOSIS — E1122 Type 2 diabetes mellitus with diabetic chronic kidney disease: Secondary | ICD-10-CM

## 2022-12-06 DIAGNOSIS — M79674 Pain in right toe(s): Secondary | ICD-10-CM | POA: Diagnosis not present

## 2022-12-06 DIAGNOSIS — M79675 Pain in left toe(s): Secondary | ICD-10-CM

## 2022-12-06 DIAGNOSIS — B351 Tinea unguium: Secondary | ICD-10-CM | POA: Diagnosis not present

## 2022-12-06 DIAGNOSIS — N182 Chronic kidney disease, stage 2 (mild): Secondary | ICD-10-CM

## 2022-12-06 NOTE — Progress Notes (Signed)
  Subjective:  Patient ID: Justin [name], male    DOB: 06/04/1941,  MRN: [id number]  Karenann Cai presents to clinic today for  Chief Complaint  Patient presents with   Nail Problem    [facility] BS-156 A1C-do not know PCP-Sagardia PCP VST-06/2022   New problem(s): None.   PCP is Sagardia, Eilleen Kempf, MD.  No Known Allergies  Review of Systems: Negative except as noted in the HPI. Objective:  Vascular Examination: Capillary refill time immediate b/l. Vascular status intact b/l with palpable pedal pulses. Pedal hair present b/l. No edema. No pain with calf compression b/l. Skin temperature gradient WNL b/l. No cyanosis or clubbing noted b/l LE.  Neurological Examination: Protective sensation intact 5/5 intact bilaterally with 10g monofilament b/l. Vibratory sensation intact b/l. Vibratory sensation decreased right lower extremity. Vibratory sensation diminished left lower extremity.  Dermatological Examination: Pedal skin with normal turgor, texture and tone b/l.  No open wounds. No interdigital macerations.   Toenails 1-5 b/l thick, discolored, elongated with subungual debris and pain on dorsal palpation.   No hyperkeratotic nor porokeratotic lesions present on today's visit.  Musculoskeletal Examination: Muscle strength 5/5 to all lower extremity muscle groups bilaterally. HAV with bunion bilaterally and hammertoes 2-5 b/l. Utilizes cane for ambulation assistance.  Radiographs: None  Last A1c:      Latest Ref Rng & Units 08/30/2022    9:50 AM  Hemoglobin A1C  Hemoglobin-A1c 4.6 - 6.5 % 6.8    Radiographs: None  Assessment:   1. 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 Type 2 diabetes mellitus with stage 2 chronic kidney disease, without long-term current use of insulin (HCC)    Plan:  Patient was evaluated and treated and all questions answered. Consent given for treatment as described below: Patient was evaluated and treated. All patient's and/or  POA's questions/concerns addressed on today's visit. Toenails 1-5 debrided in length and girth without incident. Continue soft, supportive shoe gear daily. Report any pedal injuries to medical professional. -Patient/POA to call should there be question/concern in the interim.  Return in about 3 months (around 03/08/2023).  Freddie Breech, DPM

## 2022-12-12 ENCOUNTER — Ambulatory Visit (INDEPENDENT_AMBULATORY_CARE_PROVIDER_SITE_OTHER): Payer: Medicare Other | Admitting: Emergency Medicine

## 2022-12-12 ENCOUNTER — Encounter: Payer: Self-pay | Admitting: Emergency Medicine

## 2022-12-12 VITALS — BP 120/66 | HR 70 | Temp 98.2°F | Ht 66.0 in | Wt 188.1 lb

## 2022-12-12 DIAGNOSIS — H6123 Impacted cerumen, bilateral: Secondary | ICD-10-CM

## 2022-12-12 NOTE — Assessment & Plan Note (Signed)
Both ears irrigated and  obstruction relieved Earwax irrigated out bilaterally Hearing improved after procedure. No complications.  Tolerated procedure well.

## 2022-12-12 NOTE — Patient Instructions (Signed)

## 2022-12-12 NOTE — Progress Notes (Signed)
PRE-PROCEDURE EXAM: Bilateral TMs cannot be visualized due to total occlusion/impaction of the ear canal.  PROCEDURE INDICATION: remove wax to visualize ear drum & relieve discomfort  CONSENT:  Verbal     PROCEDURE NOTE:     Bilateral EAR:  I used warm water irrigation under direct visualization with the otoscope to free the wax bolus from the ear canal.     POST- PROCEDURE EXAM:  Bilateral TMs successfully visualized and found to have no erythema     The patient tolerated the procedure well.

## 2022-12-12 NOTE — Progress Notes (Signed)
Justin [name] 82 y.o.   Chief Complaint  Patient presents with   Cerumen Impaction    Patient states he has some wax build up in both ears     HISTORY OF PRESENT ILLNESS: Acute problem visit today. This is a 82 y.o. male complaining of bilateral ear wax buildup affecting hearing  HPI   Prior to Admission medications   Medication Sig Start Date End Date Taking? Authorizing Provider  docusate sodium (COLACE) 100 MG capsule Take 100 mg by mouth daily.   Yes [provider]  finasteride (PROSCAR) 5 MG tablet Take 1 tablet (5 mg total) by mouth daily. 12/01/21  Yes Reed, Tiffany L, DO  glucose blood (ONETOUCH ULTRA) test strip USE TO CHECK BLOOD SUGARS ONCE DAILY AS DIRECTED FOR DIABETES 11/16/22  Yes Koralee Wedeking, Eilleen Kempf, MD  Lancets [facility] DELICA PLUS [place]) MISC USE UP TO 4 TIMES A DAY AS DIRECTED 12/01/21  Yes Reed, Tiffany L, DO  metFORMIN (GLUCOPHAGE) 1000 MG tablet Take 1 tablet (1,000 mg total) by mouth 2 (two) times daily with a meal. 09/27/22  Yes Deion Forgue, Eilleen Kempf, MD  rosuvastatin (CRESTOR) 20 MG tablet Take 1 tablet (20 mg total) by mouth daily. 12/01/21  Yes Reed, Tiffany L, DO  sitaGLIPtin (JANUVIA) 100 MG tablet Take 1 tablet (100 mg total) by mouth daily. 12/01/21  Yes Reed, Tiffany L, DO  tamsulosin (FLOMAX) 0.4 MG CAPS capsule Take 1 capsule (0.4 mg total) by mouth daily. 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   Seasonal allergies 03/25/2019   Tubular adenoma of colon 03/25/2014   Dyslipidemia associated with type 2 diabetes mellitus (HCC) 03/25/2014   Anemia, iron deficiency 03/25/2014   Essential hypertension, benign 03/25/2014    Hyperlipidemia 03/25/2014   Personal history of colon cancer, stage II 03/25/2014   Prostate nodule without urinary obstruction 03/25/2014    Past Medical History:  Diagnosis Date   Arthritis    Chronic kidney disease    CKD- Stage  II   Colon cancer (HCC)    2014   Colon polyps    adenomatous   Diabetes mellitus without complication (HCC)    Eczema    Hypercholesteremia    Hypertension     Past Surgical History:  Procedure Laterality Date   COLON SURGERY  2014 & 2015   resection of colon & removal of poylp ; Dr June Leap, Dr De Burrs   COLONOSCOPY  09/29/2013   COLONOSCOPY WITH PROPOFOL N/A 03/10/2015   Procedure: COLONOSCOPY WITH PROPOFOL;  Surgeon: Louis Meckel, MD;  Location: [facility];  Service: Endoscopy;  Laterality: N/A;   EXTERNAL EAR SURGERY  1954   FLEXIBLE SIGMOIDOSCOPY N/A 07/21/2014   Procedure: FLEXIBLE SIGMOIDOSCOPY;  Surgeon: Louis Meckel, MD;  Location: [facility];  Service: Endoscopy;  Laterality: N/A;    Social History   Socioeconomic History   Marital status: Single    Spouse name: Not on file   Number of children: Not on file   Years of education: Not on file   Highest education level: Not on file  Occupational History   Not on file  Tobacco Use   Smoking status: Never  Smokeless tobacco: Never  Vaping Use   Vaping Use: Never used  Substance and Sexual Activity   Alcohol use: Yes    Alcohol/week: 7.0 standard drinks of alcohol    Types: 7 Cans of beer per week    Comment: weekly   Drug use: No   Sexual activity: Not on file  Other Topics Concern   Not on file  Social History Narrative   Single, lives in 2 stories home with 3 people and 1 dog.  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, has Living Will & POA (sister)   Right handed   Social Determinants of Health   Financial Resource Strain: Low Risk  (05/22/2018)   Overall Financial Resource Strain (CARDIA)    Difficulty of Paying Living Expenses: Not  hard at all  Food Insecurity: No Food Insecurity (05/22/2018)   Hunger Vital Sign    Worried About Running Out of Food in the Last Year: Never true    Ran Out of Food in the Last Year: Never true  Transportation Needs: No Transportation Needs (05/22/2018)   PRAPARE - Administrator, Civil Service (Medical): No    Lack of Transportation (Non-Medical): No  Physical Activity: Insufficiently Active (05/22/2018)   Exercise Vital Sign    Days of Exercise per Week: 7 days    Minutes of Exercise per Session: 10 min  Stress: No Stress Concern Present (05/22/2018)   [employer] of Occupational Health - Occupational Stress Questionnaire    Feeling of Stress : Not at all  Social Connections: Somewhat Isolated (05/22/2018)   Social Connection and Isolation Panel [NHANES]    Frequency of Communication with Friends and Family: More than three times a week    Frequency of Social Gatherings with Friends and Family: More than three times a week    Attends Religious Services: More than 4 times per year    Active Member of [employer] or Organizations: No    Attends Banker Meetings: Never    Marital Status: Never married  Intimate Partner Violence: Not At Risk (05/22/2018)   Humiliation, Afraid, Rape, and Kick questionnaire    Fear of Current or Ex-Partner: No    Emotionally Abused: No    Physically Abused: No    Sexually Abused: No    Family History  Problem Relation Age of Onset   Varicose Veins Mother    Stroke Mother    Glaucoma Mother    Kidney disease Mother    Hypertension Father    Heart attack Father    Cancer Brother        testicular cancer   Hyperlipidemia Sister    Meniere's disease Sister      Review of Systems  Constitutional: Negative.  Negative for chills and fever.  HENT:  Positive for hearing loss. Negative for congestion and sore throat.   Respiratory: Negative.  Negative for cough.   Cardiovascular: Negative.  Negative for chest pain and  palpitations.  Gastrointestinal:  Negative for abdominal pain, diarrhea, nausea and vomiting.  Skin: Negative.  Negative for rash.  Neurological:  Negative for dizziness and headaches.  All other systems reviewed and are negative.   Vitals:   12/12/22 1556  BP: 120/66  Pulse: 70  Temp: 98.2 F (36.8 C)  SpO2: 96%    Physical Exam Vitals reviewed.  Constitutional:      Appearance: Normal appearance.  HENT:     Head: Normocephalic.     Right Ear:  There is impacted cerumen.     Left Ear: There is impacted cerumen.  Eyes:     Extraocular Movements: Extraocular movements intact.  Cardiovascular:     Rate and Rhythm: Normal rate.  Pulmonary:     Effort: Pulmonary effort is normal.  Skin:    General: Skin is warm and dry.     Capillary Refill: Capillary refill takes less than 2 seconds.  Neurological:     Mental Status: He is alert and oriented to person, place, and time.  Psychiatric:        Mood and Affect: Mood normal.        Behavior: Behavior normal.    Ceruminosis is noted.  After obtaining verbal consent from patient wax is removed by syringing and manual debridement.  Tolerated procedure well.  No complications.  Instructions for home care to prevent wax buildup are given.   ASSESSMENT & PLAN: Problem List Items Addressed This Visit       Nervous and Auditory   Hearing loss of both ears due to cerumen impaction - Primary    Both ears irrigated and  obstruction relieved Earwax irrigated out bilaterally Hearing improved after procedure. No complications.  Tolerated procedure well.      Relevant Orders   Ear Lavage   Patient Instructions  Earwax Buildup, Adult The ears produce a substance called earwax that helps keep bacteria out of the ear and protects the skin in the ear canal. Occasionally, earwax can build up in the ear and cause discomfort or hearing loss. What are the causes? This condition is caused by a buildup of earwax. Ear canals are  self-cleaning. Ear wax is made in the outer part of the ear canal and generally falls out in small amounts over time. When the self-cleaning mechanism is not working, earwax builds up and can cause decreased hearing and discomfort. Attempting to clean ears with cotton swabs can push the earwax deep into the ear canal and cause decreased hearing and pain. What increases the risk? This condition is more likely to develop in people who: Clean their ears often with cotton swabs. Pick at their ears. Use earplugs or in-ear headphones often, or wear hearing aids. The following factors may also make you more likely to develop this condition: Being male. Being of older age. Naturally producing more earwax. Having narrow ear canals. Having earwax that is overly thick or sticky. Having excess hair in the ear canal. Having eczema. Being dehydrated. What are the signs or symptoms? Symptoms of this condition include: Reduced or muffled hearing. A feeling of fullness in the ear or feeling that the ear is plugged. Fluid coming from the ear. Ear pain or an itchy ear. Ringing in the ear. Coughing. Balance problems. An obvious piece of earwax that can be seen inside the ear canal. How is this diagnosed? This condition may be diagnosed based on: Your symptoms. Your medical history. An ear exam. During the exam, your health care provider will look into your ear with an instrument called an otoscope. You may have tests, including a hearing test. How is this treated? This condition may be treated by: Using ear drops to soften the earwax. Having the earwax removed by a health care provider. The health care provider may: Flush the ear with water. Use an instrument that has a loop on the end (curette). Use a suction device. Having surgery to remove the wax buildup. This may be done in severe cases. Follow these instructions at home:  Take over-the-counter and prescription medicines only as told by  your health care provider. Do not put any objects, including cotton swabs, into your ear. You can clean the opening of your ear canal with a washcloth or facial tissue. Follow instructions from your health care provider about cleaning your ears. Do not overclean your ears. Drink enough fluid to keep your urine pale yellow. This will help to thin the earwax. Keep all follow-up visits as told. If earwax builds up in your ears often or if you use hearing aids, consider seeing your health care provider for routine, preventive ear cleanings. Ask your health care provider how often you should schedule your cleanings. If you have hearing aids, clean them according to instructions from the manufacturer and your health care provider. Contact a health care provider if: You have ear pain. You develop a fever. You have pus or other fluid coming from your ear. You have hearing loss. You have ringing in your ears that does not go away. You feel like the room is spinning (vertigo). Your symptoms do not improve with treatment. Get help right away if: You have bleeding from the affected ear. You have severe ear pain. Summary Earwax can build up in the ear and cause discomfort or hearing loss. The most common symptoms of this condition include reduced or muffled hearing, a feeling of fullness in the ear, or feeling that the ear is plugged. This condition may be diagnosed based on your symptoms, your medical history, and an ear exam. This condition may be treated by using ear drops to soften the earwax or by having the earwax removed by a health care provider. Do not put any objects, including cotton swabs, into your ear. You can clean the opening of your ear canal with a washcloth or facial tissue. This information is not intended to replace advice given to you by your health care provider. Make sure you discuss any questions you have with your health care provider. Document Revised: 11/05/2019 Document  Reviewed: 11/05/2019 Elsevier Patient Education  2023 Elsevier Inc.      Edwina Barth, MD [facility]

## 2022-12-16 ENCOUNTER — Ambulatory Visit (INDEPENDENT_AMBULATORY_CARE_PROVIDER_SITE_OTHER): Payer: Medicare Other | Admitting: *Deleted

## 2022-12-16 VITALS — BP 122/68 | HR 73 | Temp 97.8°F | Ht 68.0 in | Wt 187.5 lb

## 2022-12-16 DIAGNOSIS — N182 Chronic kidney disease, stage 2 (mild): Secondary | ICD-10-CM | POA: Diagnosis not present

## 2022-12-16 DIAGNOSIS — E782 Mixed hyperlipidemia: Secondary | ICD-10-CM

## 2022-12-16 DIAGNOSIS — Z0000 Encounter for general adult medical examination without abnormal findings: Secondary | ICD-10-CM | POA: Diagnosis not present

## 2022-12-16 DIAGNOSIS — E1122 Type 2 diabetes mellitus with diabetic chronic kidney disease: Secondary | ICD-10-CM | POA: Diagnosis not present

## 2022-12-16 MED ORDER — TAMSULOSIN HCL 0.4 MG PO CAPS: 0.4000 mg | ORAL_CAPSULE | Freq: Every day | ORAL | 3 refills | Status: DC

## 2022-12-16 MED ORDER — ROSUVASTATIN CALCIUM 20 MG PO TABS: 20.0000 mg | ORAL_TABLET | Freq: Every day | ORAL | 3 refills | Status: DC

## 2022-12-16 MED ORDER — SITAGLIPTIN PHOSPHATE 100 MG PO TABS: 100.0000 mg | ORAL_TABLET | Freq: Every day | ORAL | 3 refills | Status: DC

## 2022-12-16 MED ORDER — FINASTERIDE 5 MG PO TABS: 5.0000 mg | ORAL_TABLET | Freq: Every day | ORAL | 3 refills | Status: DC

## 2022-12-16 NOTE — Progress Notes (Signed)
Subjective:   Justin [name] is a 81 y.o. male who presents for Medicare Annual/Subsequent preventive examination.  Review of Systems    Defer to PCP Cardiac Risk Factors include: advanced age (>35men, >65 women);sedentary lifestyle;hypertension;male gender;diabetes mellitus    Please see problem list for additional risk factors Objective:    Today's Vitals   12/16/22 0903  BP: 122/68  Pulse: 73  Temp: 97.8 F (36.6 C)  TempSrc: Oral  SpO2: 97%  Weight: 187 lb 8 oz (85 kg)  Height: 5\' 8"  (1.727 m)   Body mass index is 28.51 kg/m.     12/16/2022    9:06 AM 01/11/2021    1:23 PM 12/04/2020    3:39 PM 12/03/2020    8:27 AM 09/28/2020   11:02 AM 07/07/2020    9:42 AM 05/29/2020   10:16 AM  Advanced Directives  Does Patient Have a Medical Advance Directive? Yes No No No Yes Yes No  Type of Advance Directive Living will    Living will Healthcare Power of Attorney   Does patient want to make changes to medical advance directive?     No - Patient declined No - Patient declined No - Patient declined  Copy of Healthcare Power of Attorney in Chart?      Yes - validated most recent copy scanned in chart (See row information)   Would patient like information on creating a medical advance directive?   No - Patient declined No - Patient declined       Current Medications (verified) Outpatient Encounter Medications as of 12/16/2022  Medication Sig   docusate sodium (COLACE) 100 MG capsule Take 100 mg by mouth daily.   finasteride (PROSCAR) 5 MG tablet Take 1 tablet (5 mg total) by mouth daily.   glucose blood (ONETOUCH ULTRA) test strip USE TO CHECK BLOOD SUGARS ONCE DAILY AS DIRECTED FOR DIABETES   Lancets (ONETOUCH DELICA PLUS LANCET33G) MISC USE UP TO 4 TIMES A DAY AS DIRECTED   metFORMIN (GLUCOPHAGE) 1000 MG tablet Take 1 tablet (1,000 mg total) by mouth 2 (two) times daily with a meal.   rosuvastatin (CRESTOR) 20 MG tablet Take 1 tablet (20 mg total) by mouth daily.   sitaGLIPtin  (JANUVIA) 100 MG tablet Take 1 tablet (100 mg total) by mouth daily.   tamsulosin (FLOMAX) 0.4 MG CAPS capsule Take 1 capsule (0.4 mg total) by mouth daily.   No facility-administered encounter medications on file as of 12/16/2022.    Allergies (verified) Patient has no known allergies.   History: Past Medical History:  Diagnosis Date   Arthritis    Chronic kidney disease    CKD- Stage  II   Colon cancer (HCC)    2014   Colon polyps    adenomatous   Diabetes mellitus without complication (HCC)    Eczema    Hypercholesteremia    Hypertension    Past Surgical History:  Procedure Laterality Date   COLON SURGERY  2014 & 2015   resection of colon & removal of poylp ; Dr June Leap, Dr De Burrs   COLONOSCOPY  09/29/2013   COLONOSCOPY WITH PROPOFOL N/A 03/10/2015   Procedure: COLONOSCOPY WITH PROPOFOL;  Surgeon: Louis Meckel, MD;  Location: [facility];  Service: Endoscopy;  Laterality: N/A;   EXTERNAL EAR SURGERY  1954   FLEXIBLE SIGMOIDOSCOPY N/A 07/21/2014   Procedure: FLEXIBLE SIGMOIDOSCOPY;  Surgeon: Louis Meckel, MD;  Location: [facility];  Service: Endoscopy;  Laterality: N/A;   Family History  Problem  Relation Age of Onset   Varicose Veins Mother    Stroke Mother    Glaucoma Mother    Kidney disease Mother    Hypertension Father    Heart attack Father    Cancer Brother        testicular cancer   Hyperlipidemia Sister    Meniere's disease Sister    Social History   Socioeconomic History   Marital status: Single    Spouse name: Not on file   Number of children: Not on file   Years of education: Not on file   Highest education level: Not on file  Occupational History   Not on file  Tobacco Use   Smoking status: Never   Smokeless tobacco: Never  Vaping Use   Vaping Use: Never used  Substance and Sexual Activity   Alcohol use: Yes    Alcohol/week: 7.0 standard drinks of alcohol    Types: 7 Cans of beer per week    Comment: weekly   Drug use: No    Sexual activity: Not on file  Other Topics Concern   Not on file  Social History Narrative   Single, lives in 2 stories home with 3 people and 1 dog.  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, has Living Will & POA (sister)   Right handed   Social Determinants of Health   Financial Resource Strain: Low Risk  (12/16/2022)   Overall Financial Resource Strain (CARDIA)    Difficulty of Paying Living Expenses: Not hard at all  Food Insecurity: No Food Insecurity (12/16/2022)   Hunger Vital Sign    Worried About Running Out of Food in the Last Year: Never true    Ran Out of Food in the Last Year: Never true  Transportation Needs: No Transportation Needs (12/16/2022)   PRAPARE - Administrator, Civil Service (Medical): No    Lack of Transportation (Non-Medical): No  Physical Activity: Inactive (12/16/2022)   Exercise Vital Sign    Days of Exercise per Week: 0 days    Minutes of Exercise per Session: 0 min  Stress: No Stress Concern Present (12/16/2022)   [employer] of Occupational Health - Occupational Stress Questionnaire    Feeling of Stress : Not at all  Social Connections: Moderately Isolated (12/16/2022)   Social Connection and Isolation Panel [NHANES]    Frequency of Communication with Friends and Family: More than three times a week    Frequency of Social Gatherings with Friends and Family: More than three times a week    Attends Religious Services: More than 4 times per year    Active Member of [employer] or Organizations: No    Attends Engineer, structural: Never    Marital Status: Never married    Tobacco Counseling Counseling given: Not Answered   Clinical Intake:     Pain : No/denies pain     Nutritional Status: BMI 25 -29 Overweight Nutritional Risks: None Diabetes: Yes CBG done?: Yes  What is the last grade level you completed in school?: 12th grade  Diabetic?Nutrition Risk Assessment:  Has the patient had any N/V/D  within the last 2 months?  No  Does the patient have any non-healing wounds?  No  Has the patient had any unintentional weight loss or weight gain?  No   Diabetes:  Is the patient diabetic?  Yes  If diabetic, was a CBG obtained today?  Yes  Did the patient bring in their glucometer from  home?  No  How often do you monitor your CBG's? Once a day.   Financial Strains and Diabetes Management:  Are you having any financial strains with the device, your supplies or your medication? No .  Does the patient want to be seen by Chronic Care Management for management of their diabetes?  No  Would the patient like to be referred to a Nutritionist or for Diabetic Management?  No   Diabetic Exams:  Diabetic Eye Exam: Completed unknown Diabetic Foot Exam: Completed 12/06/2022    Interpreter Needed?: No      Activities of Daily Living    12/16/2022    9:11 AM 12/16/2022    9:10 AM  In your present state of health, do you have any difficulty performing the following activities:  Hearing?  0  Vision?  0  Difficulty concentrating or making decisions?  0  Walking or climbing stairs?  0  Dressing or bathing?  0  Doing errands, shopping?  0  Preparing Food and eating ? N   Using the Toilet? N   In the past six months, have you accidently leaked urine? N   Do you have problems with loss of bowel control? N   Managing your Medications? N   Managing your Finances? N   Housekeeping or managing your Housekeeping? N     Patient Care Team: Georgina Quint, MD as PCP - General (Internal Medicine) Sallye Lat, MD as Consulting Physician (Ophthalmology)  Indicate any recent Medical Services you may have received from other than [facility] providers in the past year (date may be approximate).     Assessment:   This is a routine wellness examination for Nasir.  Hearing/Vision screen No results found.  Dietary issues and exercise activities discussed: Current Exercise Habits: The  patient does not participate in regular exercise at present, Exercise limited by: None identified   Goals Addressed   None   Depression Screen    12/16/2022    9:09 AM 12/16/2022    9:06 AM 12/12/2022    3:56 PM 08/30/2022    9:07 AM 05/29/2020   10:00 AM 05/14/2020    2:29 PM 01/09/2020    2:59 PM  PHQ 2/9 Scores  PHQ - 2 Score 0 0 0 0 0 0 0    Fall Risk    12/16/2022    9:06 AM 12/12/2022    3:56 PM 08/30/2022    9:06 AM 01/11/2021    1:23 PM 12/04/2020    3:39 PM  Fall Risk   Falls in the past year? 0 0 0 0 1  Number falls in past yr: 0 0 0 0 1  Injury with Fall? 0 0 0 0 1  Risk for fall due to : No Fall Risks No Fall Risks No Fall Risks    Follow up Falls evaluation completed Falls evaluation completed Falls evaluation completed      FALL RISK PREVENTION PERTAINING TO THE HOME:  Any stairs in or around the home? Yes  If so, are there any without handrails? Yes  Home free of loose throw rugs in walkways, pet beds, electrical cords, etc? yes Adequate lighting in your home to reduce risk of falls? Yes   ASSISTIVE DEVICES UTILIZED TO PREVENT FALLS:  Life alert? No  Use of a cane, walker or w/c? Yes  Grab bars in the bathroom? Yes  Shower chair or bench in shower? Yes  Elevated toilet seat or a handicapped toilet? No   TIMED  UP AND GO:  Was the test performed? Yes .  Length of time to ambulate 10 feet: 6 sec.   Gait slow and steady with assistive device  Cognitive Function:    05/29/2020   10:01 AM 05/28/2019   10:05 AM 05/22/2018    8:36 AM 09/19/2016    8:52 AM 08/13/2015    1:15 PM  MMSE - Mini Mental State Exam  Orientation to time 5 5 5 5 5   Orientation to time comments 2021, Fall, 05/29/2020, Friday, October.      Orientation to Place 4 5 4 5 4   Orientation to Place-comments State: [place], [place]: [place], [place]: [place].      Registration 3 3 3 3 3   Attention/ Calculation 4 0 5 1 5   Attention/Calculation-comments DROW      Recall 1 1  2 3 3   Recall-comments Only remembered "[employer]"      Language- name 2 objects 2 2 2 2 2   Language- repeat 1 1 1 1 1   Language- follow 3 step command 3 2 3 3 3   Language- read & follow direction 0 1 1 1 1   Language-read & follow direction-comments Only closed Eyes and didn't read it.      Write a sentence 1 1 1 1 1   Copy design 1 1 0 0 0  Total score 25 22 27 25 28         Immunizations Immunization History  Administered Date(s) Administered   Fluad Quad(high Dose 65+) 03/25/2019, 05/14/2020, 05/01/2022   Influenza, High Dose Seasonal PF 04/27/2017, 04/30/2018   Influenza,inj,Quad PF,6+ Mos 05/14/2015, 06/03/2016   Influenza-Unspecified 05/26/2011, 08/15/2012, 05/01/2013   PFIZER Comirnaty(Gray Top)Covid-19 Tri-Sucrose Vaccine 08/22/2019, 09/12/2019, 05/19/2020   Pneumococcal Conjugate-13 08/13/2015   Pneumococcal Polysaccharide-23 03/12/2009   Tdap 02/07/2013   Zoster, Live 01/03/2016    TDAP status: Up to date  Flu Vaccine status: Up to date  Pneumococcal vaccine status: Up to date  Covid-19 vaccine status: Completed vaccines  Qualifies for Shingles Vaccine? Yes   Zostavax completed No   Shingrix Completed?: No.    Education has been provided regarding the importance of this vaccine. Patient has been advised to call insurance company to determine out of pocket expense if they have not yet received this vaccine. Advised may also receive vaccine at local pharmacy or Health Dept. Verbalized acceptance and understanding.  Screening Tests Health Maintenance  Topic Date Due   Diabetic kidney evaluation - Urine ACR  Never done   COVID-19 Vaccine (4 - 2023-24 season) 04/01/2022   FOOT EXAM  10/12/2022   OPHTHALMOLOGY EXAM  11/24/2022   Zoster Vaccines- Shingrix (1 of 2) 03/14/2023 (Originally 06/24/1990)   DTaP/Tdap/Td (2 - Td or Tdap) 02/08/2023   HEMOGLOBIN A1C  02/28/2023   INFLUENZA VACCINE  03/02/2023   Diabetic kidney evaluation - eGFR measurement  08/31/2023    Medicare Annual Wellness (AWV)  12/16/2023   Pneumonia Vaccine 42+ Years old  Completed   HPV VACCINES  Aged Out    Health Maintenance  Health Maintenance Due  Topic Date Due   Diabetic kidney evaluation - Urine ACR  Never done   COVID-19 Vaccine (4 - 2023-24 season) 04/01/2022   FOOT EXAM  10/12/2022   OPHTHALMOLOGY EXAM  11/24/2022    Colorectal cancer screening: No longer required.   Lung Cancer Screening: (Low Dose CT Chest recommended if Age 54-80 years, 30 pack-year currently smoking OR have quit w/in 15years.) does not qualify.   Lung Cancer Screening Referral: n/a  Additional Screening:  Hepatitis C Screening: does qualify; Completed n/a  Vision Screening: Recommended annual ophthalmology exams for early detection of glaucoma and other disorders of the eye. Is the patient up to date with their annual eye exam?  Yes  Who is the provider or what is the name of the office in which the patient attends annual eye exams? Provider unknown If pt is not established with a provider, would they like to be referred to a provider to establish care? No .   Dental Screening: Recommended annual dental exams for proper oral hygiene  Community Resource Referral / Chronic Care Management: CRR required this visit?  No   CCM required this visit?  No      Plan:     I have personally reviewed and noted the following in the patient's chart:   Medical and social history Use of alcohol, tobacco or illicit drugs  Current medications and supplements including opioid prescriptions. Patient is not currently taking opioid prescriptions. Functional ability and status Nutritional status Physical activity Advanced directives List of other physicians Hospitalizations, surgeries, and ER visits in previous 12 months Vitals Screenings to include cognitive, depression, and falls Referrals and appointments  In addition, I have reviewed and discussed with patient certain preventive protocols,  quality metrics, and best practice recommendations. A written personalized care plan for preventive services as well as general preventive health recommendations were provided to patient.     Tamela Oddi, CMA   12/16/2022   Nurse Notes:  Mr. Connelley , Thank you for taking time to come for your Medicare Wellness Visit. I appreciate your ongoing commitment to your health goals. Please review the following plan we discussed and let me know if I can assist you in the future.   These are the goals we discussed:  Goals      <enter goal here>     Starting 09/19/16, I will maintain my current lifestyle.         This is a list of the screening recommended for you and due dates:  Health Maintenance  Topic Date Due   Yearly kidney health urinalysis for diabetes  Never done   COVID-19 Vaccine (4 - 2023-24 season) 04/01/2022   Complete foot exam   10/12/2022   Eye exam for diabetics  11/24/2022   Zoster (Shingles) Vaccine (1 of 2) 03/14/2023*   DTaP/Tdap/Td vaccine (2 - Td or Tdap) 02/08/2023   Hemoglobin A1C  02/28/2023   Flu Shot  03/02/2023   Yearly kidney function blood test for diabetes  08/31/2023   Medicare Annual Wellness Visit  12/16/2023   Pneumonia Vaccine  Completed   HPV Vaccine  Aged Out  *Topic was postponed. The date shown is not the original due date.

## 2022-12-16 NOTE — Patient Instructions (Signed)
Health Maintenance, Male Adopting a healthy lifestyle and getting preventive care are important in promoting health and wellness. Ask your health care provider about: The right schedule for you to have regular tests and exams. Things you can do on your own to prevent diseases and keep yourself healthy. What should I know about diet, weight, and exercise? Eat a healthy diet  Eat a diet that includes plenty of vegetables, fruits, low-fat dairy products, and lean protein. Do not eat a lot of foods that are high in solid fats, added sugars, or sodium. Maintain a healthy weight Body mass index (BMI) is a measurement that can be used to identify possible weight problems. It estimates body fat based on height and weight. Your health care provider can help determine your BMI and help you achieve or maintain a healthy weight. Get regular exercise Get regular exercise. This is one of the most important things you can do for your health. Most adults should: Exercise for at least 150 minutes each week. The exercise should increase your heart rate and make you sweat (moderate-intensity exercise). Do strengthening exercises at least twice a week. This is in addition to the moderate-intensity exercise. Spend less time sitting. Even light physical activity can be beneficial. Watch cholesterol and blood lipids Have your blood tested for lipids and cholesterol at 83 years of age, then have this test every 5 years. You may need to have your cholesterol levels checked more often if: Your lipid or cholesterol levels are high. You are older than 83 years of age. You are at high risk for heart disease. What should I know about cancer screening? Many types of cancers can be detected early and may often be prevented. Depending on your health history and family history, you may need to have cancer screening at various ages. This may include screening for: Colorectal cancer. Prostate cancer. Skin cancer. Lung  cancer. What should I know about heart disease, diabetes, and high blood pressure? Blood pressure and heart disease High blood pressure causes heart disease and increases the risk of stroke. This is more likely to develop in people who have high blood pressure readings or are overweight. Talk with your health care provider about your target blood pressure readings. Have your blood pressure checked: Every 3-5 years if you are 18-39 years of age. Every year if you are 40 years old or older. If you are between the ages of 65 and 75 and are a current or former smoker, ask your health care provider if you should have a one-time screening for abdominal aortic aneurysm (AAA). Diabetes Have regular diabetes screenings. This checks your fasting blood sugar level. Have the screening done: Once every three years after age 45 if you are at a normal weight and have a low risk for diabetes. More often and at a younger age if you are overweight or have a high risk for diabetes. What should I know about preventing infection? Hepatitis B If you have a higher risk for hepatitis B, you should be screened for this virus. Talk with your health care provider to find out if you are at risk for hepatitis B infection. Hepatitis C Blood testing is recommended for: Everyone born from 1945 through 1965. Anyone with known risk factors for hepatitis C. Sexually transmitted infections (STIs) You should be screened each year for STIs, including gonorrhea and chlamydia, if: You are sexually active and are younger than 83 years of age. You are older than 83 years of age and your   health care provider tells you that you are at risk for this type of infection. Your sexual activity has changed since you were last screened, and you are at increased risk for chlamydia or gonorrhea. Ask your health care provider if you are at risk. Ask your health care provider about whether you are at high risk for HIV. Your health care provider  may recommend a prescription medicine to help prevent HIV infection. If you choose to take medicine to prevent HIV, you should first get tested for HIV. You should then be tested every 3 months for as long as you are taking the medicine. Follow these instructions at home: Alcohol use Do not drink alcohol if your health care provider tells you not to drink. If you drink alcohol: Limit how much you have to 0-2 drinks a day. Know how much alcohol is in your drink. In the [place] one drink equals one 12 oz bottle of beer (355 mL), one 5 oz glass of wine (148 mL), or one 1 oz glass of hard liquor (44 mL). Lifestyle Do not use any products that contain nicotine or tobacco. These products include cigarettes, chewing tobacco, and vaping devices, such as e-cigarettes. If you need help quitting, ask your health care provider. Do not use street drugs. Do not share needles. Ask your health care provider for help if you need support or information about quitting drugs. General instructions Schedule regular health, dental, and eye exams. Stay current with your vaccines. Tell your health care provider if: You often feel depressed. You have ever been abused or do not feel safe at home. Summary Adopting a healthy lifestyle and getting preventive care are important in promoting health and wellness. Follow your health care provider's instructions about healthy diet, exercising, and getting tested or screened for diseases. Follow your health care provider's instructions on monitoring your cholesterol and blood pressure. This information is not intended to replace advice given to you by your health care provider. Make sure you discuss any questions you have with your health care provider. Document Revised: 12/07/2020 Document Reviewed: 12/07/2020 Elsevier Patient Education  2023 [employer] Inc[employer]  

## 2022-12-30 ENCOUNTER — Other Ambulatory Visit: Payer: Self-pay

## 2022-12-30 MED ORDER — TAMSULOSIN HCL 0.4 MG PO CAPS: 0.4000 mg | ORAL_CAPSULE | Freq: Every day | ORAL | 3 refills | Status: DC

## 2023-02-06 DIAGNOSIS — L821 Other seborrheic keratosis: Secondary | ICD-10-CM | POA: Diagnosis not present

## 2023-02-06 DIAGNOSIS — L308 Other specified dermatitis: Secondary | ICD-10-CM | POA: Diagnosis not present

## 2023-02-06 DIAGNOSIS — B078 Other viral warts: Secondary | ICD-10-CM | POA: Diagnosis not present

## 2023-02-22 ENCOUNTER — Encounter: Payer: Self-pay | Admitting: Emergency Medicine

## 2023-02-22 DIAGNOSIS — E1169 Type 2 diabetes mellitus with other specified complication: Secondary | ICD-10-CM

## 2023-02-22 DIAGNOSIS — E1159 Type 2 diabetes mellitus with other circulatory complications: Secondary | ICD-10-CM

## 2023-02-22 DIAGNOSIS — I152 Hypertension secondary to endocrine disorders: Secondary | ICD-10-CM

## 2023-02-22 DIAGNOSIS — N182 Chronic kidney disease, stage 2 (mild): Secondary | ICD-10-CM

## 2023-02-22 DIAGNOSIS — E1122 Type 2 diabetes mellitus with diabetic chronic kidney disease: Secondary | ICD-10-CM

## 2023-02-22 NOTE — Telephone Encounter (Signed)
Also add urine for microalbumin and creatinine.  Thanks.

## 2023-02-22 NOTE — Telephone Encounter (Signed)
Place order for UA/Micro.Marland KitchenRaechel Chute

## 2023-02-22 NOTE — Telephone Encounter (Signed)
Okay to order CBC, CMP, lipid profile, and hemoglobin A1c before visit next Monday.  Thanks.

## 2023-02-23 ENCOUNTER — Other Ambulatory Visit (INDEPENDENT_AMBULATORY_CARE_PROVIDER_SITE_OTHER): Payer: Medicare Other

## 2023-02-23 DIAGNOSIS — I152 Hypertension secondary to endocrine disorders: Secondary | ICD-10-CM | POA: Diagnosis not present

## 2023-02-23 DIAGNOSIS — E1122 Type 2 diabetes mellitus with diabetic chronic kidney disease: Secondary | ICD-10-CM | POA: Diagnosis not present

## 2023-02-23 DIAGNOSIS — E1169 Type 2 diabetes mellitus with other specified complication: Secondary | ICD-10-CM | POA: Diagnosis not present

## 2023-02-23 DIAGNOSIS — E1159 Type 2 diabetes mellitus with other circulatory complications: Secondary | ICD-10-CM

## 2023-02-23 DIAGNOSIS — E785 Hyperlipidemia, unspecified: Secondary | ICD-10-CM | POA: Diagnosis not present

## 2023-02-23 DIAGNOSIS — N182 Chronic kidney disease, stage 2 (mild): Secondary | ICD-10-CM

## 2023-02-23 LAB — URINALYSIS, ROUTINE W REFLEX MICROSCOPIC
Bilirubin Urine: NEGATIVE
Hgb urine dipstick: NEGATIVE
Ketones, ur: NEGATIVE
Leukocytes,Ua: NEGATIVE
Nitrite: NEGATIVE
RBC / HPF: NONE SEEN (ref 0–?)
Specific Gravity, Urine: 1.02 (ref 1.000–1.030)
Total Protein, Urine: 30 — AB
Urine Glucose: NEGATIVE
Urobilinogen, UA: 0.2 (ref 0.0–1.0)
pH: 6 (ref 5.0–8.0)

## 2023-02-23 LAB — LIPID PANEL
Cholesterol: 119 mg/dL (ref 0–200)
HDL: 48.3 mg/dL (ref >39.00)
LDL Cholesterol: 51 mg/dL (ref 0–99)
NonHDL: 70.96
Total CHOL/HDL Ratio: 2
Triglycerides: 102 mg/dL (ref 0.0–149.0)
VLDL: 20.4 mg/dL (ref 0.0–40.0)

## 2023-02-23 LAB — CBC WITH DIFFERENTIAL/PLATELET
Basophils Absolute: 0 10*3/uL (ref 0.0–0.1)
Basophils Relative: 0.6 % (ref 0.0–3.0)
Eosinophils Absolute: 0.2 10*3/uL (ref 0.0–0.7)
Eosinophils Relative: 3.6 % (ref 0.0–5.0)
HCT: 40.2 % (ref 39.0–52.0)
Hemoglobin: 13.2 g/dL (ref 13.0–17.0)
Lymphocytes Relative: 21 % (ref 12.0–46.0)
Lymphs Abs: 1.4 10*3/uL (ref 0.7–4.0)
MCHC: 32.7 g/dL (ref 30.0–36.0)
MCV: 87.2 fl (ref 78.0–100.0)
Monocytes Absolute: 0.7 10*3/uL (ref 0.1–1.0)
Monocytes Relative: 10.1 % (ref 3.0–12.0)
Neutro Abs: 4.5 10*3/uL (ref 1.4–7.7)
Neutrophils Relative %: 64.7 % (ref 43.0–77.0)
Platelets: 155 10*3/uL (ref 150.0–400.0)
RBC: 4.61 Mil/uL (ref 4.22–5.81)
RDW: 14.1 % (ref 11.5–15.5)
WBC: 6.9 10*3/uL (ref 4.0–10.5)

## 2023-02-23 LAB — COMPREHENSIVE METABOLIC PANEL
ALT: 12 U/L (ref 0–53)
AST: 18 U/L (ref 0–37)
Albumin: 4.1 g/dL (ref 3.5–5.2)
Alkaline Phosphatase: 67 U/L (ref 39–117)
BUN: 30 mg/dL — ABNORMAL HIGH (ref 6–23)
CO2: 31 mEq/L (ref 19–32)
Calcium: 9.3 mg/dL (ref 8.4–10.5)
Chloride: 102 mEq/L (ref 96–112)
Creatinine, Ser: 1.41 mg/dL (ref 0.40–1.50)
GFR: 46.36 mL/min — ABNORMAL LOW (ref >60.00)
Glucose, Bld: 135 mg/dL — ABNORMAL HIGH (ref 70–99)
Potassium: 5.3 mEq/L — ABNORMAL HIGH (ref 3.5–5.1)
Sodium: 138 mEq/L (ref 135–145)
Total Bilirubin: 0.8 mg/dL (ref 0.2–1.2)
Total Protein: 7.1 g/dL (ref 6.0–8.3)

## 2023-02-23 LAB — MICROALBUMIN / CREATININE URINE RATIO
Creatinine,U: 76.7 mg/dL
Microalb Creat Ratio: 22.5 mg/g (ref 0.0–30.0)
Microalb, Ur: 17.3 mg/dL — ABNORMAL HIGH (ref 0.0–1.9)

## 2023-02-23 LAB — HEMOGLOBIN A1C: Hgb A1c MFr Bld: 7.3 % — ABNORMAL HIGH (ref 4.6–6.5)

## 2023-02-27 ENCOUNTER — Encounter: Payer: Self-pay | Admitting: Emergency Medicine

## 2023-02-27 ENCOUNTER — Ambulatory Visit (INDEPENDENT_AMBULATORY_CARE_PROVIDER_SITE_OTHER): Payer: Medicare Other | Admitting: Emergency Medicine

## 2023-02-27 VITALS — BP 124/76 | HR 72 | Temp 97.7°F | Ht 68.0 in | Wt 189.4 lb

## 2023-02-27 DIAGNOSIS — I152 Hypertension secondary to endocrine disorders: Secondary | ICD-10-CM

## 2023-02-27 DIAGNOSIS — N1832 Chronic kidney disease, stage 3b: Secondary | ICD-10-CM | POA: Diagnosis not present

## 2023-02-27 DIAGNOSIS — E785 Hyperlipidemia, unspecified: Secondary | ICD-10-CM | POA: Diagnosis not present

## 2023-02-27 DIAGNOSIS — Z7984 Long term (current) use of oral hypoglycemic drugs: Secondary | ICD-10-CM | POA: Diagnosis not present

## 2023-02-27 DIAGNOSIS — E1169 Type 2 diabetes mellitus with other specified complication: Secondary | ICD-10-CM | POA: Diagnosis not present

## 2023-02-27 DIAGNOSIS — E1159 Type 2 diabetes mellitus with other circulatory complications: Secondary | ICD-10-CM | POA: Diagnosis not present

## 2023-02-27 DIAGNOSIS — E1122 Type 2 diabetes mellitus with diabetic chronic kidney disease: Secondary | ICD-10-CM | POA: Diagnosis not present

## 2023-02-27 DIAGNOSIS — N182 Chronic kidney disease, stage 2 (mild): Secondary | ICD-10-CM | POA: Diagnosis not present

## 2023-02-27 MED ORDER — EMPAGLIFLOZIN 10 MG PO TABS: 10.0000 mg | ORAL_TABLET | Freq: Every day | ORAL | 3 refills | Status: DC

## 2023-02-27 MED ORDER — TAMSULOSIN HCL 0.4 MG PO CAPS: 0.4000 mg | ORAL_CAPSULE | Freq: Every day | ORAL | 3 refills | Status: AC

## 2023-02-27 MED ORDER — SITAGLIPTIN PHOSPHATE 100 MG PO TABS
100.0000 mg | ORAL_TABLET | Freq: Every day | ORAL | 3 refills | Status: DC
Start: 2023-02-27 — End: 2024-02-28

## 2023-02-27 NOTE — Patient Instructions (Addendum)
Health Maintenance After Age 83 After age 83, you are at a higher risk for certain long-term diseases and infections as well as injuries from falls. Falls are a major cause of broken bones and head injuries in people who are older than age 83. Getting regular preventive care can help to keep you healthy and well. Preventive care includes getting regular testing and making lifestyle changes as recommended by your health care provider. Talk with your health care provider about: Which screenings and tests you should have. A screening is a test that checks for a disease when you have no symptoms. A diet and exercise plan that is right for you. What should I know about screenings and tests to prevent falls? Screening and testing are the best ways to find a health problem early. Early diagnosis and treatment give you the best chance of managing medical conditions that are common after age 83. Certain conditions and lifestyle choices may make you more likely to have a fall. Your health care provider may recommend: Regular vision checks. Poor vision and conditions such as cataracts can make you more likely to have a fall. If you wear glasses, make sure to get your prescription updated if your vision changes. Medicine review. Work with your health care provider to regularly review all of the medicines you are taking, including over-the-counter medicines. Ask your health care provider about any side effects that may make you more likely to have a fall. Tell your health care provider if any medicines that you take make you feel dizzy or sleepy. Strength and balance checks. Your health care provider may recommend certain tests to check your strength and balance while standing, walking, or changing positions. Foot health exam. Foot pain and numbness, as well as not wearing proper footwear, can make you more likely to have a fall. Screenings, including: Osteoporosis screening. Osteoporosis is a condition that causes  the bones to get weaker and break more easily. Blood pressure screening. Blood pressure changes and medicines to control blood pressure can make you feel dizzy. Depression screening. You may be more likely to have a fall if you have a fear of falling, feel depressed, or feel unable to do activities that you used to do. Alcohol use screening. Using too much alcohol can affect your balance and may make you more likely to have a fall. Follow these instructions at home: Lifestyle Do not drink alcohol if: Your health care provider tells you not to drink. If you drink alcohol: Limit how much you have to: 0-1 drink a day for women. 0-2 drinks a day for men. Know how much alcohol is in your drink. In the [place] one drink equals one 12 oz bottle of beer (355 mL), one 5 oz glass of wine (148 mL), or one 1 oz glass of hard liquor (44 mL). Do not use any products that contain nicotine or tobacco. These products include cigarettes, chewing tobacco, and vaping devices, such as e-cigarettes. If you need help quitting, ask your health care provider. Activity  Follow a regular exercise program to stay fit. This will help you maintain your balance. Ask your health care provider what types of exercise are appropriate for you. If you need a cane or walker, use it as recommended by your health care provider. Wear supportive shoes that have nonskid soles. Safety  Remove any tripping hazards, such as rugs, cords, and clutter. Install safety equipment such as grab bars in bathrooms and safety rails on stairs. Keep rooms and walkways   well-lit. General instructions Talk with your health care provider about your risks for falling. Tell your health care provider if: You fall. Be sure to tell your health care provider about all falls, even ones that seem minor. You feel dizzy, tiredness (fatigue), or off-balance. Take over-the-counter and prescription medicines only as told by your health care provider. These include  supplements. Eat a healthy diet and maintain a healthy weight. A healthy diet includes low-fat dairy products, low-fat (lean) meats, and fiber from whole grains, beans, and lots of fruits and vegetables. Stay current with your vaccines. Schedule regular health, dental, and eye exams. Summary Having a healthy lifestyle and getting preventive care can help to protect your health and wellness after age 83. Screening and testing are the best way to find a health problem early and help you avoid having a fall. Early diagnosis and treatment give you the best chance for managing medical conditions that are more common for people who are older than age 83. Falls are a major cause of broken bones and head injuries in people who are older than age 83. Take precautions to prevent a fall at home. Work with your health care provider to learn what changes you can make to improve your health and wellness and to prevent falls. This information is not intended to replace advice given to you by your health care provider. Make sure you discuss any questions you have with your health care provider. Document Revised: 12/07/2020 Document Reviewed: 12/07/2020 Elsevier Patient Education  2024 Elsevier Inc.  

## 2023-02-27 NOTE — Assessment & Plan Note (Signed)
GFR lower than before at 46 Recommend to stop metformin and start Jardiance 10 mg daily Advised to stay well-hydrated and avoid NSAIDs

## 2023-02-27 NOTE — Assessment & Plan Note (Signed)
Chronic stable condition Continue rosuvastatin 20 mg daily along with diabetic medications

## 2023-02-27 NOTE — Progress Notes (Signed)
Karenann Cai 82 y.o.   Chief Complaint  Patient presents with   Medical Management of Chronic Issues    f/u appt, no concerns, does patient need update covid shot?    Lab results     HISTORY OF PRESENT ILLNESS: This is a 82 y.o. male here for 77-month follow-up of chronic medical conditions. Overall doing well. Blood work done on 02/23/2023.  Results reviewed with patient. Has no complaints or any other medical concerns today.  HPI   Prior to Admission medications   Medication Sig Start Date End Date Taking? Authorizing Provider  docusate sodium (COLACE) 100 MG capsule Take 100 mg by mouth daily.   Yes [provider]  finasteride (PROSCAR) 5 MG tablet Take 1 tablet (5 mg total) by mouth daily. 12/16/22  Yes Whitney Bingaman, Eilleen Kempf, MD  glucose blood (ONETOUCH ULTRA) test strip USE TO CHECK BLOOD SUGARS ONCE DAILY AS DIRECTED FOR DIABETES 11/16/22  Yes Worth Kober, Eilleen Kempf, MD  Lancets [facility] DELICA PLUS [place]) MISC USE UP TO 4 TIMES A DAY AS DIRECTED 12/01/21  Yes Reed, Tiffany L, DO  metFORMIN (GLUCOPHAGE) 1000 MG tablet Take 1 tablet (1,000 mg total) by mouth 2 (two) times daily with a meal. 09/27/22  Yes Yuma Blucher, Eilleen Kempf, MD  rosuvastatin (CRESTOR) 20 MG tablet Take 1 tablet (20 mg total) by mouth daily. 12/16/22  Yes Ormond Lazo, Eilleen Kempf, MD  sitaGLIPtin (JANUVIA) 100 MG tablet Take 1 tablet (100 mg total) by mouth daily. 12/16/22  Yes Jahon Bart, Eilleen Kempf, MD  tamsulosin (FLOMAX) 0.4 MG CAPS capsule Take 1 capsule (0.4 mg total) by mouth daily. 12/30/22  Yes Georgina Quint, MD    No Known Allergies  Patient Active Problem List   Diagnosis Date Noted   Stage 3b chronic kidney disease (HCC) 08/30/2022   Hypertension associated with diabetes (HCC) 08/30/2022   Splenic artery aneurysm (HCC) 06/10/2019   Hearing loss of both ears due to cerumen impaction 06/10/2019   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   Seasonal allergies  03/25/2019   Tubular adenoma of colon 03/25/2014   Dyslipidemia associated with type 2 diabetes mellitus (HCC) 03/25/2014   Anemia, iron deficiency 03/25/2014   Essential hypertension, benign 03/25/2014   Hyperlipidemia 03/25/2014   Personal history of colon cancer, stage II 03/25/2014   Prostate nodule without urinary obstruction 03/25/2014    Past Medical History:  Diagnosis Date   Arthritis    Chronic kidney disease    CKD- Stage  II   Colon cancer (HCC)    2014   Colon polyps    adenomatous   Diabetes mellitus without complication (HCC)    Eczema    Hypercholesteremia    Hypertension     Past Surgical History:  Procedure Laterality Date   COLON SURGERY  2014 & 2015   resection of colon & removal of poylp ; Dr June Leap, Dr De Burrs   COLONOSCOPY  09/29/2013   COLONOSCOPY WITH PROPOFOL N/A 03/10/2015   Procedure: COLONOSCOPY WITH PROPOFOL;  Surgeon: Louis Meckel, MD;  Location: [facility];  Service: Endoscopy;  Laterality: N/A;   EXTERNAL EAR SURGERY  1954   FLEXIBLE SIGMOIDOSCOPY N/A 07/21/2014   Procedure: FLEXIBLE SIGMOIDOSCOPY;  Surgeon: Louis Meckel, MD;  Location: [facility];  Service: Endoscopy;  Laterality: N/A;    Social History   Socioeconomic History   Marital status: Single    Spouse name: Not on file   Number of children: Not on file  Years of education: Not on file   Highest education level: Not on file  Occupational History   Not on file  Tobacco Use   Smoking status: Never   Smokeless tobacco: Never  Vaping Use   Vaping status: Never Used  Substance and Sexual Activity   Alcohol use: Yes    Alcohol/week: 7.0 standard drinks of alcohol    Types: 7 Cans of beer per week    Comment: weekly   Drug use: No   Sexual activity: Not on file  Other Topics Concern   Not on file  Social History Narrative   Single, lives in 2 stories home with 3 people and 1 dog.  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, has Living Will &  POA (sister)   Right handed   Social Determinants of Health   Financial Resource Strain: Low Risk  (12/16/2022)   Overall Financial Resource Strain (CARDIA)    Difficulty of Paying Living Expenses: Not hard at all  Food Insecurity: No Food Insecurity (12/16/2022)   Hunger Vital Sign    Worried About Running Out of Food in the Last Year: Never true    Ran Out of Food in the Last Year: Never true  Transportation Needs: No Transportation Needs (12/16/2022)   PRAPARE - Administrator, Civil Service (Medical): No    Lack of Transportation (Non-Medical): No  Physical Activity: Inactive (12/16/2022)   Exercise Vital Sign    Days of Exercise per Week: 0 days    Minutes of Exercise per Session: 0 min  Stress: No Stress Concern Present (12/16/2022)   [employer] of Occupational Health - Occupational Stress Questionnaire    Feeling of Stress : Not at all  Social Connections: Moderately Isolated (12/16/2022)   Social Connection and Isolation Panel [NHANES]    Frequency of Communication with Friends and Family: More than three times a week    Frequency of Social Gatherings with Friends and Family: More than three times a week    Attends Religious Services: More than 4 times per year    Active Member of [employer] or Organizations: No    Attends Banker Meetings: Never    Marital Status: Never married  Intimate Partner Violence: Not At Risk (12/16/2022)   Humiliation, Afraid, Rape, and Kick questionnaire    Fear of Current or Ex-Partner: No    Emotionally Abused: No    Physically Abused: No    Sexually Abused: No    Family History  Problem Relation Age of Onset   Varicose Veins Mother    Stroke Mother    Glaucoma Mother    Kidney disease Mother    Hypertension Father    Heart attack Father    Cancer Brother        testicular cancer   Hyperlipidemia Sister    Meniere's disease Sister      Review of Systems  Constitutional: Negative.  Negative for chills and  fever.  HENT: Negative.  Negative for congestion and sore throat.   Respiratory: Negative.  Negative for cough and shortness of breath.   Cardiovascular: Negative.  Negative for chest pain and palpitations.  Gastrointestinal:  Negative for abdominal pain, diarrhea, nausea and vomiting.  Genitourinary: Negative.  Negative for dysuria and hematuria.  Skin: Negative.  Negative for rash.  Neurological: Negative.  Negative for dizziness and headaches.  All other systems reviewed and are negative.   Vitals:   02/27/23 0950  BP: 124/76  Pulse: 72  Temp: 97.7 F (36.5 C)  SpO2: 93%    Physical Exam Vitals reviewed.  Constitutional:      Appearance: Normal appearance.  HENT:     Head: Normocephalic.     Mouth/Throat:     Mouth: Mucous membranes are moist.     Pharynx: Oropharynx is clear.  Eyes:     Extraocular Movements: Extraocular movements intact.     Conjunctiva/sclera: Conjunctivae normal.     Pupils: Pupils are equal, round, and reactive to light.  Cardiovascular:     Rate and Rhythm: Normal rate and regular rhythm.     Pulses: Normal pulses.     Heart sounds: Normal heart sounds.  Pulmonary:     Effort: Pulmonary effort is normal.     Breath sounds: Normal breath sounds.  Abdominal:     Palpations: Abdomen is soft.     Tenderness: There is no abdominal tenderness.  Musculoskeletal:     Cervical back: No tenderness.     Right lower leg: No edema.     Left lower leg: No edema.  Lymphadenopathy:     Cervical: No cervical adenopathy.  Skin:    General: Skin is warm and dry.     Capillary Refill: Capillary refill takes less than 2 seconds.  Neurological:     Mental Status: He is alert and oriented to person, place, and time.  Psychiatric:        Mood and Affect: Mood normal.        Behavior: Behavior normal.      ASSESSMENT & PLAN: A total of 46 minutes was spent with the patient and counseling/coordination of care regarding preparing for this visit, review of  most recent office visit notes, review of multiple chronic medical conditions under management, review of most recent blood work results, review of all medications and changes made, education on nutrition, cardiovascular risks associated with hypertension and diabetes, prognosis, documentation, and need for follow-up.  Problem List Items Addressed This Visit       Cardiovascular and Mediastinum   Hypertension associated with diabetes (HCC) - Primary    BP Readings from Last 3 Encounters:  02/27/23 124/76  12/16/22 122/68  12/12/22 120/66  Well-controlled hypertension. Off medications Well-controlled diabetes with hemoglobin A1c of 7.3 GFR however continues to decrease. Recommend to stop metformin and start Jardiance 10 mg daily Continue Januvia 100 mg daily Diet and nutrition discussed Cardiovascular risks associated with hypertension and diabetes discussed. Follow-up in 6 months.       Relevant Medications   sitaGLIPtin (JANUVIA) 100 MG tablet   empagliflozin (JARDIANCE) 10 MG TABS tablet     Endocrine   Dyslipidemia associated with type 2 diabetes mellitus (HCC)    Chronic stable condition Continue rosuvastatin 20 mg daily along with diabetic medications      Relevant Medications   sitaGLIPtin (JANUVIA) 100 MG tablet   empagliflozin (JARDIANCE) 10 MG TABS tablet     Genitourinary   Stage 3b chronic kidney disease (HCC)    GFR lower than before at 46 Recommend to stop metformin and start Jardiance 10 mg daily Advised to stay well-hydrated and avoid NSAIDs      Other Visit Diagnoses     Type 2 diabetes mellitus with stage 2 chronic kidney disease, without long-term current use of insulin (HCC)       Relevant Medications   sitaGLIPtin (JANUVIA) 100 MG tablet   empagliflozin (JARDIANCE) 10 MG TABS tablet      Patient Instructions  Health Maintenance After Age 44 After age 70, you are at a higher risk for certain long-term diseases and infections as well as  injuries from falls. Falls are a major cause of broken bones and head injuries in people who are older than age 21. Getting regular preventive care can help to keep you healthy and well. Preventive care includes getting regular testing and making lifestyle changes as recommended by your health care provider. Talk with your health care provider about: Which screenings and tests you should have. A screening is a test that checks for a disease when you have no symptoms. A diet and exercise plan that is right for you. What should I know about screenings and tests to prevent falls? Screening and testing are the best ways to find a health problem early. Early diagnosis and treatment give you the best chance of managing medical conditions that are common after age 83. Certain conditions and lifestyle choices may make you more likely to have a fall. Your health care provider may recommend: Regular vision checks. Poor vision and conditions such as cataracts can make you more likely to have a fall. If you wear glasses, make sure to get your prescription updated if your vision changes. Medicine review. Work with your health care provider to regularly review all of the medicines you are taking, including over-the-counter medicines. Ask your health care provider about any side effects that may make you more likely to have a fall. Tell your health care provider if any medicines that you take make you feel dizzy or sleepy. Strength and balance checks. Your health care provider may recommend certain tests to check your strength and balance while standing, walking, or changing positions. Foot health exam. Foot pain and numbness, as well as not wearing proper footwear, can make you more likely to have a fall. Screenings, including: Osteoporosis screening. Osteoporosis is a condition that causes the bones to get weaker and break more easily. Blood pressure screening. Blood pressure changes and medicines to control blood  pressure can make you feel dizzy. Depression screening. You may be more likely to have a fall if you have a fear of falling, feel depressed, or feel unable to do activities that you used to do. Alcohol use screening. Using too much alcohol can affect your balance and may make you more likely to have a fall. Follow these instructions at home: Lifestyle Do not drink alcohol if: Your health care provider tells you not to drink. If you drink alcohol: Limit how much you have to: 0-1 drink a day for women. 0-2 drinks a day for men. Know how much alcohol is in your drink. In the [place] one drink equals one 12 oz bottle of beer (355 mL), one 5 oz glass of wine (148 mL), or one 1 oz glass of hard liquor (44 mL). Do not use any products that contain nicotine or tobacco. These products include cigarettes, chewing tobacco, and vaping devices, such as e-cigarettes. If you need help quitting, ask your health care provider. Activity  Follow a regular exercise program to stay fit. This will help you maintain your balance. Ask your health care provider what types of exercise are appropriate for you. If you need a cane or walker, use it as recommended by your health care provider. Wear supportive shoes that have nonskid soles. Safety  Remove any tripping hazards, such as rugs, cords, and clutter. Install safety equipment such as grab bars in bathrooms and safety rails on stairs. Keep rooms and  walkways well-lit. General instructions Talk with your health care provider about your risks for falling. Tell your health care provider if: You fall. Be sure to tell your health care provider about all falls, even ones that seem minor. You feel dizzy, tiredness (fatigue), or off-balance. Take over-the-counter and prescription medicines only as told by your health care provider. These include supplements. Eat a healthy diet and maintain a healthy weight. A healthy diet includes low-fat dairy products, low-fat (lean)  meats, and fiber from whole grains, beans, and lots of fruits and vegetables. Stay current with your vaccines. Schedule regular health, dental, and eye exams. Summary Having a healthy lifestyle and getting preventive care can help to protect your health and wellness after age 70. Screening and testing are the best way to find a health problem early and help you avoid having a fall. Early diagnosis and treatment give you the best chance for managing medical conditions that are more common for people who are older than age 67. Falls are a major cause of broken bones and head injuries in people who are older than age 46. Take precautions to prevent a fall at home. Work with your health care provider to learn what changes you can make to improve your health and wellness and to prevent falls. This information is not intended to replace advice given to you by your health care provider. Make sure you discuss any questions you have with your health care provider. Document Revised: 12/07/2020 Document Reviewed: 12/07/2020 Elsevier Patient Education  2024 Elsevier Inc.      Edwina Barth, MD [facility]

## 2023-02-27 NOTE — Assessment & Plan Note (Signed)
BP Readings from Last 3 Encounters:  02/27/23 124/76  12/16/22 122/68  12/12/22 120/66  Well-controlled hypertension. Off medications Well-controlled diabetes with hemoglobin A1c of 7.3 GFR however continues to decrease. Recommend to stop metformin and start Jardiance 10 mg daily Continue Januvia 100 mg daily Diet and nutrition discussed Cardiovascular risks associated with hypertension and diabetes discussed. Follow-up in 6 months.

## 2023-03-13 ENCOUNTER — Encounter: Payer: Self-pay | Admitting: Podiatry

## 2023-03-13 ENCOUNTER — Ambulatory Visit (INDEPENDENT_AMBULATORY_CARE_PROVIDER_SITE_OTHER): Payer: Medicare Other | Admitting: Podiatry

## 2023-03-13 DIAGNOSIS — N182 Chronic kidney disease, stage 2 (mild): Secondary | ICD-10-CM

## 2023-03-13 DIAGNOSIS — E1122 Type 2 diabetes mellitus with diabetic chronic kidney disease: Secondary | ICD-10-CM | POA: Diagnosis not present

## 2023-03-13 DIAGNOSIS — M79675 Pain in left toe(s): Secondary | ICD-10-CM

## 2023-03-13 DIAGNOSIS — B351 Tinea unguium: Secondary | ICD-10-CM | POA: Diagnosis not present

## 2023-03-13 DIAGNOSIS — M79674 Pain in right toe(s): Secondary | ICD-10-CM | POA: Diagnosis not present

## 2023-03-13 NOTE — Progress Notes (Signed)
  Subjective:  Patient ID: Justin [name], male    DOB: 06/13/1941,   MRN: [id number]  Chief Complaint  Patient presents with   Diabetes    Patient is here for [facility]    82 y.o. male presents for concern of thickened elongated and painful nails that are difficult to trim. Requesting to have them trimmed today. Relates burning and tingling in their feet. Patient is diabetic and last A1c was  Lab Results  Component Value Date   HGBA1C 7.3 (H) 02/23/2023   .   PCP:  Georgina Quint, MD    . Denies any other pedal complaints. Denies n/v/f/c.   Past Medical History:  Diagnosis Date   Arthritis    Chronic kidney disease    CKD- Stage  II   Colon cancer (HCC)    2014   Colon polyps    adenomatous   Diabetes mellitus without complication (HCC)    Eczema    Hypercholesteremia    Hypertension     Objective:  Physical Exam: Vascular: DP/PT pulses 2/4 bilateral. CFT <3 seconds. Absent hair growth on digits. Edema noted to bilateral lower extremities. Xerosis noted bilaterally.  Skin. No lacerations or abrasions bilateral feet. Nails 1-5 bilateral  are thickened discolored and elongated with subungual debris.  Musculoskeletal: MMT 5/5 bilateral lower extremities in DF, PF, Inversion and Eversion. Deceased ROM in DF of ankle joint.  Neurological: Sensation intact to light touch. Protective sensation diminished bilateral.    Assessment:  No diagnosis found.   Plan:  Patient was evaluated and treated and all questions answered. -Discussed and educated patient on diabetic foot care, especially with  regards to the vascular, neurological and musculoskeletal systems.  -Stressed the importance of good glycemic control and the detriment of not  controlling glucose levels in relation to the foot. -Discussed supportive shoes at all times and checking feet regularly.  -Mechanically debrided all nails 1-5 bilateral using sterile nail nipper and filed with dremel without incident   -Answered all patient questions -Patient to return  in 3 months for at risk foot care -Patient advised to call the office if any problems or questions arise in the meantime.   Louann Sjogren, DPM

## 2023-03-24 ENCOUNTER — Other Ambulatory Visit: Payer: Self-pay | Admitting: Emergency Medicine

## 2023-03-24 DIAGNOSIS — E1122 Type 2 diabetes mellitus with diabetic chronic kidney disease: Secondary | ICD-10-CM

## 2023-06-13 ENCOUNTER — Ambulatory Visit (INDEPENDENT_AMBULATORY_CARE_PROVIDER_SITE_OTHER): Payer: Medicare Other | Admitting: Podiatry

## 2023-06-13 ENCOUNTER — Encounter: Payer: Self-pay | Admitting: Podiatry

## 2023-06-13 DIAGNOSIS — M2011 Hallux valgus (acquired), right foot: Secondary | ICD-10-CM | POA: Diagnosis not present

## 2023-06-13 DIAGNOSIS — E1122 Type 2 diabetes mellitus with diabetic chronic kidney disease: Secondary | ICD-10-CM

## 2023-06-13 DIAGNOSIS — M2012 Hallux valgus (acquired), left foot: Secondary | ICD-10-CM

## 2023-06-13 DIAGNOSIS — B351 Tinea unguium: Secondary | ICD-10-CM

## 2023-06-13 DIAGNOSIS — M79675 Pain in left toe(s): Secondary | ICD-10-CM | POA: Diagnosis not present

## 2023-06-13 DIAGNOSIS — N182 Chronic kidney disease, stage 2 (mild): Secondary | ICD-10-CM | POA: Diagnosis not present

## 2023-06-13 DIAGNOSIS — S91114A Laceration without foreign body of right lesser toe(s) without damage to nail, initial encounter: Secondary | ICD-10-CM | POA: Diagnosis not present

## 2023-06-13 DIAGNOSIS — E119 Type 2 diabetes mellitus without complications: Secondary | ICD-10-CM | POA: Diagnosis not present

## 2023-06-13 DIAGNOSIS — M79674 Pain in right toe(s): Secondary | ICD-10-CM | POA: Diagnosis not present

## 2023-06-18 NOTE — Progress Notes (Signed)
ANNUAL DIABETIC FOOT EXAM  Subjective: Justin [name] presents today for annual diabetic foot exam. He states he attempted to trim his toenails and cut the right 2nd toe. Chief Complaint  Patient presents with   Diabetes    DFC A1C : 170 PATIENT STATES HE SAW HIS PCP ABOUT THREE MONTHS AGO   PATIENT STATES HE CUT HIS RF 2ND TOE ON THE TIP OF HIS TOE    Patient confirms h/o diabetes.  Patient has been diagnosed with neuropathy.  Georgina Quint, MD is patient's PCP. LOV 02/27/2023.  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   Hearing loss of both ears due to cerumen impaction 06/10/2019   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   Seasonal allergies 03/25/2019   Tubular adenoma of colon 03/25/2014   Dyslipidemia associated with type 2 diabetes mellitus (HCC) 03/25/2014   Anemia, iron deficiency 03/25/2014   Essential hypertension, benign 03/25/2014   Hyperlipidemia 03/25/2014   Personal history of colon cancer, stage II 03/25/2014   Prostate nodule without urinary obstruction 03/25/2014   Past Surgical History:  Procedure Laterality Date   COLON SURGERY  2014 & 2015   resection of colon & removal of poylp ; Dr June Leap, Dr De Burrs   COLONOSCOPY  09/29/2013   COLONOSCOPY WITH PROPOFOL N/A 03/10/2015   Procedure: COLONOSCOPY WITH PROPOFOL;  Surgeon: Louis Meckel, MD;  Location: [facility];  Service: Endoscopy;  Laterality: N/A;   EXTERNAL EAR SURGERY  1954   FLEXIBLE SIGMOIDOSCOPY N/A 07/21/2014   Procedure: FLEXIBLE SIGMOIDOSCOPY;   Surgeon: Louis Meckel, MD;  Location: [facility];  Service: Endoscopy;  Laterality: N/A;   Current Outpatient Medications on File Prior to Visit  Medication Sig Dispense Refill   docusate sodium (COLACE) 100 MG capsule Take 100 mg by mouth daily.     empagliflozin (JARDIANCE) 10 MG TABS tablet Take 1 tablet (10 mg total) by mouth daily. 90 tablet 3   finasteride (PROSCAR) 5 MG tablet Take 1 tablet (5 mg total) by mouth daily. 90 tablet 3   glucose blood (ONETOUCH ULTRA) test strip USE TO CHECK BLOOD SUGARS ONCE DAILY AS DIRECTED FOR DIABETES 50 strip 10   Lancets (ONETOUCH DELICA PLUS LANCET33G) MISC USE UP TO 4 TIMES A DAY AS DIRECTED 100 each 2   rosuvastatin (CRESTOR) 20 MG tablet Take 1 tablet (20 mg total) by mouth daily. 90 tablet 3   sitaGLIPtin (JANUVIA) 100 MG tablet Take 1 tablet (100 mg total) by mouth daily. 90 tablet 3   tamsulosin (FLOMAX) 0.4 MG CAPS capsule Take 1 capsule (0.4 mg total) by mouth daily. 90 capsule 3   triamcinolone cream (KENALOG) 0.1 % SMARTSIG:1 Application Topical 2-3 Times Daily     No current facility-administered medications on file prior to visit.    No Known Allergies Social History   Occupational History   Not on file  Tobacco Use   Smoking status: Never   Smokeless tobacco: Never  Vaping Use  Vaping status: Never Used  Substance and Sexual Activity   Alcohol use: Yes    Alcohol/week: 7.0 standard drinks of alcohol    Types: 7 Cans of beer per week    Comment: weekly   Drug use: No   Sexual activity: Not on file   Family History  Problem Relation Age of Onset   Varicose Veins Mother    Stroke Mother    Glaucoma Mother    Kidney disease Mother    Hypertension Father    Heart attack Father    Cancer Brother        testicular cancer   Hyperlipidemia Sister    Meniere's disease Sister    Immunization History  Administered Date(s) Administered   Fluad Quad(high Dose 65+) 03/25/2019, 05/14/2020, 05/01/2022   Influenza, High  Dose Seasonal PF 04/27/2017, 04/30/2018   Influenza,inj,Quad PF,6+ Mos 05/14/2015, 06/03/2016   Influenza-Unspecified 05/26/2011, 08/15/2012, 05/01/2013   PFIZER Comirnaty(Gray Top)Covid-19 Tri-Sucrose Vaccine 08/22/2019, 09/12/2019, 05/19/2020   Pneumococcal Conjugate-13 08/13/2015   Pneumococcal Polysaccharide-23 03/12/2009   Tdap 02/07/2013   Zoster, Live 01/03/2016     Review of Systems: Negative except as noted in the HPI.   Objective: There were no vitals filed for this visit.  Justin [name] is a pleasant 82 y.o. male in NAD. AAO X 3.  Title   Diabetic Foot Exam - detailed Date & Time: 06/13/2023 10:00 AM Diabetic Foot exam was performed with the following findings: Yes  Visual Foot Exam completed.: Yes  Is there a history of foot ulcer?: No Is there a foot ulcer now?: No Is there swelling?: No Is there elevated skin temperature?: No Is there abnormal foot shape?: Yes (Comment: HAV with bunion deformity b/l) Is there a claw toe deformity?: No Are the toenails long?: Yes Are the toenails thick?: Yes Are the toenails ingrown?: No Is the skin thin, fragile, shiny and hairless?": No Normal Range of Motion?: Yes Is there foot or ankle muscle weakness?: No Do you have pain in calf while walking?: No Are the shoes appropriate in style and fit?: Yes Can the patient see the bottom of their feet?: No Pulse Foot Exam completed.: Yes   Right Posterior Tibialis: Present Left posterior Tibialis: Present   Right Dorsalis Pedis: Present Left Dorsalis Pedis: Present     Sensory Foot Exam Completed.: Yes Semmes-Weinstein Monofilament Test "+" means "has sensation" and "-" means "no sensation"  R Foot Test Control: Pos L Foot Test Control: Pos   R Site 1-Great Toe: Pos L Site 1-Great Toe: Pos   R Site 4: Pos L Site 4: Pos   R site 5: Pos L Site 5: Pos  R Site 6: Pos L Site 6: Pos     Image components are not supported.   Image components are not supported. Image components  are not supported.  T[place] Right vibratory: present Left vibratory: present  Comments Pedal hair absent b/l. Healing laceration right 2nd digit. No erythema, no edema, no drainage, no fluctuance.     Lab Results  Component Value Date   HGBA1C 7.3 (H) 02/23/2023   ADA Risk Categorization: Low Risk :  Patient has all of the following: Intact protective sensation No prior foot ulcer  No severe deformity Pedal pulses present  Assessment: 1. 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 Hallux valgus, acquired, bilateral   3. Laceration of second toe of right foot, initial encounter   4. Type 2 diabetes mellitus with stage  2 chronic kidney disease, without long-term current use of insulin (HCC)   5. Encounter for diabetic foot exam [facility])     Plan: -Patient was evaluated today. All questions/concerns addressed on today's visit. -Laceration right 2nd toe healing. Apply Neosporin once daily for one week . -Diabetic foot examination performed today. -Continue diabetic foot care principles: inspect feet daily, monitor glucose as recommended by PCP and/or Endocrinologist, and follow prescribed diet per PCP, Endocrinologist and/or dietician. -Patient to continue soft, supportive shoe gear daily. -Toenails 1-5 b/l were debrided in length and girth with sterile nail nippers and dremel without iatrogenic bleeding.  -Patient/POA to call should there be question/concern in the interim. Return in about 3 months (around 09/13/2023).  Freddie Breech, DPM

## 2023-07-17 ENCOUNTER — Encounter: Payer: Self-pay | Admitting: Emergency Medicine

## 2023-08-08 ENCOUNTER — Encounter: Payer: Self-pay | Admitting: Emergency Medicine

## 2023-08-08 ENCOUNTER — Ambulatory Visit: Payer: Medicare Other | Admitting: Emergency Medicine

## 2023-08-08 VITALS — BP 114/68 | HR 86 | Temp 97.4°F | Ht 68.0 in | Wt 192.0 lb

## 2023-08-08 DIAGNOSIS — H6123 Impacted cerumen, bilateral: Secondary | ICD-10-CM

## 2023-08-08 DIAGNOSIS — E1159 Type 2 diabetes mellitus with other circulatory complications: Secondary | ICD-10-CM

## 2023-08-08 DIAGNOSIS — E1169 Type 2 diabetes mellitus with other specified complication: Secondary | ICD-10-CM

## 2023-08-08 DIAGNOSIS — E785 Hyperlipidemia, unspecified: Secondary | ICD-10-CM | POA: Diagnosis not present

## 2023-08-08 DIAGNOSIS — I152 Hypertension secondary to endocrine disorders: Secondary | ICD-10-CM | POA: Diagnosis not present

## 2023-08-08 DIAGNOSIS — N1832 Chronic kidney disease, stage 3b: Secondary | ICD-10-CM | POA: Diagnosis not present

## 2023-08-08 NOTE — Progress Notes (Signed)
 Justin [name] 83 y.o.   Chief Complaint  Patient presents with   Ear Pain    Having hearing problems for the last month. Pt c/o of sinus stuff, no sore throat. Right shoulder is bothering pt     HISTORY OF PRESENT ILLNESS: This is a 83 y.o. male accompanied by wife complaining of bilateral hearing problems and some discomfort to the right shoulder area for several weeks No other complaints or medical concerns today. History of diabetes and chronic kidney disease. Was recently seen by dermatologist for several multiple skin lesions.  Benign lesions.  HPI   Prior to Admission medications   Medication Sig Start Date End Date Taking? Authorizing Provider  docusate sodium  (COLACE) 100 MG capsule Take 100 mg by mouth daily.   Yes [provider]  empagliflozin  (JARDIANCE ) 10 MG TABS tablet Take 1 tablet (10 mg total) by mouth daily. 02/27/23  Yes Jeramyah Goodpasture, Justin Schanz, MD  finasteride  (PROSCAR ) 5 MG tablet Take 1 tablet (5 mg total) by mouth daily. 12/16/22  Yes Jazara Swiney, Justin Schanz, MD  glucose blood (ONETOUCH ULTRA) test strip USE TO CHECK BLOOD SUGARS ONCE DAILY AS DIRECTED FOR DIABETES 11/16/22  Yes Asucena Galer, Justin Schanz, MD  Lancets [facility] DELICA PLUS LANCET33G) MISC USE UP TO 4 TIMES A DAY AS DIRECTED 12/01/21  Yes Reed, Tiffany L, DO  rosuvastatin  (CRESTOR ) 20 MG tablet Take 1 tablet (20 mg total) by mouth daily. 12/16/22  Yes Josephmichael Lisenbee, Justin Schanz, MD  sitaGLIPtin  (JANUVIA ) 100 MG tablet Take 1 tablet (100 mg total) by mouth daily. 02/27/23  Yes Genella Bas, Justin Schanz, MD  tamsulosin  (FLOMAX ) 0.4 MG CAPS capsule Take 1 capsule (0.4 mg total) by mouth daily. 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   Hearing loss of both ears due to cerumen impaction 06/10/2019   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   Seasonal allergies 03/25/2019   Tubular adenoma of colon 03/25/2014   Dyslipidemia associated with type 2 diabetes mellitus (HCC) 03/25/2014   Anemia, iron deficiency 03/25/2014   Essential hypertension, benign 03/25/2014   Hyperlipidemia 03/25/2014   Personal history of colon cancer, stage II 03/25/2014   Prostate nodule without urinary obstruction 03/25/2014    Past Medical History:  Diagnosis Date   Arthritis    Chronic kidney disease    CKD- Stage  II   Colon cancer (HCC)    2014   Colon polyps    adenomatous   Diabetes mellitus without complication (HCC)    Eczema    Hypercholesteremia    Hypertension     Past Surgical History:  Procedure Laterality Date   COLON SURGERY  2014 & 2015   resection of colon & removal of poylp ; Dr Clive, Dr Rick   COLONOSCOPY  09/29/2013   COLONOSCOPY WITH PROPOFOL  N/A 03/10/2015   Procedure: COLONOSCOPY WITH PROPOFOL ;  Surgeon: Lamar JONETTA Aho, MD;  Location: [facility];  Service: Endoscopy;  Laterality: N/A;   EXTERNAL EAR SURGERY  1954   FLEXIBLE SIGMOIDOSCOPY N/A 07/21/2014   Procedure: FLEXIBLE SIGMOIDOSCOPY;  Surgeon: Lamar JONETTA Aho, MD;  Location: [facility];  Service: Endoscopy;  Laterality: N/A;  Social History   Socioeconomic History   Marital status: Single    Spouse name: Not on file   Number of children: Not on file   Years of education: Not on file   Highest education level: Not on file  Occupational History   Not on file  Tobacco Use   Smoking status: Never   Smokeless tobacco: Never  Vaping Use   Vaping status: Never Used  Substance and Sexual Activity   Alcohol use: Yes    Alcohol/week: 7.0 standard drinks of alcohol    Types: 7 Cans of beer per week    Comment: weekly   Drug use: No   Sexual activity:  Not on file  Other Topics Concern   Not on file  Social History Narrative   Single, lives in 2 stories home with 3 people and 1 dog.  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[employer], has Living Will & POA (sister)   Right handed   Social Drivers of Health   Financial Resource Strain: Low Risk  (12/16/2022)   Overall Financial Resource Strain (CARDIA)    Difficulty of Paying Living Expenses: Not hard at all  Food Insecurity: No Food Insecurity (12/16/2022)   Hunger Vital Sign    Worried About Running Out of Food in the Last Year: Never true    Ran Out of Food in the Last Year: Never true  Transportation Needs: No Transportation Needs (12/16/2022)   PRAPARE - Administrator, Civil Service (Medical): No    Lack of Transportation (Non-Medical): No  Physical Activity: Inactive (12/16/2022)   Exercise Vital Sign    Days of Exercise per Week: 0 days    Minutes of Exercise per Session: 0 min  Stress: No Stress Concern Present (12/16/2022)   [employer] of Occupational Health - Occupational Stress Questionnaire    Feeling of Stress : Not at all  Social Connections: Moderately Isolated (12/16/2022)   Social Connection and Isolation Panel [NHANES]    Frequency of Communication with Friends and Family: More than three times a week    Frequency of Social Gatherings with Friends and Family: More than three times a week    Attends Religious Services: More than 4 times per year    Active Member of [employer] or Organizations: No    Attends Banker Meetings: Never    Marital Status: Never married  Intimate Partner Violence: Not At Risk (12/16/2022)   Humiliation, Afraid, Rape, and Kick questionnaire    Fear of Current or Ex-Partner: No    Emotionally Abused: No    Physically Abused: No    Sexually Abused: No    Family History  Problem Relation Age of Onset   Varicose Veins Mother    Stroke Mother    Glaucoma Mother    Kidney disease Mother     Hypertension Father    Heart attack Father    Cancer Brother        testicular cancer   Hyperlipidemia Sister    Meniere's disease Sister      Review of Systems  Constitutional: Negative.  Negative for chills and fever.  HENT:  Positive for hearing loss.   Respiratory:  Negative for cough and shortness of breath.   Cardiovascular: Negative.  Negative for chest pain and palpitations.  Gastrointestinal:  Negative for abdominal pain, nausea and vomiting.  Genitourinary: Negative.  Negative for dysuria and hematuria.  Neurological: Negative.  Negative for dizziness and headaches.  All  other systems reviewed and are negative.   Today's Vitals   08/08/23 1357  BP: 114/68  Pulse: 86  Temp: (!) 97.4 F (36.3 C)  TempSrc: Oral  SpO2: 95%  Weight: 192 lb (87.1 kg)  Height: 5' 8 (1.727 m)   Body mass index is 29.19 kg/m.   Physical Exam Vitals reviewed.  Constitutional:      Appearance: Normal appearance.  HENT:     Right Ear: There is impacted cerumen.     Left Ear: There is impacted cerumen.  Eyes:     Extraocular Movements: Extraocular movements intact.  Cardiovascular:     Rate and Rhythm: Normal rate and regular rhythm.     Pulses: Normal pulses.     Heart sounds: Normal heart sounds.  Pulmonary:     Effort: Pulmonary effort is normal.     Breath sounds: Normal breath sounds.  Abdominal:     Palpations: Abdomen is soft.     Tenderness: There is no abdominal tenderness.  Skin:    General: Skin is warm and dry.     Capillary Refill: Capillary refill takes less than 2 seconds.  Neurological:     General: No focal deficit present.     Mental Status: He is alert and oriented to person, place, and time.  Psychiatric:        Mood and Affect: Mood normal.        Behavior: Behavior normal.    Procedure note: Ceruminosis is noted.  After obtaining verbal consent wax is removed by syringing and manual debridement.  Tolerated procedure well.  No complications.   Instructions for home care to prevent wax buildup are given.   ASSESSMENT & PLAN: A total of 44 minutes was spent with the patient and counseling/coordination of care regarding preparing for this visit, review of most recent office visit notes, review of multiple chronic medical conditions and their management, review of all medications, review of most recent bloodwork results, review of health maintenance items, education on nutrition, prognosis, documentation, and need for follow up.   Problem List Items Addressed This Visit       Cardiovascular and Mediastinum   Hypertension associated with diabetes (HCC)   Well-controlled hypertension. Off medications Well-controlled diabetes with hemoglobin A1c of 7.3 Continue Jardiance  10 mg Continue Januvia  100 mg daily Diet and nutrition discussed Cardiovascular risks associated with hypertension and diabetes discussed. Follow-up in 6 months.        Endocrine   Dyslipidemia associated with type 2 diabetes mellitus (HCC)   Chronic stable condition Continue rosuvastatin  20 mg daily along with diabetic medications        Nervous and Auditory   Hearing loss of both ears due to cerumen impaction - Primary   Both ears irrigated and  obstruction relieved Earwax irrigated out bilaterally Hearing improved after procedure. No complications.  Tolerated procedure well.      Relevant Orders   Ear Lavage     Genitourinary   Stage 3b chronic kidney disease (HCC)   Continue Jardiance  10 mg daily Advised to stay well-hydrated and avoid NSAIDs      Patient Instructions  Health Maintenance After Age 67 After age 46, you are at a higher risk for certain long-term diseases and infections as well as injuries from falls. Falls are a major cause of broken bones and head injuries in people who are older than age 26. Getting regular preventive care can help to keep you healthy and well. Preventive care includes getting regular  testing and making  lifestyle changes as recommended by your health care provider. Talk with your health care provider about: Which screenings and tests you should have. A screening is a test that checks for a disease when you have no symptoms. A diet and exercise plan that is right for you. What should I know about screenings and tests to prevent falls? Screening and testing are the best ways to find a health problem early. Early diagnosis and treatment give you the best chance of managing medical conditions that are common after age 61. Certain conditions and lifestyle choices may make you more likely to have a fall. Your health care provider may recommend: Regular vision checks. Poor vision and conditions such as cataracts can make you more likely to have a fall. If you wear glasses, make sure to get your prescription updated if your vision changes. Medicine review. Work with your health care provider to regularly review all of the medicines you are taking, including over-the-counter medicines. Ask your health care provider about any side effects that may make you more likely to have a fall. Tell your health care provider if any medicines that you take make you feel dizzy or sleepy. Strength and balance checks. Your health care provider may recommend certain tests to check your strength and balance while standing, walking, or changing positions. Foot health exam. Foot pain and numbness, as well as not wearing proper footwear, can make you more likely to have a fall. Screenings, including: Osteoporosis screening. Osteoporosis is a condition that causes the bones to get weaker and break more easily. Blood pressure screening. Blood pressure changes and medicines to control blood pressure can make you feel dizzy. Depression screening. You may be more likely to have a fall if you have a fear of falling, feel depressed, or feel unable to do activities that you used to do. Alcohol use screening. Using too much alcohol can  affect your balance and may make you more likely to have a fall. Follow these instructions at home: Lifestyle Do not drink alcohol if: Your health care provider tells you not to drink. If you drink alcohol: Limit how much you have to: 0-1 drink a day for women. 0-2 drinks a day for men. Know how much alcohol is in your drink. In the [place] one drink equals one 12 oz bottle of beer (355 mL), one 5 oz glass of wine (148 mL), or one 1 oz glass of hard liquor (44 mL). Do not use any products that contain nicotine or tobacco. These products include cigarettes, chewing tobacco, and vaping devices, such as e-cigarettes. If you need help quitting, ask your health care provider. Activity  Follow a regular exercise program to stay fit. This will help you maintain your balance. Ask your health care provider what types of exercise are appropriate for you. If you need a cane or walker, use it as recommended by your health care provider. Wear supportive shoes that have nonskid soles. Safety  Remove any tripping hazards, such as rugs, cords, and clutter. Install safety equipment such as grab bars in bathrooms and safety rails on stairs. Keep rooms and walkways well-lit. General instructions Talk with your health care provider about your risks for falling. Tell your health care provider if: You fall. Be sure to tell your health care provider about all falls, even ones that seem minor. You feel dizzy, tiredness (fatigue), or off-balance. Take over-the-counter and prescription medicines only as told by your health care provider. These include supplements.  Eat a healthy diet and maintain a healthy weight. A healthy diet includes low-fat dairy products, low-fat (lean) meats, and fiber from whole grains, beans, and lots of fruits and vegetables. Stay current with your vaccines. Schedule regular health, dental, and eye exams. Summary Having a healthy lifestyle and getting preventive care can help to protect  your health and wellness after age 50. Screening and testing are the best way to find a health problem early and help you avoid having a fall. Early diagnosis and treatment give you the best chance for managing medical conditions that are more common for people who are older than age 23. Falls are a major cause of broken bones and head injuries in people who are older than age 25. Take precautions to prevent a fall at home. Work with your health care provider to learn what changes you can make to improve your health and wellness and to prevent falls. This information is not intended to replace advice given to you by your health care provider. Make sure you discuss any questions you have with your health care provider. Document Revised: 12/07/2020 Document Reviewed: 12/07/2020 Elsevier Patient Education  2024 Elsevier Inc.     Justin Schaumann, MD [facility]

## 2023-08-08 NOTE — Assessment & Plan Note (Signed)
Chronic stable condition Continue rosuvastatin 20 mg daily along with diabetic medications

## 2023-08-08 NOTE — Assessment & Plan Note (Signed)
 Continue Jardiance 10 mg daily Advised to stay well-hydrated and avoid NSAIDs

## 2023-08-08 NOTE — Assessment & Plan Note (Signed)
 Well-controlled hypertension. Off medications Well-controlled diabetes with hemoglobin A1c of 7.3 Continue Jardiance  10 mg Continue Januvia  100 mg daily Diet and nutrition discussed Cardiovascular risks associated with hypertension and diabetes discussed. Follow-up in 6 months.

## 2023-08-08 NOTE — Patient Instructions (Signed)
 Health Maintenance After Age 84 After age 27, you are at a higher risk for certain long-term diseases and infections as well as injuries from falls. Falls are a major cause of broken bones and head injuries in people who are older than age 73. Getting regular preventive care can help to keep you healthy and well. Preventive care includes getting regular testing and making lifestyle changes as recommended by your health care provider. Talk with your health care provider about: Which screenings and tests you should have. A screening is a test that checks for a disease when you have no symptoms. A diet and exercise plan that is right for you. What should I know about screenings and tests to prevent falls? Screening and testing are the best ways to find a health problem early. Early diagnosis and treatment give you the best chance of managing medical conditions that are common after age 90. Certain conditions and lifestyle choices may make you more likely to have a fall. Your health care provider may recommend: Regular vision checks. Poor vision and conditions such as cataracts can make you more likely to have a fall. If you wear glasses, make sure to get your prescription updated if your vision changes. Medicine review. Work with your health care provider to regularly review all of the medicines you are taking, including over-the-counter medicines. Ask your health care provider about any side effects that may make you more likely to have a fall. Tell your health care provider if any medicines that you take make you feel dizzy or sleepy. Strength and balance checks. Your health care provider may recommend certain tests to check your strength and balance while standing, walking, or changing positions. Foot health exam. Foot pain and numbness, as well as not wearing proper footwear, can make you more likely to have a fall. Screenings, including: Osteoporosis screening. Osteoporosis is a condition that causes  the bones to get weaker and break more easily. Blood pressure screening. Blood pressure changes and medicines to control blood pressure can make you feel dizzy. Depression screening. You may be more likely to have a fall if you have a fear of falling, feel depressed, or feel unable to do activities that you used to do. Alcohol  use screening. Using too much alcohol  can affect your balance and may make you more likely to have a fall. Follow these instructions at home: Lifestyle Do not drink alcohol  if: Your health care provider tells you not to drink. If you drink alcohol : Limit how much you have to: 0-1 drink a day for women. 0-2 drinks a day for men. Know how much alcohol  is in your drink. In the [place] one drink equals one 12 oz bottle of beer (355 mL), one 5 oz glass of wine (148 mL), or one 1 oz glass of hard liquor (44 mL). Do not use any products that contain nicotine or tobacco. These products include cigarettes, chewing tobacco, and vaping devices, such as e-cigarettes. If you need help quitting, ask your health care provider. Activity  Follow a regular exercise program to stay fit. This will help you maintain your balance. Ask your health care provider what types of exercise are appropriate for you. If you need a cane or walker, use it as recommended by your health care provider. Wear supportive shoes that have nonskid soles. Safety  Remove any tripping hazards, such as rugs, cords, and clutter. Install safety equipment such as grab bars in bathrooms and safety rails on stairs. Keep rooms and walkways  well-lit. General instructions Talk with your health care provider about your risks for falling. Tell your health care provider if: You fall. Be sure to tell your health care provider about all falls, even ones that seem minor. You feel dizzy, tiredness (fatigue), or off-balance. Take over-the-counter and prescription medicines only as told by your health care provider. These include  supplements. Eat a healthy diet and maintain a healthy weight. A healthy diet includes low-fat dairy products, low-fat (lean) meats, and fiber from whole grains, beans, and lots of fruits and vegetables. Stay current with your vaccines. Schedule regular health, dental, and eye exams. Summary Having a healthy lifestyle and getting preventive care can help to protect your health and wellness after age 15. Screening and testing are the best way to find a health problem early and help you avoid having a fall. Early diagnosis and treatment give you the best chance for managing medical conditions that are more common for people who are older than age 42. Falls are a major cause of broken bones and head injuries in people who are older than age 64. Take precautions to prevent a fall at home. Work with your health care provider to learn what changes you can make to improve your health and wellness and to prevent falls. This information is not intended to replace advice given to you by your health care provider. Make sure you discuss any questions you have with your health care provider. Document Revised: 12/07/2020 Document Reviewed: 12/07/2020 Elsevier Patient Education  2024 [employer].

## 2023-08-08 NOTE — Assessment & Plan Note (Signed)
Both ears irrigated and  obstruction relieved Earwax irrigated out bilaterally Hearing improved after procedure. No complications.  Tolerated procedure well.

## 2023-08-30 ENCOUNTER — Ambulatory Visit: Payer: Medicare Other | Admitting: Emergency Medicine

## 2023-10-11 ENCOUNTER — Encounter: Payer: Self-pay | Admitting: Podiatry

## 2023-10-11 ENCOUNTER — Ambulatory Visit (INDEPENDENT_AMBULATORY_CARE_PROVIDER_SITE_OTHER): Payer: Medicare Other | Admitting: Podiatry

## 2023-10-11 VITALS — Ht 68.0 in | Wt 192.0 lb

## 2023-10-11 DIAGNOSIS — M79674 Pain in right toe(s): Secondary | ICD-10-CM | POA: Diagnosis not present

## 2023-10-11 DIAGNOSIS — B351 Tinea unguium: Secondary | ICD-10-CM | POA: Diagnosis not present

## 2023-10-11 DIAGNOSIS — M79675 Pain in left toe(s): Secondary | ICD-10-CM | POA: Diagnosis not present

## 2023-10-11 DIAGNOSIS — N182 Chronic kidney disease, stage 2 (mild): Secondary | ICD-10-CM

## 2023-10-11 DIAGNOSIS — E1122 Type 2 diabetes mellitus with diabetic chronic kidney disease: Secondary | ICD-10-CM | POA: Diagnosis not present

## 2023-10-15 NOTE — Progress Notes (Signed)
  Subjective:  Patient ID: Justin [name], male    DOB: 1941/07/03,  MRN: [id number]  83 y.o. male presents to clinic with  at risk foot care. Pt has h/o NIDDM with chronic kidney disease and painful, elongated thickened toenails x 10 which are symptomatic when wearing enclosed shoe gear. This interferes with his/her daily activities.  Chief Complaint  Patient presents with   Nail Problem    Pt is here for [facility] unsure of last A1C PCP is Dr Alvy Bimler and LOV was in January.   New problem(s): None   PCP is Sagardia, Eilleen Kempf, MD.  No Known Allergies  Review of Systems: Negative except as noted in the HPI.   Objective:  Justin [name] is a pleasant 83 y.o. male in NAD. AAO x 3.  Vascular Examination: Vascular status intact b/l with palpable pedal pulses. CFT immediate b/l. No edema. No pain with calf compression b/l. Skin temperature gradient WNL b/l. Pedal hair absent. No cyanosis or clubbing noted b/l LE.  Neurological Examination: Sensation grossly intact b/l with 10 gram monofilament. Vibratory sensation intact b/l.   Dermatological Examination: Pedal skin with normal turgor, texture and tone b/l. Toenails 1-5 b/l thick, discolored, elongated with subungual debris and pain on dorsal palpation. No hyperkeratotic lesions noted b/l.   Musculoskeletal Examination: Muscle strength 5/5 to b/l LE. HAV with bunion deformity noted b/l LE.  Radiographs: None  Last A1c:      Latest Ref Rng & Units 02/23/2023    8:13 AM  Hemoglobin A1C  Hemoglobin-A1c 4.6 - 6.5 % 7.3    Assessment:   1. 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 Type 2 diabetes mellitus with stage 2 chronic kidney disease, without long-term current use of insulin (HCC)    Plan:  Consent given for treatment. Patient examined. All patient's and/or POA's questions/concerns addressed on today's visit. Mycotic toenails 1-5 debrided in length and girth without incident. Continue foot and shoe inspections  daily. Monitor blood glucose per PCP/Endocrinologist's recommendations.Continue soft, supportive shoe gear daily. Report any pedal injuries to medical professional. Call office if there are any quesitons/concerns. -Patient/POA to call should there be question/concern in the interim.  Return in about 3 months (around 01/11/2024).  Freddie Breech, DPM      [place] LOCATION: [address] [place] [address]                   Office [phone]   [facility] LOCATION: [address] [place], [place] [address] Office [phone]

## 2023-10-23 ENCOUNTER — Ambulatory Visit: Payer: Self-pay | Admitting: Emergency Medicine

## 2023-10-23 NOTE — Telephone Encounter (Signed)
 Copied from CRM [id number]. Topic: Appointments - Appointment Scheduling >> Oct 23, 2023 10:36 AM Truddie Crumble wrote: Reason for CRM: patient blood sugar has been high and today it has been 306   Chief Complaint: Elevated BG Symptoms: blurred vision and urinary frequency Frequency: intermitent Pertinent Negatives: Patient denies shortness of breath Disposition: [] ED /[] Urgent Care (no appt availability in office) / [x] Appointment(In office/virtual)/ []  [facility]/ [] Home Care/ [] Refused Recommended Disposition /[] [place] [facility]/ []  Follow-up with PCP Additional Notes: Per sister/caregiver pt has elevated BG levels past few days. 306 this morning. Call made to [facility] spoke with Maralyn Sago, April appt rescheduled for tomorrow at 0920.   Reason for Disposition  [1] Blood glucose > 300 mg/dL (04.5 mmol/L) AND [4] two or more times in a row  Answer Assessment - Initial Assessment Questions 1. BLOOD GLUCOSE: "What is your blood glucose level?"      306  2. ONSET: "When did you check the blood glucose?"    This morning  3. USUAL RANGE: "What is your glucose level usually?" (e.g., usual fasting morning value, usual evening value)     Unsure of normal range (pt is developmentally delayed)  4. KETONES: "Do you check for ketones (urine or blood test strips)?" If Yes, ask: "What does the test show now?"      No  5. TYPE 1 or 2:  "Do you know what type of diabetes you have?"  (e.g., Type 1, Type 2, Gestational; doesn't know)      Type 2 DM  6. INSULIN: "Do you take insulin?" "What type of insulin(s) do you use? What is the mode of delivery? (syringe, pen; injection or pump)?"      No  7. DIABETES PILLS: "Do you take any pills for your diabetes?" If Yes, ask: "Have you missed taking any pills recently?"     Januvia and Jardiance  8. OTHER SYMPTOMS: "Do you have any symptoms?" (e.g., fever, frequent urination, difficulty breathing, dizziness, weakness, vomiting)     Left eye is  "different", urinary frequency  Protocols used: Diabetes - High Blood Sugar-A-AH

## 2023-10-24 ENCOUNTER — Encounter: Payer: Self-pay | Admitting: Emergency Medicine

## 2023-10-24 ENCOUNTER — Telehealth: Payer: Self-pay | Admitting: Emergency Medicine

## 2023-10-24 ENCOUNTER — Ambulatory Visit (INDEPENDENT_AMBULATORY_CARE_PROVIDER_SITE_OTHER): Admitting: Emergency Medicine

## 2023-10-24 VITALS — BP 124/74 | HR 72 | Temp 94.1°F | Ht 68.0 in | Wt 183.0 lb

## 2023-10-24 DIAGNOSIS — Z7984 Long term (current) use of oral hypoglycemic drugs: Secondary | ICD-10-CM | POA: Diagnosis not present

## 2023-10-24 DIAGNOSIS — E1165 Type 2 diabetes mellitus with hyperglycemia: Secondary | ICD-10-CM | POA: Insufficient documentation

## 2023-10-24 DIAGNOSIS — E785 Hyperlipidemia, unspecified: Secondary | ICD-10-CM

## 2023-10-24 DIAGNOSIS — E1159 Type 2 diabetes mellitus with other circulatory complications: Secondary | ICD-10-CM | POA: Diagnosis not present

## 2023-10-24 DIAGNOSIS — N1832 Chronic kidney disease, stage 3b: Secondary | ICD-10-CM

## 2023-10-24 DIAGNOSIS — E1169 Type 2 diabetes mellitus with other specified complication: Secondary | ICD-10-CM

## 2023-10-24 DIAGNOSIS — I152 Hypertension secondary to endocrine disorders: Secondary | ICD-10-CM | POA: Diagnosis not present

## 2023-10-24 LAB — COMPREHENSIVE METABOLIC PANEL
ALT: 16 U/L (ref 0–53)
AST: 17 U/L (ref 0–37)
Albumin: 4.1 g/dL (ref 3.5–5.2)
Alkaline Phosphatase: 95 U/L (ref 39–117)
BUN: 32 mg/dL — ABNORMAL HIGH (ref 6–23)
CO2: 29 meq/L (ref 19–32)
Calcium: 9.2 mg/dL (ref 8.4–10.5)
Chloride: 98 meq/L (ref 96–112)
Creatinine, Ser: 1.56 mg/dL — ABNORMAL HIGH (ref 0.40–1.50)
GFR: 40.87 mL/min — ABNORMAL LOW (ref >60.00)
Glucose, Bld: 399 mg/dL — ABNORMAL HIGH (ref 70–99)
Potassium: 4.7 meq/L (ref 3.5–5.1)
Sodium: 135 meq/L (ref 135–145)
Total Bilirubin: 0.9 mg/dL (ref 0.2–1.2)
Total Protein: 7.3 g/dL (ref 6.0–8.3)

## 2023-10-24 LAB — TSH: TSH: 3.5 u[IU]/mL (ref 0.35–5.50)

## 2023-10-24 LAB — CBC WITH DIFFERENTIAL/PLATELET
Basophils Absolute: 0.1 10*3/uL (ref 0.0–0.1)
Basophils Relative: 0.7 % (ref 0.0–3.0)
Eosinophils Absolute: 0.1 10*3/uL (ref 0.0–0.7)
Eosinophils Relative: 1.1 % (ref 0.0–5.0)
HCT: 45.6 % (ref 39.0–52.0)
Hemoglobin: 15.2 g/dL (ref 13.0–17.0)
Lymphocytes Relative: 16 % (ref 12.0–46.0)
Lymphs Abs: 1.2 10*3/uL (ref 0.7–4.0)
MCHC: 33.4 g/dL (ref 30.0–36.0)
MCV: 86.3 fl (ref 78.0–100.0)
Monocytes Absolute: 0.5 10*3/uL (ref 0.1–1.0)
Monocytes Relative: 6.7 % (ref 3.0–12.0)
Neutro Abs: 5.7 10*3/uL (ref 1.4–7.7)
Neutrophils Relative %: 75.5 % (ref 43.0–77.0)
Platelets: 161 10*3/uL (ref 150.0–400.0)
RBC: 5.29 Mil/uL (ref 4.22–5.81)
RDW: 14.7 % (ref 11.5–15.5)
WBC: 7.6 10*3/uL (ref 4.0–10.5)

## 2023-10-24 LAB — POCT GLYCOSYLATED HEMOGLOBIN (HGB A1C): Hemoglobin A1C: 11 % — AB (ref 4.0–5.6)

## 2023-10-24 LAB — MICROALBUMIN / CREATININE URINE RATIO
Creatinine,U: 35.5 mg/dL
Microalb Creat Ratio: 104.1 mg/g — ABNORMAL HIGH (ref 0.0–30.0)
Microalb, Ur: 3.7 mg/dL — ABNORMAL HIGH (ref 0.0–1.9)

## 2023-10-24 LAB — LIPID PANEL
Cholesterol: 135 mg/dL (ref 0–200)
HDL: 45.6 mg/dL (ref >39.00)
LDL Cholesterol: 47 mg/dL (ref 0–99)
NonHDL: 89.61
Total CHOL/HDL Ratio: 3
Triglycerides: 212 mg/dL — ABNORMAL HIGH (ref 0.0–149.0)
VLDL: 42.4 mg/dL — ABNORMAL HIGH (ref 0.0–40.0)

## 2023-10-24 MED ORDER — GLIPIZIDE 5 MG PO TABS: 5.0000 mg | ORAL_TABLET | Freq: Every day | ORAL | 3 refills | Status: AC

## 2023-10-24 MED ORDER — EMPAGLIFLOZIN 25 MG PO TABS: 25.0000 mg | ORAL_TABLET | Freq: Every day | ORAL | 3 refills | Status: AC

## 2023-10-24 NOTE — Patient Instructions (Signed)

## 2023-10-24 NOTE — Telephone Encounter (Unsigned)
 Copied from CRM [id number]. Topic: Clinical - Prescription Issue >> Oct 24, 2023  3:44 PM Pascal Lux wrote: Reason for CRM: Patient brother Decklin stated today at his appointment they were told patient medication glipiZIDE (GLUCOTROL) 5 MG tablet [[id number]] would be changed to a 10MG . Requesting a call back for clarity. [phone]

## 2023-10-24 NOTE — Progress Notes (Signed)
 Justin [name] 83 y.o.   Chief Complaint  Patient presents with   Diabetes    Patient states his sugars has been in the 200s and 300s and the highest being 375. Patient states his left 3 middle fingers are tingling for about a year     HISTORY OF PRESENT ILLNESS: This is a 83 y.o. male here for follow-up of multiple chronic medical conditions including diabetes and hypertension Has been complaining of tingling to several fingers for many months Blood glucose at home in the 300 range. Presently taking Januvia 100 mg daily and Jardiance 10 mg daily  Diabetes Pertinent negatives for hypoglycemia include no dizziness or headaches. Pertinent negatives for diabetes include no chest pain.     Prior to Admission medications   Medication Sig Start Date End Date Taking? Authorizing Provider  docusate sodium (COLACE) 100 MG capsule Take 100 mg by mouth daily.   Yes [provider]  empagliflozin (JARDIANCE) 10 MG TABS tablet Take 1 tablet (10 mg total) by mouth daily. 02/27/23  Yes Ellsworth Waldschmidt, Eilleen Kempf, MD  finasteride (PROSCAR) 5 MG tablet Take 1 tablet (5 mg total) by mouth daily. 12/16/22  Yes Martesha Niedermeier, Eilleen Kempf, MD  glucose blood (ONETOUCH ULTRA) test strip USE TO CHECK BLOOD SUGARS ONCE DAILY AS DIRECTED FOR DIABETES 11/16/22  Yes Kendyn Zaman, Eilleen Kempf, MD  Lancets [facility] DELICA PLUS LANCET33G) MISC USE UP TO 4 TIMES A DAY AS DIRECTED 12/01/21  Yes Reed, Tiffany L, DO  rosuvastatin (CRESTOR) 20 MG tablet Take 1 tablet (20 mg total) by mouth daily. 12/16/22  Yes Kinslee Dalpe, Eilleen Kempf, MD  sitaGLIPtin (JANUVIA) 100 MG tablet Take 1 tablet (100 mg total) by mouth daily. 02/27/23  Yes Seleen Walter, Eilleen Kempf, MD  tamsulosin (FLOMAX) 0.4 MG CAPS capsule Take 1 capsule (0.4 mg total) by mouth daily. 02/27/23  Yes Georgina Quint, MD  triamcinolone cream (KENALOG) 0.1 % SMARTSIG:1 Application Topical 2-3 Times Daily 02/23/23  Yes [provider]    No Known  Allergies  Patient Active Problem List   Diagnosis Date Noted   Stage 3b chronic kidney disease (HCC) 08/30/2022   Hypertension associated with diabetes (HCC) 08/30/2022   Splenic artery aneurysm (HCC) 06/10/2019   Hearing loss of both ears due to cerumen impaction 06/10/2019   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   Seasonal allergies 03/25/2019   Tubular adenoma of colon 03/25/2014   Dyslipidemia associated with type 2 diabetes mellitus (HCC) 03/25/2014   Anemia, iron deficiency 03/25/2014   Essential hypertension, benign 03/25/2014   Hyperlipidemia 03/25/2014   Personal history of colon cancer, stage II 03/25/2014   Prostate nodule without urinary obstruction 03/25/2014    Past Medical History:  Diagnosis Date   Arthritis    Chronic kidney disease    CKD- Stage  II   Colon cancer (HCC)    2014   Colon polyps    adenomatous   Diabetes mellitus without complication (HCC)    Eczema    Hypercholesteremia    Hypertension     Past Surgical History:  Procedure Laterality Date   COLON SURGERY  2014 & 2015   resection of colon & removal of poylp ; Dr June Leap, Dr De Burrs   COLONOSCOPY  09/29/2013   COLONOSCOPY WITH PROPOFOL N/A 03/10/2015   Procedure: COLONOSCOPY WITH PROPOFOL;  Surgeon: Louis Meckel, MD;  Location: [facility];  Service: Endoscopy;  Laterality: N/A;   EXTERNAL EAR SURGERY  1954   FLEXIBLE SIGMOIDOSCOPY N/A 07/21/2014  Procedure: FLEXIBLE SIGMOIDOSCOPY;  Surgeon: Louis Meckel, MD;  Location: [facility];  Service: Endoscopy;  Laterality: N/A;    Social History   Socioeconomic History   Marital status: Single    Spouse name: Not on file   Number of children: Not on file   Years of education: Not on file   Highest education level: Not on file  Occupational History   Not on file  Tobacco Use   Smoking status: Never   Smokeless tobacco: Never  Vaping Use   Vaping status: Never Used  Substance and Sexual Activity   Alcohol  use: Yes    Alcohol/week: 7.0 standard drinks of alcohol    Types: 7 Cans of beer per week    Comment: weekly   Drug use: No   Sexual activity: Not on file  Other Topics Concern   Not on file  Social History Narrative   Single, lives in 2 stories home with 3 people and 1 dog.  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, has Living Will & POA (sister)   Right handed   Social Drivers of Health   Financial Resource Strain: Low Risk  (12/16/2022)   Overall Financial Resource Strain (CARDIA)    Difficulty of Paying Living Expenses: Not hard at all  Food Insecurity: No Food Insecurity (12/16/2022)   Hunger Vital Sign    Worried About Running Out of Food in the Last Year: Never true    Ran Out of Food in the Last Year: Never true  Transportation Needs: No Transportation Needs (12/16/2022)   PRAPARE - Administrator, Civil Service (Medical): No    Lack of Transportation (Non-Medical): No  Physical Activity: Inactive (12/16/2022)   Exercise Vital Sign    Days of Exercise per Week: 0 days    Minutes of Exercise per Session: 0 min  Stress: No Stress Concern Present (12/16/2022)   [employer] of Occupational Health - Occupational Stress Questionnaire    Feeling of Stress : Not at all  Social Connections: Moderately Isolated (12/16/2022)   Social Connection and Isolation Panel [NHANES]    Frequency of Communication with Friends and Family: More than three times a week    Frequency of Social Gatherings with Friends and Family: More than three times a week    Attends Religious Services: More than 4 times per year    Active Member of [employer] or Organizations: No    Attends Banker Meetings: Never    Marital Status: Never married  Intimate Partner Violence: Not At Risk (12/16/2022)   Humiliation, Afraid, Rape, and Kick questionnaire    Fear of Current or Ex-Partner: No    Emotionally Abused: No    Physically Abused: No    Sexually Abused: No     Family History  Problem Relation Age of Onset   Varicose Veins Mother    Stroke Mother    Glaucoma Mother    Kidney disease Mother    Hypertension Father    Heart attack Father    Cancer Brother        testicular cancer   Hyperlipidemia Sister    Meniere's disease Sister      Review of Systems  Constitutional: Negative.  Negative for chills and fever.  HENT: Negative.  Negative for congestion and sore throat.   Respiratory: Negative.  Negative for cough and shortness of breath.   Cardiovascular: Negative.  Negative for chest pain and palpitations.  Gastrointestinal:  Negative  for abdominal pain, nausea and vomiting.  Genitourinary: Negative.  Negative for dysuria and hematuria.  Skin: Negative.   Neurological: Negative.  Negative for dizziness and headaches.  All other systems reviewed and are negative.   Today's Vitals   10/24/23 0917  BP: 124/74  Pulse: 72  Temp: (!) 94.1 F (34.5 C)  TempSrc: Oral  SpO2: 95%  Weight: 183 lb (83 kg)  Height: 5\' 8"  (1.727 m)   Body mass index is 27.83 kg/m.   Physical Exam Vitals reviewed.  Constitutional:      Appearance: Normal appearance.  HENT:     Head: Normocephalic.     Mouth/Throat:     Mouth: Mucous membranes are moist.     Pharynx: Oropharynx is clear.  Eyes:     Extraocular Movements: Extraocular movements intact.     Pupils: Pupils are equal, round, and reactive to light.  Cardiovascular:     Rate and Rhythm: Normal rate and regular rhythm.     Pulses: Normal pulses.     Heart sounds: Normal heart sounds.  Pulmonary:     Effort: Pulmonary effort is normal.     Breath sounds: Normal breath sounds.  Abdominal:     Palpations: Abdomen is soft.     Tenderness: There is no abdominal tenderness.  Musculoskeletal:     Cervical back: No tenderness.  Lymphadenopathy:     Cervical: No cervical adenopathy.  Skin:    General: Skin is warm and dry.     Capillary Refill: Capillary refill takes less than 2  seconds.  Neurological:     General: No focal deficit present.     Mental Status: He is alert and oriented to person, place, and time.  Psychiatric:        Mood and Affect: Mood normal.        Behavior: Behavior normal.    Results for orders placed or performed in visit on 10/24/23 (from the past 24 hours)  POCT glycosylated hemoglobin (Hb A1C)     Status: Abnormal   Collection Time: 10/24/23 11:54 AM  Result Value Ref Range   Hemoglobin A1C 11.0 (A) 4.0 - 5.6 %   HbA1c POC (<> result, manual entry)     HbA1c, POC (prediabetic range)     HbA1c, POC (controlled diabetic range)        ASSESSMENT & PLAN: A total of 46 minutes was spent with the patient and counseling/coordination of care regarding preparing for this visit, review of most recent office visit notes, review of multiple chronic medical conditions and their management, cardio vascular risks associated with uncontrolled diabetes, review of all medications and changes made, review of most recent bloodwork results including interpretation of today's hemoglobin A1c, review of health maintenance items, education on nutrition, prognosis, documentation, and need for follow up.   Problem List Items Addressed This Visit       Cardiovascular and Mediastinum   Hypertension associated with diabetes (HCC) - Primary   BP Readings from Last 3 Encounters:  08/08/23 114/68  02/27/23 124/76  12/16/22 122/68  Well-controlled hypertension off medication Uncontrolled diabetes with hemoglobin A1c at 11.0 Recommend to continue Januvia 100 mg, increase Jardiance to 25 mg daily, and start glipizide 5 mg in the morning Cardiovascular risks associated with uncontrolled diabetes discussed Diet and nutrition discussed Blood work and urine done today Follow-up in 3 months       Relevant Medications   empagliflozin (JARDIANCE) 25 MG TABS tablet   glipiZIDE (GLUCOTROL) 5 MG  tablet   Other Relevant Orders   POCT glycosylated hemoglobin (Hb  A1C)   CBC with Differential/Platelet   Comprehensive metabolic panel   Lipid panel   TSH   Microalbumin / creatinine urine ratio     Endocrine   Dyslipidemia associated with type 2 diabetes mellitus (HCC)   Uncontrolled diabetes with hemoglobin A1c at 11.0 Medication changes as above Continue rosuvastatin 20 mg daily Diet and nutrition discussed Cardiovascular risks associated with dyslipidemia discussed Blood work done today Follow-up in 3 months      Relevant Medications   empagliflozin (JARDIANCE) 25 MG TABS tablet   glipiZIDE (GLUCOTROL) 5 MG tablet   Other Relevant Orders   POCT glycosylated hemoglobin (Hb A1C)   CBC with Differential/Platelet   Comprehensive metabolic panel   Lipid panel   TSH   Microalbumin / creatinine urine ratio   Uncontrolled type 2 diabetes mellitus with hyperglycemia (HCC)   Relevant Medications   empagliflozin (JARDIANCE) 25 MG TABS tablet   glipiZIDE (GLUCOTROL) 5 MG tablet   Other Relevant Orders   CBC with Differential/Platelet   Comprehensive metabolic panel   Lipid panel   TSH     Genitourinary   Stage 3b chronic kidney disease (HCC)   Advised to stay well-hydrated and avoid NSAIDs Increase Jardiance to 25 mg daily      Relevant Medications   empagliflozin (JARDIANCE) 25 MG TABS tablet   Other Relevant Orders   Comprehensive metabolic panel   Patient Instructions  Diabetes Mellitus and Nutrition, Adult When you have diabetes, or diabetes mellitus, it is very important to have healthy eating habits because your blood sugar (glucose) levels are greatly affected by what you eat and drink. Eating healthy foods in the right amounts, at about the same times every day, can help you: Manage your blood glucose. Lower your risk of heart disease. Improve your blood pressure. Reach or maintain a healthy weight. What can affect my meal plan? Every person with diabetes is different, and each person has different needs for a meal plan.  Your health care provider may recommend that you work with a dietitian to make a meal plan that is best for you. Your meal plan may vary depending on factors such as: The calories you need. The medicines you take. Your weight. Your blood glucose, blood pressure, and cholesterol levels. Your activity level. Other health conditions you have, such as heart or kidney disease. How do carbohydrates affect me? Carbohydrates, also called carbs, affect your blood glucose level more than any other type of food. Eating carbs raises the amount of glucose in your blood. It is important to know how many carbs you can safely have in each meal. This is different for every person. Your dietitian can help you calculate how many carbs you should have at each meal and for each snack. How does alcohol affect me? Alcohol can cause a decrease in blood glucose (hypoglycemia), especially if you use insulin or take certain diabetes medicines by mouth. Hypoglycemia can be a life-threatening condition. Symptoms of hypoglycemia, such as sleepiness, dizziness, and confusion, are similar to symptoms of having too much alcohol. Do not drink alcohol if: Your health care provider tells you not to drink. You are pregnant, may be pregnant, or are planning to become pregnant. If you drink alcohol: Limit how much you have to: 0-1 drink a day for women. 0-2 drinks a day for men. Know how much alcohol is in your drink. In the [place] one drink equals one  12 oz bottle of beer (355 mL), one 5 oz glass of wine (148 mL), or one 1 oz glass of hard liquor (44 mL). Keep yourself hydrated with water, diet soda, or unsweetened iced tea. Keep in mind that regular soda, juice, and other mixers may contain a lot of sugar and must be counted as carbs. What are tips for following this plan?  Reading food labels Start by checking the serving size on the Nutrition Facts label of packaged foods and drinks. The number of calories and the amount of  carbs, fats, and other nutrients listed on the label are based on one serving of the item. Many items contain more than one serving per package. Check the total grams (g) of carbs in one serving. Check the number of grams of saturated fats and trans fats in one serving. Choose foods that have a low amount or none of these fats. Check the number of milligrams (mg) of salt (sodium) in one serving. Most people should limit total sodium intake to less than 2,300 mg per day. Always check the nutrition information of foods labeled as "low-fat" or "nonfat." These foods may be higher in added sugar or refined carbs and should be avoided. Talk to your dietitian to identify your daily goals for nutrients listed on the label. Shopping Avoid buying canned, pre-made, or processed foods. These foods tend to be high in fat, sodium, and added sugar. Shop around the outside edge of the grocery store. This is where you will most often find fresh fruits and vegetables, bulk grains, fresh meats, and fresh dairy products. Cooking Use low-heat cooking methods, such as baking, instead of high-heat cooking methods, such as deep frying. Cook using healthy oils, such as olive, canola, or sunflower oil. Avoid cooking with butter, cream, or high-fat meats. Meal planning Eat meals and snacks regularly, preferably at the same times every day. Avoid going long periods of time without eating. Eat foods that are high in fiber, such as fresh fruits, vegetables, beans, and whole grains. Eat 4-6 oz (112-168 g) of lean protein each day, such as lean meat, chicken, fish, eggs, or tofu. One ounce (oz) (28 g) of lean protein is equal to: 1 oz (28 g) of meat, chicken, or fish. 1 egg.  cup (62 g) of tofu. Eat some foods each day that contain healthy fats, such as avocado, nuts, seeds, and fish. What foods should I eat? Fruits Berries. Apples. Oranges. Peaches. Apricots. Plums. Grapes. Mangoes. Papayas. Pomegranates. Kiwi.  Cherries. Vegetables Leafy greens, including lettuce, spinach, kale, chard, collard greens, mustard greens, and cabbage. Beets. Cauliflower. Broccoli. Carrots. Green beans. Tomatoes. Peppers. Onions. Cucumbers. Brussels sprouts. Grains Whole grains, such as whole-wheat or whole-grain bread, crackers, tortillas, cereal, and pasta. Unsweetened oatmeal. Quinoa. Brown or wild rice. Meats and other proteins Seafood. Poultry without skin. Lean cuts of poultry and beef. Tofu. Nuts. Seeds. Dairy Low-fat or fat-free dairy products such as milk, yogurt, and cheese. The items listed above may not be a complete list of foods and beverages you can eat and drink. Contact a dietitian for more information. What foods should I avoid? Fruits Fruits canned with syrup. Vegetables Canned vegetables. Frozen vegetables with butter or cream sauce. Grains Refined white flour and flour products such as bread, pasta, snack foods, and cereals. Avoid all processed foods. Meats and other proteins Fatty cuts of meat. Poultry with skin. Breaded or fried meats. Processed meat. Avoid saturated fats. Dairy Full-fat yogurt, cheese, or milk. Beverages Sweetened drinks, such as  soda or iced tea. The items listed above may not be a complete list of foods and beverages you should avoid. Contact a dietitian for more information. Questions to ask a health care provider Do I need to meet with a certified diabetes care and education specialist? Do I need to meet with a dietitian? What number can I call if I have questions? When are the best times to check my blood glucose? Where to find more information: American Diabetes Association: diabetes.org Academy of Nutrition and Dietetics: eatright.[employer] of Diabetes and Digestive and Kidney Diseases: StageSync.si Association of Diabetes Care & Education Specialists: diabeteseducator.org Summary It is important to have healthy eating habits because your blood sugar  (glucose) levels are greatly affected by what you eat and drink. It is important to use alcohol carefully. A healthy meal plan will help you manage your blood glucose and lower your risk of heart disease. Your health care provider may recommend that you work with a dietitian to make a meal plan that is best for you. This information is not intended to replace advice given to you by your health care provider. Make sure you discuss any questions you have with your health care provider. Document Revised: 02/18/2020 Document Reviewed: 02/19/2020 Elsevier Patient Education  2024 Elsevier Inc.     Edwina Barth, MD [facility]

## 2023-10-24 NOTE — Assessment & Plan Note (Signed)
 Advised to stay well-hydrated and avoid NSAIDs Increase Jardiance to 25 mg daily

## 2023-10-24 NOTE — Assessment & Plan Note (Signed)
 Uncontrolled diabetes with hemoglobin A1c at 11.0 Medication changes as above Continue rosuvastatin 20 mg daily Diet and nutrition discussed Cardiovascular risks associated with dyslipidemia discussed Blood work done today Follow-up in 3 months

## 2023-10-24 NOTE — Assessment & Plan Note (Addendum)
 BP Readings from Last 3 Encounters:  08/08/23 114/68  02/27/23 124/76  12/16/22 122/68  Well-controlled hypertension off medication Uncontrolled diabetes with hemoglobin A1c at 11.0 Recommend to continue Januvia 100 mg, increase Jardiance to 25 mg daily, and start glipizide 5 mg in the morning Cardiovascular risks associated with uncontrolled diabetes discussed Diet and nutrition discussed Blood work and urine done today Follow-up in 3 months

## 2023-10-25 ENCOUNTER — Other Ambulatory Visit: Payer: Self-pay | Admitting: Radiology

## 2023-10-25 DIAGNOSIS — E1159 Type 2 diabetes mellitus with other circulatory complications: Secondary | ICD-10-CM

## 2023-10-25 DIAGNOSIS — E1165 Type 2 diabetes mellitus with hyperglycemia: Secondary | ICD-10-CM

## 2023-10-25 DIAGNOSIS — E1169 Type 2 diabetes mellitus with other specified complication: Secondary | ICD-10-CM

## 2023-10-25 NOTE — Telephone Encounter (Signed)
 Spoke with patient him aware he is to take 5mg  twice daily. He understood and requested to fix it on the label

## 2023-10-25 NOTE — Telephone Encounter (Signed)
 Glipizide was not on his medication list.  I just started it as a new medication.  If he was taking it, he should then take 5 mg twice a day.

## 2023-11-09 ENCOUNTER — Ambulatory Visit: Payer: Medicare Other | Admitting: Emergency Medicine

## 2023-12-04 ENCOUNTER — Other Ambulatory Visit: Payer: Self-pay | Admitting: Emergency Medicine

## 2023-12-12 DIAGNOSIS — H0102A Squamous blepharitis right eye, upper and lower eyelids: Secondary | ICD-10-CM | POA: Diagnosis not present

## 2023-12-12 DIAGNOSIS — H0102B Squamous blepharitis left eye, upper and lower eyelids: Secondary | ICD-10-CM | POA: Diagnosis not present

## 2023-12-12 DIAGNOSIS — H01131 Eczematous dermatitis of right upper eyelid: Secondary | ICD-10-CM | POA: Diagnosis not present

## 2023-12-12 DIAGNOSIS — H01134 Eczematous dermatitis of left upper eyelid: Secondary | ICD-10-CM | POA: Diagnosis not present

## 2023-12-12 DIAGNOSIS — H16143 Punctate keratitis, bilateral: Secondary | ICD-10-CM | POA: Diagnosis not present

## 2023-12-28 DIAGNOSIS — L708 Other acne: Secondary | ICD-10-CM | POA: Diagnosis not present

## 2023-12-28 DIAGNOSIS — L821 Other seborrheic keratosis: Secondary | ICD-10-CM | POA: Diagnosis not present

## 2024-01-02 ENCOUNTER — Ambulatory Visit (INDEPENDENT_AMBULATORY_CARE_PROVIDER_SITE_OTHER)

## 2024-01-02 VITALS — BP 130/70 | HR 64 | Ht 64.0 in | Wt 184.2 lb

## 2024-01-02 DIAGNOSIS — H9193 Unspecified hearing loss, bilateral: Secondary | ICD-10-CM

## 2024-01-02 DIAGNOSIS — Z0000 Encounter for general adult medical examination without abnormal findings: Secondary | ICD-10-CM | POA: Diagnosis not present

## 2024-01-02 NOTE — Patient Instructions (Signed)
 Mr. Justin [name] , Thank you for taking time out of your busy schedule to complete your Annual Wellness Visit with me. I enjoyed our conversation and look forward to speaking with you again next year. I, as well as your care team,  appreciate your ongoing commitment to your health goals. Please review the following plan we discussed and let me know if I can assist you in the future. Your Game plan/ To Do List   Follow up Visits: Next Medicare AWV with our clinical staff: 01/06/2025   Have you seen your provider in the last 6 months (3 months if uncontrolled diabetes)? Yes Next Office Visit with your provider: 01/24/2024  Clinician Recommendations:  Aim for 30 minutes of exercise or brisk walking, 6-8 glasses of water, and 5 servings of fruits and vegetables each day. Educated and advised on getting the Shingles, COVID, and Tdap (Tetenus) vaccines at the local pharmacy in 2025.      This is a list of the screening recommended for you and due dates:  Health Maintenance  Topic Date Due   Zoster (Shingles) Vaccine (1 of 2) 06/24/1990   DTaP/Tdap/Td vaccine (2 - Td or Tdap) 02/08/2023   COVID-19 Vaccine (5 - 2024-25 season) 10/31/2023   Flu Shot  03/01/2024   Hemoglobin A1C  04/25/2024   Complete foot exam   06/12/2024   Yearly kidney function blood test for diabetes  10/23/2024   Yearly kidney health urinalysis for diabetes  10/23/2024   Eye exam for diabetics  12/17/2024   Medicare Annual Wellness Visit  01/01/2025   Pneumonia Vaccine  Completed   HPV Vaccine  Aged Out   Meningitis B Vaccine  Aged Out   Hepatitis C Screening  Discontinued    Advanced directives: (In Chart) A copy of your advanced directives are scanned into your chart should your provider ever need it. Advance Care Planning is important because it:  [x]  Makes sure you receive the medical care that is consistent with your values, goals, and preferences  [x]  It provides guidance to your family and loved ones and reduces their  decisional burden about whether or not they are making the right decisions based on your wishes.  Follow the link provided in your after visit summary or read over the paperwork we have mailed to you to help you started getting your Advance Directives in place. If you need assistance in completing these, please reach out to us  so that we can help you!

## 2024-01-02 NOTE — Progress Notes (Signed)
 Subjective:   Justin [name] is a 83 y.o. who presents for a Medicare Wellness preventive visit.  As a reminder, Annual Wellness Visits don't include a physical exam, and some assessments may be limited, especially if this visit is performed virtually. We may recommend an in-person follow-up visit with your provider if needed.  Visit Complete: In person  Persons Participating in Visit: Patient. Assisted by his Sister, Pascual Bonds (pt also resides with Sister)  AWV Questionnaire: No: Patient Medicare AWV questionnaire was not completed prior to this visit.  Cardiac Risk Factors include: advanced age (>34men, >51 women);diabetes mellitus;dyslipidemia;hypertension;male gender;obesity (BMI >30kg/m2)     Objective:     Today's Vitals   01/02/24 0927  BP: 130/70  Pulse: 64  Weight: 184 lb 3.2 oz (83.6 kg)  Height: 5\' 4"  (1.626 m)   Body mass index is 31.62 kg/m.     01/02/2024    9:16 AM 12/16/2022    9:06 AM 01/11/2021    1:23 PM 12/04/2020    3:39 PM 12/03/2020    8:27 AM 09/28/2020   11:02 AM 07/07/2020    9:42 AM  Advanced Directives  Does Patient Have a Medical Advance Directive? Yes Yes No No No Yes Yes  Type of Estate agent of [place];Living will Living will    Living will Healthcare Power of Attorney  Does patient want to make changes to medical advance directive? No - Patient declined     No - Patient declined No - Patient declined  Copy of Healthcare Power of Attorney in Chart? Yes - validated most recent copy scanned in chart (See row information)      Yes - validated most recent copy scanned in chart (See row information)  Would patient like information on creating a medical advance directive?    No - Patient declined No - Patient declined      Current Medications (verified) Outpatient Encounter Medications as of 01/02/2024  Medication Sig   docusate sodium  (COLACE) 100 MG capsule Take 100 mg by mouth daily.   empagliflozin  (JARDIANCE ) 25 MG  TABS tablet Take 1 tablet (25 mg total) by mouth daily before breakfast.   finasteride  (PROSCAR ) 5 MG tablet Take 1 tablet (5 mg total) by mouth daily.   glipiZIDE  (GLUCOTROL ) 5 MG tablet Take 1 tablet (5 mg total) by mouth daily before breakfast.   Lancets (ONETOUCH DELICA PLUS LANCET33G) MISC USE UP TO 4 TIMES A DAY AS DIRECTED   ONETOUCH ULTRA TEST test strip USE TO CHECK BLOODS SUGAR LEVELS DAILY AS DIRECTED FOR DIABETES   rosuvastatin  (CRESTOR ) 20 MG tablet Take 1 tablet (20 mg total) by mouth daily.   sitaGLIPtin  (JANUVIA ) 100 MG tablet Take 1 tablet (100 mg total) by mouth daily.   tamsulosin  (FLOMAX ) 0.4 MG CAPS capsule Take 1 capsule (0.4 mg total) by mouth daily.   triamcinolone cream (KENALOG) 0.1 % SMARTSIG:1 Application Topical 2-3 Times Daily   No facility-administered encounter medications on file as of 01/02/2024.    Allergies (verified) Patient has no known allergies.   History: Past Medical History:  Diagnosis Date   Arthritis    Chronic kidney disease    CKD- Stage  II   Colon cancer (HCC)    2014   Colon polyps    adenomatous   Diabetes mellitus without complication (HCC)    Eczema    Hypercholesteremia    Hypertension    Past Surgical History:  Procedure Laterality Date   COLON SURGERY  2014 & 2015   resection of colon & removal of poylp ; Dr Nita Bast, Dr Olita Best   COLONOSCOPY  09/29/2013   COLONOSCOPY WITH PROPOFOL  N/A 03/10/2015   Procedure: COLONOSCOPY WITH PROPOFOL ;  Surgeon: Claudette Cue, MD;  Location: [facility];  Service: Endoscopy;  Laterality: N/A;   EXTERNAL EAR SURGERY  1954   FLEXIBLE SIGMOIDOSCOPY N/A 07/21/2014   Procedure: FLEXIBLE SIGMOIDOSCOPY;  Surgeon: Claudette Cue, MD;  Location: [facility];  Service: Endoscopy;  Laterality: N/A;   Family History  Problem Relation Age of Onset   Varicose Veins Mother    Stroke Mother    Glaucoma Mother    Kidney disease Mother    Hypertension Father    Heart attack Father    Cancer  Brother        testicular cancer   Hyperlipidemia Sister    Meniere's disease Sister    Social History   Socioeconomic History   Marital status: Single    Spouse name: Not on file   Number of children: Not on file   Years of education: Not on file   Highest education level: Not on file  Occupational History   Not on file  Tobacco Use   Smoking status: Never   Smokeless tobacco: Never  Vaping Use   Vaping status: Never Used  Substance and Sexual Activity   Alcohol use: Yes    Alcohol/week: 7.0 standard drinks of alcohol    Types: 7 Cans of beer per week    Comment: 1 beer per day   Drug use: No   Sexual activity: Not Currently  Other Topics Concern   Not on file  Social History Narrative   Single, lives in 2 stories home with 3 people and 1 dog.  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, has Living Will & POA (sister)   Right handed   Social Drivers of Health   Financial Resource Strain: Low Risk  (01/02/2024)   Overall Financial Resource Strain (CARDIA)    Difficulty of Paying Living Expenses: Not hard at all  Food Insecurity: No Food Insecurity (01/02/2024)   Hunger Vital Sign    Worried About Running Out of Food in the Last Year: Never true    Ran Out of Food in the Last Year: Never true  Transportation Needs: No Transportation Needs (01/02/2024)   PRAPARE - Administrator, Civil Service (Medical): No    Lack of Transportation (Non-Medical): No  Physical Activity: Sufficiently Active (01/02/2024)   Exercise Vital Sign    Days of Exercise per Week: 3 days    Minutes of Exercise per Session: 60 min  Stress: No Stress Concern Present (01/02/2024)   [employer] of Occupational Health - Occupational Stress Questionnaire    Feeling of Stress : Not at all  Social Connections: Moderately Integrated (01/02/2024)   Social Connection and Isolation Panel [NHANES]    Frequency of Communication with Friends and Family: More than three times a week     Frequency of Social Gatherings with Friends and Family: More than three times a week    Attends Religious Services: More than 4 times per year    Active Member of [employer] or Organizations: Yes    Attends Engineer, structural: More than 4 times per year    Marital Status: Never married    Tobacco Counseling Counseling given: No    Clinical Intake:  Pre-visit preparation completed: Yes  Pain : No/denies pain  BMI - recorded: 31.62 Nutritional Status: BMI > 30  Obese Nutritional Risks: None Diabetes: Yes CBG done?: Yes CBG resulted in Enter/ Edit results?: Yes (fasting - 151) Did pt. bring in CBG monitor from home?: No  Lab Results  Component Value Date   HGBA1C 11.0 (A) 10/24/2023   HGBA1C 7.3 (H) 02/23/2023   HGBA1C 6.8 (H) 08/30/2022     How often do you need to have someone help you when you read instructions, pamphlets, or other written materials from your doctor or pharmacy?: 5 - Always (Sister assists)  Interpreter Needed?: No  Information entered by :: Kandy Orris, CMA   Activities of Daily Living     01/02/2024    9:32 AM  In your present state of health, do you have any difficulty performing the following activities:  Hearing? 1  Comment referral to an Audiologist  Vision? 0  Difficulty concentrating or making decisions? 0  Walking or climbing stairs? 0  Dressing or bathing? 0  Doing errands, shopping? 0  Preparing Food and eating ? N  Comment no cooking on stove only uses the microwave  Using the Toilet? N  In the past six months, have you accidently leaked urine? N  Do you have problems with loss of bowel control? N  Managing your Medications? Y  Comment pt/Sister  Managing your Finances? Y  Comment pt and Sister  Housekeeping or managing your Housekeeping? Y  Comment Dietitian and Sister    Patient Care Team: Elvira Hammersmith, MD as PCP - General (Internal Medicine) Devin Foerster, MD as Consulting Physician  (Ophthalmology)  I have updated your Care Teams any recent Medical Services you may have received from other providers in the past year.     Assessment:    This is a routine wellness examination for [place].  Hearing/Vision screen Hearing Screening - Comments:: Hearing defect (no hearing aids) - referral to an Audiologist Vision Screening - Comments:: Wears rx glasses - up to date with routine eye exams with Dr Hope Ly   Goals Addressed               This Visit's Progress     Patient Stated (pt-stated)        Patient and Sister stated that he needs to exercise more       Depression Screen     01/02/2024    9:36 AM 10/24/2023    9:23 AM 08/08/2023    2:05 PM 02/27/2023    9:52 AM 12/16/2022    9:09 AM 12/16/2022    9:06 AM 12/12/2022    3:56 PM  PHQ 2/9 Scores  PHQ - 2 Score 0 0 0 0 0 0 0  PHQ- 9 Score 1          Fall Risk     01/02/2024    9:40 AM 10/24/2023    9:23 AM 08/08/2023    2:05 PM 02/27/2023    9:52 AM 12/16/2022    9:06 AM  Fall Risk   Falls in the past year? 0 0 0 0 0  Number falls in past yr: 0 0 0 0 0  Injury with Fall? 0 0 0 0 0  Risk for fall due to : No Fall Risks No Fall Risks No Fall Risks No Fall Risks No Fall Risks  Follow up Falls evaluation completed;Falls prevention discussed Falls evaluation completed Falls evaluation completed Falls evaluation completed Falls evaluation completed    MEDICARE RISK AT HOME:  Medicare  Risk at Home Any stairs in or around the home?: No If so, are there any without handrails?: No Home free of loose throw rugs in walkways, pet beds, electrical cords, etc?: Yes Adequate lighting in your home to reduce risk of falls?: Yes Life alert?: No Use of a cane, walker or w/c?: Yes (cane) Grab bars in the bathroom?: Yes Shower chair or bench in shower?: Yes Elevated toilet seat or a handicapped toilet?: Yes  TIMED UP AND GO:  Was the test performed?  No  Cognitive Function: 6CIT completed    05/29/2020   10:01 AM  05/28/2019   10:05 AM 05/22/2018    8:36 AM 09/19/2016    8:52 AM 08/13/2015    1:15 PM  MMSE - Mini Mental State Exam  Orientation to time 5 5 5 5 5   Orientation to time comments 2021, Fall, 05/29/2020, Friday, October.      Orientation to Place 4 5 4 5 4   Orientation to Place-comments State: [place], [place]: [place], [place]: [place].      Registration 3 3 3 3 3   Attention/ Calculation 4 0 5 1 5   Attention/Calculation-comments DROW      Recall 1 1 2 3 3   Recall-comments Only remembered "[employer]"      Language- name 2 objects 2 2 2 2 2   Language- repeat 1 1 1 1 1   Language- follow 3 step command 3 2 3 3 3   Language- read & follow direction 0 1 1 1 1   Language-read & follow direction-comments Only closed Eyes and didn't read it.      Write a sentence 1 1 1 1 1   Copy design 1 1 0 0 0  Total score 25 22 27 25 28         01/02/2024    9:44 AM 12/16/2022    9:18 AM  6CIT Screen  What Year? 0 points 0 points  What month? 0 points 0 points  What time? 0 points 0 points  Count back from 20 0 points 0 points  Months in reverse 0 points 0 points  Repeat phrase 0 points 0 points  Total Score 0 points 0 points    Immunizations Immunization History  Administered Date(s) Administered   Fluad Quad(high Dose 65+) 03/25/2019, 05/14/2020, 05/01/2022   Influenza, High Dose Seasonal PF 04/27/2017, 04/30/2018   Influenza,inj,Quad PF,6+ Mos 05/14/2015, 06/03/2016   Influenza-Unspecified 05/26/2011, 08/15/2012, 05/01/2013, 05/02/2023   PFIZER Comirnaty(Gray Top)Covid-19 Tri-Sucrose Vaccine 08/22/2019, 09/12/2019, 05/19/2020   Pneumococcal Conjugate-13 08/13/2015   Pneumococcal Polysaccharide-23 03/12/2009   Tdap 02/07/2013   Unspecified SARS-COV-2 Vaccination 05/02/2023   Zoster, Live 01/03/2016    Screening Tests Health Maintenance  Topic Date Due   Zoster Vaccines- Shingrix (1 of 2) 06/24/1990   DTaP/Tdap/Td (2 - Td or Tdap) 02/08/2023   COVID-19 Vaccine (5 - 2024-25  season) 10/31/2023   INFLUENZA VACCINE  03/01/2024   HEMOGLOBIN A1C  04/25/2024   FOOT EXAM  06/12/2024   Diabetic kidney evaluation - eGFR measurement  10/23/2024   Diabetic kidney evaluation - Urine ACR  10/23/2024   OPHTHALMOLOGY EXAM  12/17/2024   Medicare Annual Wellness (AWV)  01/01/2025   Pneumonia Vaccine 13+ Years old  Completed   HPV VACCINES  Aged Out   Meningococcal B Vaccine  Aged Out   Hepatitis C Screening  Discontinued    Health Maintenance  Health Maintenance Due  Topic Date Due   Zoster Vaccines- Shingrix (1 of 2) 06/24/1990   DTaP/Tdap/Td (2 - Td  or Tdap) 02/08/2023   COVID-19 Vaccine (5 - 2024-25 season) 10/31/2023   Health Maintenance Items Addressed: Referral to an Audiologist- been >40yrs since last hearing exam  Additional Screening:  Vision Screening: Recommended annual ophthalmology exams for early detection of glaucoma and other disorders of the eye. Would you like a referral to an eye doctor? No    Dental Screening: Recommended annual dental exams for proper oral hygiene  Community Resource Referral / Chronic Care Management: CRR required this visit?  No   CCM required this visit?  No   Plan:    I have personally reviewed and noted the following in the patient's chart:   Medical and social history Use of alcohol, tobacco or illicit drugs  Current medications and supplements including opioid prescriptions. Patient is not currently taking opioid prescriptions. Functional ability and status Nutritional status Physical activity Advanced directives List of other physicians Hospitalizations, surgeries, and ER visits in previous 12 months Vitals Screenings to include cognitive, depression, and falls Referrals and appointments  In addition, I have reviewed and discussed with patient certain preventive protocols, quality metrics, and best practice recommendations. A written personalized care plan for preventive services as well as general  preventive health recommendations were provided to patient.   Patria Bookbinder, CMA   01/02/2024   After Visit Summary: (MyChart) Due to this being a telephonic visit, the after visit summary with patients personalized plan was offered to patient via MyChart   Notes: Nothing significant to report at this time.

## 2024-01-03 DIAGNOSIS — H01134 Eczematous dermatitis of left upper eyelid: Secondary | ICD-10-CM | POA: Diagnosis not present

## 2024-01-03 DIAGNOSIS — H01135 Eczematous dermatitis of left lower eyelid: Secondary | ICD-10-CM | POA: Diagnosis not present

## 2024-01-03 DIAGNOSIS — H0102B Squamous blepharitis left eye, upper and lower eyelids: Secondary | ICD-10-CM | POA: Diagnosis not present

## 2024-01-03 DIAGNOSIS — H01132 Eczematous dermatitis of right lower eyelid: Secondary | ICD-10-CM | POA: Diagnosis not present

## 2024-01-03 DIAGNOSIS — H16143 Punctate keratitis, bilateral: Secondary | ICD-10-CM | POA: Diagnosis not present

## 2024-01-03 DIAGNOSIS — H01131 Eczematous dermatitis of right upper eyelid: Secondary | ICD-10-CM | POA: Diagnosis not present

## 2024-01-03 DIAGNOSIS — H2513 Age-related nuclear cataract, bilateral: Secondary | ICD-10-CM | POA: Diagnosis not present

## 2024-01-03 DIAGNOSIS — E119 Type 2 diabetes mellitus without complications: Secondary | ICD-10-CM | POA: Diagnosis not present

## 2024-01-03 DIAGNOSIS — H0102A Squamous blepharitis right eye, upper and lower eyelids: Secondary | ICD-10-CM | POA: Diagnosis not present

## 2024-01-11 ENCOUNTER — Encounter: Payer: Self-pay | Admitting: Podiatry

## 2024-01-11 ENCOUNTER — Ambulatory Visit: Admitting: Podiatry

## 2024-01-11 DIAGNOSIS — M79675 Pain in left toe(s): Secondary | ICD-10-CM | POA: Diagnosis not present

## 2024-01-11 DIAGNOSIS — E1122 Type 2 diabetes mellitus with diabetic chronic kidney disease: Secondary | ICD-10-CM | POA: Diagnosis not present

## 2024-01-11 DIAGNOSIS — M79674 Pain in right toe(s): Secondary | ICD-10-CM

## 2024-01-11 DIAGNOSIS — N182 Chronic kidney disease, stage 2 (mild): Secondary | ICD-10-CM | POA: Diagnosis not present

## 2024-01-11 DIAGNOSIS — B351 Tinea unguium: Secondary | ICD-10-CM

## 2024-01-11 NOTE — Progress Notes (Signed)
  Subjective:  Patient ID: Justin [name], male    DOB: 01-27-1941,  MRN: [id number]  83 y.o. male presents to clinic with  at risk foot care. Pt has h/o NIDDM with chronic kidney disease and painful, elongated thickened toenails x 10 which are symptomatic when wearing enclosed shoe gear. This interferes with his/her daily activities.  Chief Complaint  Patient presents with   Nail Problem    RFC   New problem(s): None   PCP is Sagardia, Isidro Margo, MD.  No Known Allergies  Review of Systems: Negative except as noted in the HPI.   Objective:  Justin [name] is a pleasant 83 y.o. male in NAD. AAO x 3.  Vascular Examination: Vascular status intact b/l with palpable pedal pulses. CFT immediate b/l. No edema. No pain with calf compression b/l. Skin temperature gradient WNL b/l. Pedal hair absent. No cyanosis or clubbing noted b/l LE.  Neurological Examination: Sensation grossly intact b/l with 10 gram monofilament. Vibratory sensation intact b/l.   Dermatological Examination: Pedal skin with normal turgor, texture and tone b/l. Toenails 1-5 b/l thick, discolored, elongated with subungual debris and pain on dorsal palpation. No hyperkeratotic lesions noted b/l.   Musculoskeletal Examination: Muscle strength 5/5 to b/l LE. HAV with bunion deformity noted b/l LE.  Radiographs: None  Last A1c:      Latest Ref Rng & Units 10/24/2023   11:54 AM 02/23/2023    8:13 AM  Hemoglobin A1C  Hemoglobin-A1c 4.0 - 5.6 % 11.0  7.3    Assessment:   1. 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 Type 2 diabetes mellitus with stage 2 chronic kidney disease, without long-term current use of insulin (HCC)    Plan:  Consent given for treatment. Patient examined. All patient's and/or POA's questions/concerns addressed on today's visit. Mycotic toenails 1-5 debrided in length and girth without incident. Continue foot and shoe inspections daily. Monitor blood glucose per  PCP/Endocrinologist's recommendations.Continue soft, supportive shoe gear daily. Report any pedal injuries to medical professional. Call office if there are any quesitons/concerns. -Patient/POA to call should there be question/concern in the interim.  Return in about 3 months (around 04/12/2024) for nail care.  Ruffin Cotton, DPM      [place] LOCATION: [address] [place] [address]                   Office [phone]   [facility] LOCATION: [address] [place], [place] [address] Office [phone]

## 2024-01-18 ENCOUNTER — Ambulatory Visit: Attending: Emergency Medicine | Admitting: Audiology

## 2024-01-18 DIAGNOSIS — H903 Sensorineural hearing loss, bilateral: Secondary | ICD-10-CM | POA: Diagnosis not present

## 2024-01-19 NOTE — Procedures (Signed)
 Outpatient Audiology and [facility] [address] [place], [place]  [address] [phone]  AUDIOLOGICAL  EVALUATION  NAME: Justin [name]     DOB:   [birth year]      MRN: [id number]                                                                                     DATE: 01/19/2024     REFERENT: Elvira Hammersmith, MD STATUS: Outpatient DIAGNOSIS: sensorineural hearing loss, bilateral    History: Justin [name] was seen for an audiological evaluation due to concerns regarding his hearing sensitivity.  Justin [name] was accompanied to the appointment by his sister.  Justin [name] sister reports Justin [name] has had hearing loss for many years and struggles greatly to hear in every listening environment.  He denies otalgia, aural fullness, and tinnitus.  Justin [name] has previously seen audiology and ENT at [facility] on 12/27/2018.  The audiological evaluation at this appointment showed in the right ear a moderately severe to profound primarily sensorineural hearing loss with a mixed component and showed in the left ear and mild sloping to profound mixed hearing loss.  At this appointment ENT recommended hearing aids or cochlear implant evaluation.  It was reported Justin [name] was not interested in pursuing either at that time.  Justin [name]'s sister reports that Justin [name] has the most significant difficulty hearing the television and turns the television up very loudly.  Evaluation:  Otoscopy showed a clear view of the tympanic membranes, bilaterally Tympanometry results were consistent with normal tympanic membrane mobility in both ears. Audiometric testing was completed using Conventional Audiometry techniques with insert earphones and TDH headphones. Test results are consistent in the left ear with a mild sloping to profound sensorineural hearing loss and in the right ear with a severe sloping to profound hearing loss.  Mast bone-conduction testing was attempted however Justin [name] did not understand the masking and could  not complete the task.  Speech Recognition Thresholds were obtained at 100 dB HL in the right ear and at 55 dB HL in the left ear.  A speech detection threshold was obtained at 75 dB HL in the right ear.  Word Recognition Testing was completed at 100 dB HL and Justin [name] scored 24% in the right ear and was completed at 85 dB HL and Justin [name] scored 84% in the left ear.  Asymmetric hearing loss noted at 250 to 2000 Hz, worse in the right ear.  Asymmetric word recognition noted, worse in the right ear.  Results:  The test results were reviewed with Justin [name] and his sister. Test results are consistent in the left ear with a mild sloping to profound sensorineural hearing loss and in the right ear with a severe sloping to profound hearing loss.  Justin [name] will have hearing and communication difficulty in all listening environments.  He will benefit from the use of good communication strategies and the use of hearing aids or amplification.  Justin [name] sister reports that Justin [name] has dexterity issues with his fingers and will not be able to put hearing aids in and out of his ears.  Justin [name] was shown a pocket talker in the pocket talker was demonstrated for the  family.  The use of headphones for the TV was reviewed and Brenen his sister were given a few headphone recommendations for the television.  Justin [name] was given a copy of his audiogram at today's evaluation.  Justin [name] was encouraged to follow-up with the ear nose and throat physician as needed.  Recommendations: 1.   Use of hearing aids or personal amplification device such as a pocket talker if motivated 2.   Use of headphones for television  3.   Monitor hearing sensitivity  4.   Follow up with ENT as needed   30 minutes spent testing and counseling on results.   If you have any questions please feel free to contact me at [phone].  Justin [name] Audiologist, Au.D., CCC-A 01/19/2024  9:39 AM  Cc: [name], Justin Jose, MD

## 2024-01-24 ENCOUNTER — Ambulatory Visit (INDEPENDENT_AMBULATORY_CARE_PROVIDER_SITE_OTHER): Admitting: Emergency Medicine

## 2024-01-24 ENCOUNTER — Encounter: Payer: Self-pay | Admitting: Emergency Medicine

## 2024-01-24 VITALS — BP 126/72 | HR 74 | Temp 97.7°F | Ht 64.0 in | Wt 184.0 lb

## 2024-01-24 DIAGNOSIS — E1159 Type 2 diabetes mellitus with other circulatory complications: Secondary | ICD-10-CM | POA: Diagnosis not present

## 2024-01-24 DIAGNOSIS — E1169 Type 2 diabetes mellitus with other specified complication: Secondary | ICD-10-CM

## 2024-01-24 DIAGNOSIS — E1165 Type 2 diabetes mellitus with hyperglycemia: Secondary | ICD-10-CM

## 2024-01-24 DIAGNOSIS — N1832 Chronic kidney disease, stage 3b: Secondary | ICD-10-CM | POA: Diagnosis not present

## 2024-01-24 DIAGNOSIS — E785 Hyperlipidemia, unspecified: Secondary | ICD-10-CM

## 2024-01-24 DIAGNOSIS — I152 Hypertension secondary to endocrine disorders: Secondary | ICD-10-CM | POA: Diagnosis not present

## 2024-01-24 DIAGNOSIS — H6123 Impacted cerumen, bilateral: Secondary | ICD-10-CM

## 2024-01-24 DIAGNOSIS — Z7984 Long term (current) use of oral hypoglycemic drugs: Secondary | ICD-10-CM | POA: Diagnosis not present

## 2024-01-24 LAB — POCT GLYCOSYLATED HEMOGLOBIN (HGB A1C)

## 2024-01-24 NOTE — Assessment & Plan Note (Signed)
 BP Readings from Last 3 Encounters:  01/24/24 126/72  01/02/24 130/70  10/24/23 124/74  Well-controlled hypertension off medications. Much improved hemoglobin A1c at 7.3.  At goal. Continue Jardiance  25 mg daily, glipizide  5 mg in the morning and Januvia  100 mg daily. Cardiovascular risks associated with uncontrolled diabetes discussed Diet and nutrition discussed We will follow-up in 6 months

## 2024-01-24 NOTE — Assessment & Plan Note (Signed)
 Advised to stay well-hydrated and avoid NSAIDs Continue Jardiance  to 25 mg daily

## 2024-01-24 NOTE — Progress Notes (Signed)
 Justin [name] 83 y.o.   Chief Complaint  Patient presents with   Follow-up    Patient here for 3 month f/u for DM / HTN. Patient needing both ears cleaned. Patient had an infection in his gums and is on abx currently.     HISTORY OF PRESENT ILLNESS: This is a 83 y.o. male here for 41-month follow-up of diabetes and hypertension Needs irrigation of both ears due to wax buildup Had teeth extraction last week.  Was started on antibiotics.  No diarrhea. No other complaint or medical concerns today  Lab Results  Component Value Date   HGBA1C 11.0 (A) 10/24/2023   Wt Readings from Last 3 Encounters:  01/24/24 184 lb (83.5 kg)  01/02/24 184 lb 3.2 oz (83.6 kg)  10/24/23 183 lb (83 kg)   BP Readings from Last 3 Encounters:  01/24/24 126/72  01/02/24 130/70  10/24/23 124/74     HPI   Prior to Admission medications   Medication Sig Start Date End Date Taking? Authorizing Provider  amoxicillin (AMOXIL) 500 MG capsule Take 1,000 mg by mouth 2 (two) times daily. 01/18/24  Yes [provider]  docusate sodium  (COLACE) 100 MG capsule Take 100 mg by mouth daily.   Yes [provider]  empagliflozin  (JARDIANCE ) 25 MG TABS tablet Take 1 tablet (25 mg total) by mouth daily before breakfast. 10/24/23  Yes Rossie Bretado, Emil Schanz, MD  finasteride  (PROSCAR ) 5 MG tablet Take 1 tablet (5 mg total) by mouth daily. 12/16/22  Yes Lenka Zhao, Emil Schanz, MD  glipiZIDE  (GLUCOTROL ) 5 MG tablet Take 1 tablet (5 mg total) by mouth daily before breakfast. 10/24/23  Yes Quinzell Malcomb, Emil Schanz, MD  Lancets [facility] DELICA PLUS [place]) MISC USE UP TO 4 TIMES A DAY AS DIRECTED 12/01/21  Yes Reed, Tiffany L, DO  ONETOUCH ULTRA TEST test strip USE TO CHECK BLOODS SUGAR LEVELS DAILY AS DIRECTED FOR DIABETES 12/04/23  Yes Diavian Furgason, Emil Schanz, MD  rosuvastatin  (CRESTOR ) 20 MG tablet Take 1 tablet (20 mg total) by mouth daily. 12/16/22  Yes Sadarius Norman, Emil Schanz, MD  sitaGLIPtin  (JANUVIA ) 100 MG tablet  Take 1 tablet (100 mg total) by mouth daily. 02/27/23  Yes Christie Copley, Emil Schanz, MD  tamsulosin  (FLOMAX ) 0.4 MG CAPS capsule Take 1 capsule (0.4 mg total) by mouth daily. 02/27/23  Yes Purcell Emil Schanz, MD  triamcinolone cream (KENALOG) 0.1 % SMARTSIG:1 Application Topical 2-3 Times Daily 02/23/23  Yes [provider]    No Known Allergies  Patient Active Problem List   Diagnosis Date Noted   Uncontrolled type 2 diabetes mellitus with hyperglycemia (HCC) 10/24/2023   Stage 3b chronic kidney disease (HCC) 08/30/2022   Hypertension associated with diabetes (HCC) 08/30/2022   Splenic artery aneurysm (HCC) 06/10/2019   Hearing loss of both ears due to cerumen impaction 06/10/2019   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   Seasonal allergies 03/25/2019   Tubular adenoma of colon 03/25/2014   Dyslipidemia associated with type 2 diabetes mellitus (HCC) 03/25/2014   Anemia, iron deficiency 03/25/2014   Essential hypertension, benign 03/25/2014   Hyperlipidemia 03/25/2014   Personal history of colon cancer, stage II 03/25/2014   Prostate nodule without urinary obstruction 03/25/2014    Past Medical History:  Diagnosis Date   Arthritis    Chronic kidney disease    CKD- Stage  II   Colon cancer (HCC)    2014   Colon polyps    adenomatous   Diabetes mellitus without complication (HCC)  Eczema    Hypercholesteremia    Hypertension     Past Surgical History:  Procedure Laterality Date   COLON SURGERY  2014 & 2015   resection of colon & removal of poylp ; Dr Clive, Dr Rick   COLONOSCOPY  09/29/2013   COLONOSCOPY WITH PROPOFOL  N/A 03/10/2015   Procedure: COLONOSCOPY WITH PROPOFOL ;  Surgeon: Lamar JONETTA Aho, MD;  Location: [facility];  Service: Endoscopy;  Laterality: N/A;   EXTERNAL EAR SURGERY  1954   FLEXIBLE SIGMOIDOSCOPY N/A 07/21/2014   Procedure: FLEXIBLE SIGMOIDOSCOPY;  Surgeon: Lamar JONETTA Aho, MD;  Location: [facility];  Service:  Endoscopy;  Laterality: N/A;    Social History   Socioeconomic History   Marital status: Single    Spouse name: Not on file   Number of children: Not on file   Years of education: Not on file   Highest education level: Not on file  Occupational History   Not on file  Tobacco Use   Smoking status: Never   Smokeless tobacco: Never  Vaping Use   Vaping status: Never Used  Substance and Sexual Activity   Alcohol use: Yes    Alcohol/week: 7.0 standard drinks of alcohol    Types: 7 Cans of beer per week    Comment: 1 beer per day   Drug use: No   Sexual activity: Not Currently  Other Topics Concern   Not on file  Social History Narrative   Single, lives in 2 stories home with 3 people and 1 dog.  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, has Living Will & POA (sister)   Right handed   Social Drivers of Health   Financial Resource Strain: Low Risk  (01/02/2024)   Overall Financial Resource Strain (CARDIA)    Difficulty of Paying Living Expenses: Not hard at all  Food Insecurity: No Food Insecurity (01/02/2024)   Hunger Vital Sign    Worried About Running Out of Food in the Last Year: Never true    Ran Out of Food in the Last Year: Never true  Transportation Needs: No Transportation Needs (01/02/2024)   PRAPARE - Administrator, Civil Service (Medical): No    Lack of Transportation (Non-Medical): No  Physical Activity: Sufficiently Active (01/02/2024)   Exercise Vital Sign    Days of Exercise per Week: 3 days    Minutes of Exercise per Session: 60 min  Stress: No Stress Concern Present (01/02/2024)   [employer] of Occupational Health - Occupational Stress Questionnaire    Feeling of Stress : Not at all  Social Connections: Moderately Integrated (01/02/2024)   Social Connection and Isolation Panel    Frequency of Communication with Friends and Family: More than three times a week    Frequency of Social Gatherings with Friends and Family: More  than three times a week    Attends Religious Services: More than 4 times per year    Active Member of [employer] or Organizations: Yes    Attends Banker Meetings: More than 4 times per year    Marital Status: Never married  Intimate Partner Violence: Not At Risk (01/02/2024)   Humiliation, Afraid, Rape, and Kick questionnaire    Fear of Current or Ex-Partner: No    Emotionally Abused: No    Physically Abused: No    Sexually Abused: No    Family History  Problem Relation Age of Onset   Varicose Veins Mother    Stroke Mother  Glaucoma Mother    Kidney disease Mother    Hypertension Father    Heart attack Father    Cancer Brother        testicular cancer   Hyperlipidemia Sister    Meniere's disease Sister      Review of Systems  Constitutional: Negative.  Negative for chills and fever.  HENT:  Positive for hearing loss. Negative for congestion and sore throat.   Respiratory: Negative.  Negative for cough and shortness of breath.   Cardiovascular: Negative.  Negative for chest pain and palpitations.  Gastrointestinal:  Negative for abdominal pain, nausea and vomiting.  Skin: Negative.  Negative for rash.  Neurological: Negative.  Negative for dizziness and headaches.  All other systems reviewed and are negative.   Vitals:   01/24/24 0846  BP: 126/72  Pulse: 74  Temp: 97.7 F (36.5 C)  SpO2: 95%    Physical Exam Vitals reviewed.  Constitutional:      Appearance: Normal appearance.  HENT:     Head: Normocephalic.     Right Ear: There is impacted cerumen.     Left Ear: There is impacted cerumen.   Cardiovascular:     Rate and Rhythm: Normal rate and regular rhythm.     Pulses: Normal pulses.     Heart sounds: Normal heart sounds.  Pulmonary:     Effort: Pulmonary effort is normal.     Breath sounds: Normal breath sounds.   Musculoskeletal:     Cervical back: No tenderness.  Lymphadenopathy:     Cervical: No cervical adenopathy.   Skin:     General: Skin is warm and dry.     Capillary Refill: Capillary refill takes less than 2 seconds.   Neurological:     General: No focal deficit present.     Mental Status: He is alert and oriented to person, place, and time.   Psychiatric:        Mood and Affect: Mood normal.        Behavior: Behavior normal.     Procedure note: After obtaining verbal consent from patient and family member both ears were irrigated with saline solution and wax buildup was successfully removed without complications.  Tolerated procedure well.  Exam post procedure shows intact tympanic membranes bilaterally.  ASSESSMENT & PLAN: A total of 46 minutes was spent with the patient and counseling/coordination of care regarding preparing for this visit, review of most recent office visit notes, review of multiple chronic medical conditions and their management, cardiovascular risks associated with uncontrolled diabetes, review of all medications, review of most recent bloodwork results including interpretation of today's hemoglobin A1c, review of health maintenance items, education on nutrition, prognosis, documentation, and need for follow up.   Problem List Items Addressed This Visit       Cardiovascular and Mediastinum   Hypertension associated with diabetes (HCC) - Primary   BP Readings from Last 3 Encounters:  01/24/24 126/72  01/02/24 130/70  10/24/23 124/74  Well-controlled hypertension off medications. Much improved hemoglobin A1c at 7.3.  At goal. Continue Jardiance  25 mg daily, glipizide  5 mg in the morning and Januvia  100 mg daily. Cardiovascular risks associated with uncontrolled diabetes discussed Diet and nutrition discussed We will follow-up in 6 months         Endocrine   Dyslipidemia associated with type 2 diabetes mellitus (HCC)   Chronic stable conditions Much improved hemoglobin A1c at 7.3 Continue Jardiance , glipizide , and Januvia  as detailed above Diet and nutrition  discussed  Continue rosuvastatin  20 mg daily Follow-up in 6 months      Uncontrolled type 2 diabetes mellitus with hyperglycemia (HCC)   Relevant Orders   POCT HgB A1C (Completed)     Nervous and Auditory   Hearing loss of both ears due to cerumen impaction   Both ears irrigated and  obstruction relieved Earwax irrigated out bilaterally Hearing improved after procedure. No complications.  Tolerated procedure well.        Genitourinary   Stage 3b chronic kidney disease (HCC)   Advised to stay well-hydrated and avoid NSAIDs Continue Jardiance  to 25 mg daily      Patient Instructions  Health Maintenance After Age 73 After age 35, you are at a higher risk for certain long-term diseases and infections as well as injuries from falls. Falls are a major cause of broken bones and head injuries in people who are older than age 35. Getting regular preventive care can help to keep you healthy and well. Preventive care includes getting regular testing and making lifestyle changes as recommended by your health care provider. Talk with your health care provider about: Which screenings and tests you should have. A screening is a test that checks for a disease when you have no symptoms. A diet and exercise plan that is right for you. What should I know about screenings and tests to prevent falls? Screening and testing are the best ways to find a health problem early. Early diagnosis and treatment give you the best chance of managing medical conditions that are common after age 68. Certain conditions and lifestyle choices may make you more likely to have a fall. Your health care provider may recommend: Regular vision checks. Poor vision and conditions such as cataracts can make you more likely to have a fall. If you wear glasses, make sure to get your prescription updated if your vision changes. Medicine review. Work with your health care provider to regularly review all of the medicines you are  taking, including over-the-counter medicines. Ask your health care provider about any side effects that may make you more likely to have a fall. Tell your health care provider if any medicines that you take make you feel dizzy or sleepy. Strength and balance checks. Your health care provider may recommend certain tests to check your strength and balance while standing, walking, or changing positions. Foot health exam. Foot pain and numbness, as well as not wearing proper footwear, can make you more likely to have a fall. Screenings, including: Osteoporosis screening. Osteoporosis is a condition that causes the bones to get weaker and break more easily. Blood pressure screening. Blood pressure changes and medicines to control blood pressure can make you feel dizzy. Depression screening. You may be more likely to have a fall if you have a fear of falling, feel depressed, or feel unable to do activities that you used to do. Alcohol use screening. Using too much alcohol can affect your balance and may make you more likely to have a fall. Follow these instructions at home: Lifestyle Do not drink alcohol if: Your health care provider tells you not to drink. If you drink alcohol: Limit how much you have to: 0-1 drink a day for women. 0-2 drinks a day for men. Know how much alcohol is in your drink. In the [place] one drink equals one 12 oz bottle of beer (355 mL), one 5 oz glass of wine (148 mL), or one 1 oz glass of hard liquor (44 mL). Do not use  any products that contain nicotine or tobacco. These products include cigarettes, chewing tobacco, and vaping devices, such as e-cigarettes. If you need help quitting, ask your health care provider. Activity  Follow a regular exercise program to stay fit. This will help you maintain your balance. Ask your health care provider what types of exercise are appropriate for you. If you need a cane or walker, use it as recommended by your health care provider. Wear  supportive shoes that have nonskid soles. Safety  Remove any tripping hazards, such as rugs, cords, and clutter. Install safety equipment such as grab bars in bathrooms and safety rails on stairs. Keep rooms and walkways well-lit. General instructions Talk with your health care provider about your risks for falling. Tell your health care provider if: You fall. Be sure to tell your health care provider about all falls, even ones that seem minor. You feel dizzy, tiredness (fatigue), or off-balance. Take over-the-counter and prescription medicines only as told by your health care provider. These include supplements. Eat a healthy diet and maintain a healthy weight. A healthy diet includes low-fat dairy products, low-fat (lean) meats, and fiber from whole grains, beans, and lots of fruits and vegetables. Stay current with your vaccines. Schedule regular health, dental, and eye exams. Summary Having a healthy lifestyle and getting preventive care can help to protect your health and wellness after age 55. Screening and testing are the best way to find a health problem early and help you avoid having a fall. Early diagnosis and treatment give you the best chance for managing medical conditions that are more common for people who are older than age 16. Falls are a major cause of broken bones and head injuries in people who are older than age 48. Take precautions to prevent a fall at home. Work with your health care provider to learn what changes you can make to improve your health and wellness and to prevent falls. This information is not intended to replace advice given to you by your health care provider. Make sure you discuss any questions you have with your health care provider. Document Revised: 12/07/2020 Document Reviewed: 12/07/2020 Elsevier Patient Education  2024 Elsevier Inc.         Emil Schaumann, MD [facility]

## 2024-01-24 NOTE — Assessment & Plan Note (Signed)
 Chronic stable conditions Much improved hemoglobin A1c at 7.3 Continue Jardiance , glipizide , and Januvia  as detailed above Diet and nutrition discussed Continue rosuvastatin  20 mg daily Follow-up in 6 months

## 2024-01-24 NOTE — Patient Instructions (Signed)
 Health Maintenance After Age 84 After age 4, you are at a higher risk for certain long-term diseases and infections as well as injuries from falls. Falls are a major cause of broken bones and head injuries in people who are older than age 47. Getting regular preventive care can help to keep you healthy and well. Preventive care includes getting regular testing and making lifestyle changes as recommended by your health care provider. Talk with your health care provider about: Which screenings and tests you should have. A screening is a test that checks for a disease when you have no symptoms. A diet and exercise plan that is right for you. What should I know about screenings and tests to prevent falls? Screening and testing are the best ways to find a health problem early. Early diagnosis and treatment give you the best chance of managing medical conditions that are common after age 37. Certain conditions and lifestyle choices may make you more likely to have a fall. Your health care provider may recommend: Regular vision checks. Poor vision and conditions such as cataracts can make you more likely to have a fall. If you wear glasses, make sure to get your prescription updated if your vision changes. Medicine review. Work with your health care provider to regularly review all of the medicines you are taking, including over-the-counter medicines. Ask your health care provider about any side effects that may make you more likely to have a fall. Tell your health care provider if any medicines that you take make you feel dizzy or sleepy. Strength and balance checks. Your health care provider may recommend certain tests to check your strength and balance while standing, walking, or changing positions. Foot health exam. Foot pain and numbness, as well as not wearing proper footwear, can make you more likely to have a fall. Screenings, including: Osteoporosis screening. Osteoporosis is a condition that causes  the bones to get weaker and break more easily. Blood pressure screening. Blood pressure changes and medicines to control blood pressure can make you feel dizzy. Depression screening. You may be more likely to have a fall if you have a fear of falling, feel depressed, or feel unable to do activities that you used to do. Alcohol use screening. Using too much alcohol can affect your balance and may make you more likely to have a fall. Follow these instructions at home: Lifestyle Do not drink alcohol if: Your health care provider tells you not to drink. If you drink alcohol: Limit how much you have to: 0-1 drink a day for women. 0-2 drinks a day for men. Know how much alcohol is in your drink. In the [place] one drink equals one 12 oz bottle of beer (355 mL), one 5 oz glass of wine (148 mL), or one 1 oz glass of hard liquor (44 mL). Do not use any products that contain nicotine or tobacco. These products include cigarettes, chewing tobacco, and vaping devices, such as e-cigarettes. If you need help quitting, ask your health care provider. Activity  Follow a regular exercise program to stay fit. This will help you maintain your balance. Ask your health care provider what types of exercise are appropriate for you. If you need a cane or walker, use it as recommended by your health care provider. Wear supportive shoes that have nonskid soles. Safety  Remove any tripping hazards, such as rugs, cords, and clutter. Install safety equipment such as grab bars in bathrooms and safety rails on stairs. Keep rooms and walkways  well-lit. General instructions Talk with your health care provider about your risks for falling. Tell your health care provider if: You fall. Be sure to tell your health care provider about all falls, even ones that seem minor. You feel dizzy, tiredness (fatigue), or off-balance. Take over-the-counter and prescription medicines only as told by your health care provider. These include  supplements. Eat a healthy diet and maintain a healthy weight. A healthy diet includes low-fat dairy products, low-fat (lean) meats, and fiber from whole grains, beans, and lots of fruits and vegetables. Stay current with your vaccines. Schedule regular health, dental, and eye exams. Summary Having a healthy lifestyle and getting preventive care can help to protect your health and wellness after age 11. Screening and testing are the best way to find a health problem early and help you avoid having a fall. Early diagnosis and treatment give you the best chance for managing medical conditions that are more common for people who are older than age 28. Falls are a major cause of broken bones and head injuries in people who are older than age 48. Take precautions to prevent a fall at home. Work with your health care provider to learn what changes you can make to improve your health and wellness and to prevent falls. This information is not intended to replace advice given to you by your health care provider. Make sure you discuss any questions you have with your health care provider. Document Revised: 12/07/2020 Document Reviewed: 12/07/2020 Elsevier Patient Education  2024 [employer].

## 2024-01-24 NOTE — Assessment & Plan Note (Signed)
Both ears irrigated and  obstruction relieved Earwax irrigated out bilaterally Hearing improved after procedure. No complications.  Tolerated procedure well.

## 2024-02-09 ENCOUNTER — Encounter: Payer: Self-pay | Admitting: Emergency Medicine

## 2024-02-22 ENCOUNTER — Other Ambulatory Visit: Payer: Self-pay | Admitting: Emergency Medicine

## 2024-02-22 DIAGNOSIS — E782 Mixed hyperlipidemia: Secondary | ICD-10-CM

## 2024-02-26 ENCOUNTER — Other Ambulatory Visit: Payer: Self-pay | Admitting: Radiology

## 2024-02-26 DIAGNOSIS — E1165 Type 2 diabetes mellitus with hyperglycemia: Secondary | ICD-10-CM

## 2024-02-26 MED ORDER — ACCU-CHEK GUIDE TEST VI STRP: ORAL_STRIP | 12 refills | Status: AC

## 2024-02-26 MED ORDER — ACCU-CHEK GUIDE W/DEVICE KIT: PACK | 0 refills | Status: AC

## 2024-02-28 ENCOUNTER — Other Ambulatory Visit: Payer: Self-pay | Admitting: Emergency Medicine

## 2024-02-28 DIAGNOSIS — E1122 Type 2 diabetes mellitus with diabetic chronic kidney disease: Secondary | ICD-10-CM

## 2024-03-01 ENCOUNTER — Other Ambulatory Visit: Payer: Self-pay | Admitting: Emergency Medicine

## 2024-03-01 DIAGNOSIS — E1122 Type 2 diabetes mellitus with diabetic chronic kidney disease: Secondary | ICD-10-CM

## 2024-04-12 ENCOUNTER — Ambulatory Visit: Admitting: Podiatry

## 2024-04-12 ENCOUNTER — Encounter: Payer: Self-pay | Admitting: Podiatry

## 2024-04-12 DIAGNOSIS — E1122 Type 2 diabetes mellitus with diabetic chronic kidney disease: Secondary | ICD-10-CM

## 2024-04-12 DIAGNOSIS — M79675 Pain in left toe(s): Secondary | ICD-10-CM

## 2024-04-12 DIAGNOSIS — B351 Tinea unguium: Secondary | ICD-10-CM | POA: Diagnosis not present

## 2024-04-12 DIAGNOSIS — M79674 Pain in right toe(s): Secondary | ICD-10-CM | POA: Diagnosis not present

## 2024-04-12 DIAGNOSIS — N182 Chronic kidney disease, stage 2 (mild): Secondary | ICD-10-CM

## 2024-04-12 NOTE — Progress Notes (Signed)
  Subjective:  Patient ID: Justin [name], male    DOB: [birth year],  MRN: [id number]  83 y.o. male presents to clinic with  at risk foot care. Pt has h/o NIDDM with chronic kidney disease and painful, elongated thickened toenails x 10 which are symptomatic when wearing enclosed shoe gear. This interferes with his/her daily activities.  No chief complaint on file.  New problem(s): None   PCP is Sagardia, Emil Schanz, MD.  No Known Allergies  Review of Systems: Negative except as noted in the HPI.   Objective:  Justin [name] is a pleasant 83 y.o. male in NAD. AAO x 3.  Vascular Examination: Vascular status intact b/l with palpable pedal pulses. CFT immediate b/l. No edema. No pain with calf compression b/l. Skin temperature gradient WNL b/l. Pedal hair absent. No cyanosis or clubbing noted b/l LE.  Neurological Examination: Sensation grossly intact b/l with 10 gram monofilament. Vibratory sensation intact b/l.   Dermatological Examination: Pedal skin with normal turgor, texture and tone b/l. Toenails 1-5 b/l thick, discolored, elongated with subungual debris and pain on dorsal palpation. No hyperkeratotic lesions noted b/l.   Musculoskeletal Examination: Muscle strength 5/5 to b/l LE. HAV with bunion deformity noted b/l LE.  Radiographs: None  Last A1c:      Latest Ref Rng & Units 10/24/2023   11:54 AM  Hemoglobin A1C  Hemoglobin-A1c 4.0 - 5.6 % 11.0    Assessment:   1. 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 Type 2 diabetes mellitus with stage 2 chronic kidney disease, without long-term current use of insulin (HCC)    Plan:  Consent given for treatment. Patient examined. All patient's and/or POA's questions/concerns addressed on today's visit. Mycotic toenails 1-5 debrided in length and girth without incident. Continue foot and shoe inspections daily. Monitor blood glucose per PCP/Endocrinologist's recommendations.Continue soft, supportive shoe gear daily.  Report any pedal injuries to medical professional. Call office if there are any quesitons/concerns. -Patient/POA to call should there be question/concern in the interim.  Return in about 3 months (around 07/12/2024) for nail care.  Cordella Bold, DPM      [place] LOCATION: [address] [place] [address]                   Office [phone]   [facility] LOCATION: [address] [place], [place] [address] Office [phone]

## 2024-07-12 ENCOUNTER — Encounter: Payer: Self-pay | Admitting: Podiatry

## 2024-07-12 ENCOUNTER — Ambulatory Visit: Payer: Medicare (Managed Care) | Admitting: Podiatry

## 2024-07-12 DIAGNOSIS — M79675 Pain in left toe(s): Secondary | ICD-10-CM | POA: Diagnosis not present

## 2024-07-12 DIAGNOSIS — M79674 Pain in right toe(s): Secondary | ICD-10-CM

## 2024-07-12 DIAGNOSIS — N182 Chronic kidney disease, stage 2 (mild): Secondary | ICD-10-CM | POA: Diagnosis not present

## 2024-07-12 DIAGNOSIS — E1122 Type 2 diabetes mellitus with diabetic chronic kidney disease: Secondary | ICD-10-CM

## 2024-07-12 DIAGNOSIS — B351 Tinea unguium: Secondary | ICD-10-CM

## 2024-07-12 NOTE — Progress Notes (Signed)
 This patient returns to my office for at risk foot care.  This patient requires this care by a professional since this patient will be at risk due to having CKD and diabetes. This patient is unable to cut nails himself since the patient cannot reach his nails.These nails are painful walking and wearing shoes.  This patient presents for at risk foot care today.  General Appearance  Alert, conversant and in no acute stress.  Vascular  Dorsalis pedis and posterior tibial  pulses are palpable  bilaterally.  Capillary return is within normal limits  bilaterally. Temperature is within normal limits  bilaterally.  Neurologic  Senn-Weinstein monofilament wire test within normal limits  bilaterally. Muscle power within normal limits bilaterally.  Nails Thick disfigured discolored nails with subungual debris  from hallux to fifth toes bilaterally. No evidence of bacterial infection or drainage bilaterally. HAV  B/L.  Orthopedic  No limitations of motion  feet .  No crepitus or effusions noted.  No bony pathology or digital deformities noted.  Skin  normotropic skin with no porokeratosis noted bilaterally.  No signs of infections or ulcers noted.     Onychomycosis  Pain in right toes  Pain in left toes  Consent was obtained for treatment procedures.   Mechanical debridement of nails 1-5  bilaterally performed with a nail nipper.  Filed with dremel without incident.    Return office visit    3 months                  Told patient to return for periodic foot care and evaluation due to potential at risk complications.   Cordella Bold DPM

## 2024-09-04 ENCOUNTER — Encounter: Payer: Self-pay | Admitting: Internal Medicine

## 2024-10-10 ENCOUNTER — Ambulatory Visit: Payer: Medicare (Managed Care) | Admitting: Podiatry

## 2025-01-06 ENCOUNTER — Ambulatory Visit

## 2025-03-10 ENCOUNTER — Ambulatory Visit: Admitting: Physician Assistant
